# Patient Record
Sex: Female | Born: 2000 | Hispanic: Yes | Marital: Single | Smoking: Never smoker
Health system: Southern US, Community
[De-identification: ages and names within clinical notes are randomized; demographics above are authoritative.]

## PROBLEM LIST (undated history)

## (undated) DIAGNOSIS — K8020 Calculus of gallbladder without cholecystitis without obstruction: Secondary | ICD-10-CM

## (undated) DIAGNOSIS — J45909 Unspecified asthma, uncomplicated: Secondary | ICD-10-CM

## (undated) DIAGNOSIS — L309 Dermatitis, unspecified: Secondary | ICD-10-CM

## (undated) DIAGNOSIS — F419 Anxiety disorder, unspecified: Secondary | ICD-10-CM

## (undated) DIAGNOSIS — R109 Unspecified abdominal pain: Secondary | ICD-10-CM

## (undated) DIAGNOSIS — Z5189 Encounter for other specified aftercare: Secondary | ICD-10-CM

## (undated) HISTORY — DX: Unspecified abdominal pain: R10.9

## (undated) HISTORY — DX: Calculus of gallbladder without cholecystitis without obstruction: K80.20

## (undated) HISTORY — PX: TONSILLECTOMY: SUR1361

## (undated) HISTORY — PX: WISDOM TOOTH EXTRACTION: SHX21

---
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
## 2018-01-14 ENCOUNTER — Encounter (HOSPITAL_COMMUNITY): Payer: Self-pay | Admitting: Emergency Medicine

## 2018-01-14 ENCOUNTER — Ambulatory Visit (HOSPITAL_COMMUNITY)
Admission: EM | Admit: 2018-01-14 | Discharge: 2018-01-14 | Disposition: A | Discharge status: Home / Self Care | Payer: Medicaid Other | Attending: Family Medicine | Admitting: Family Medicine

## 2018-01-14 ENCOUNTER — Ambulatory Visit: Payer: No Typology Code available for payment source | Admitting: Allergy and Immunology

## 2018-01-14 ENCOUNTER — Ambulatory Visit (INDEPENDENT_AMBULATORY_CARE_PROVIDER_SITE_OTHER): Payer: Medicaid Other

## 2018-01-14 DIAGNOSIS — S6710XA Crushing injury of unspecified finger(s), initial encounter: Secondary | ICD-10-CM

## 2018-01-14 MED ORDER — IBUPROFEN 800 MG PO TABS: 800.0000 mg | ORAL_TABLET | Freq: Three times a day (TID) | ORAL | 0 refills | Status: DC | PRN

## 2018-01-14 NOTE — ED Provider Notes (Signed)
[facility]    CSN: [id number] Arrival date & time: 01/14/18  1946     History   Chief Complaint Chief Complaint  Patient presents with  . Finger Injury    HPI Ariel Mooney is a 17 y.o. female.   HPI  Third and fourth fingertips got crushed and a folding chair at the side of the pool.  Very painful.  Patient came here immediately for evaluation. Patient has fingers on ice.  Has not taken any medicine for pain.  She is otherwise healthy except for some allergies and asthma.  Past Medical History:  Diagnosis Date  . Abdominal pain, recurrent   . Anxiety   . Asthma   . Blood transfusion without reported diagnosis     Patient Active Problem List   Diagnosis Date Noted  . Other allergic rhinitis 05/22/2016  . Acute sinusitis 05/22/2016  . Moderate persistent asthma with exacerbation 01/07/2016  . Moderate persistent asthma without complication 01/07/2016  . Constipation due to slow transit 11/02/2014  . Dyspnea   . Asthma exacerbation 07/06/2014  . 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 Headache above the eye region 03/29/2011  . Lower abdominal pain     Past Surgical History:  Procedure Laterality Date  . TONSILLECTOMY    . TONSILLECTOMY  2010    OB History   None      Home Medications    Prior to Admission medications   Medication Sig Start Date End Date Taking? Authorizing Provider  albuterol (PROVENTIL HFA;VENTOLIN HFA) 108 (90 Base) MCG/ACT inhaler INHALE 2 PUFFS INTO THE LUNGS EVERY 4 HOURS AS NEEDED FOR WHEEZING OR SHORTNESS OF BREATH. 11/18/17   Bobbitt, Heywood Iles, MD  albuterol (PROVENTIL) (2.5 MG/3ML) 0.083% nebulizer solution Take 3 mLs (2.5 mg total) by nebulization every 6 (six) hours as needed for wheezing or shortness of breath. 12/17/16   Antony Madura, PA-C  cetirizine-pseudoephedrine (ZYRTEC-D) 5-120 MG tablet Take 1 tablet 2 (two) times daily by mouth. 06/28/17   Marcelyn Bruins, MD  ibuprofen  (ADVIL,MOTRIN) 800 MG tablet Take 1 tablet (800 mg total) by mouth every 8 (eight) hours as needed for moderate pain. 01/14/18   Eustace Moore, MD  mometasone-formoterol [facility]) 200-5 MCG/ACT AERO Inhale 2 puffs into the lungs 2 (two) times daily. 10/22/17   Bobbitt, Heywood Iles, MD  montelukast (SINGULAIR) 10 MG tablet Take 1 tablet (10 mg total) by mouth at bedtime. 10/22/17   Bobbitt, Heywood Iles, MD    Family History Family History  Problem Relation Age of Onset  . Asthma Sister   . Asthma Brother   . Cholelithiasis Sister   . Asthma Sister   . Asthma Sister   . Asthma Brother   . Asthma Brother   . Asthma Mother   . Ulcers Maternal Grandmother   . Cholelithiasis Maternal Grandmother   . Asthma Maternal Grandmother   . Allergic rhinitis Neg Hx   . Angioedema Neg Hx   . Eczema Neg Hx   . Immunodeficiency Neg Hx   . Urticaria Neg Hx     Social History Social History   Tobacco Use  . Smoking status: Never Smoker  . Smokeless tobacco: Never Used  Substance Use Topics  . Alcohol use: No  . Drug use: No     Allergies   Lac bovis; Lactose intolerance (gi); and Latex   Review of Systems Review of Systems  Constitutional: Negative for chills and fever.  HENT: Negative for  ear pain and sore throat.   Eyes: Negative for pain and visual disturbance.  Respiratory: Negative for cough and shortness of breath.   Cardiovascular: Negative for chest pain and palpitations.  Gastrointestinal: Negative for abdominal pain and vomiting.  Genitourinary: Negative for dysuria and hematuria.  Musculoskeletal: Negative for arthralgias and back pain.       Finger pain  Skin: Negative for color change and rash.  Neurological: Negative for seizures and syncope.  All other systems reviewed and are negative.    Physical Exam Triage Vital Signs ED Triage Vitals [01/14/18 2021]  Enc Vitals Group     BP 124/84     Pulse Rate 79     Resp 18     Temp 98.8 F (37.1 C)     Temp  Source Oral     SpO2 100 %     Weight      Height      Head Circumference      Peak Flow      Pain Score      Pain Loc      Pain Edu?      Excl. in GC?    No data found.  Updated Vital Signs BP 124/84 (BP Location: Right Arm)   Pulse 79   Temp 98.8 F (37.1 C) (Oral)   Resp 18   SpO2 100%   Visual Acuity Right Eye Distance:   Left Eye Distance:   Bilateral Distance:    Right Eye Near:   Left Eye Near:    Bilateral Near:     Physical Exam  Constitutional: She appears well-developed and well-nourished. No distress.  HENT:  Head: Normocephalic and atraumatic.  Mouth/Throat: Oropharynx is clear and moist.  Eyes: Pupils are equal, round, and reactive to light. Conjunctivae are normal.  Neck: Normal range of motion.  Cardiovascular: Normal rate.  Pulmonary/Chest: Effort normal. No respiratory distress.  Abdominal: Soft. She exhibits no distension.  Musculoskeletal: Normal range of motion. She exhibits no edema.  3rd and 4th fingertips have a minimal sub-ungual hematoma at the cuticle.  Very tender to palpation.  Patient is tearful and upset.  Sensation is normal over the tips.  She will allow minimal range of motion of these fingers at the DIP or PIP joints.  Neurological: She is alert.  Skin: Skin is warm and dry.     UC Treatments / Results  Labs (all labs ordered are listed, but only abnormal results are displayed) Labs Reviewed - No data to display  EKG None  Radiology Dg Hand Complete Left  Result Date: 01/14/2018 CLINICAL DATA:  Injury third and fourth fingers. EXAM: LEFT HAND - COMPLETE 3+ VIEW COMPARISON:  None. FINDINGS: There is no evidence of fracture or dislocation. There is no evidence of arthropathy or other focal bone abnormality. Soft tissues are unremarkable. IMPRESSION: Negative. Electronically Signed   By: Marlan Palau M.D.   On: 01/14/2018 20:50    Procedures Procedures (including critical care time)  Medications Ordered in  UC Medications - No data to display  Initial Impression / Assessment and Plan / UC Course  I have reviewed the triage vital signs and the nursing notes.  Pertinent labs & imaging results that were available during my care of the patient were reviewed by me and considered in my medical decision making (see chart for details).     Discussed possibility of nondisplaced fracture Final Clinical Impressions(s) / UC Diagnoses   Final diagnoses:  Crushing injury of distal  finger, initial encounter     Discharge Instructions     Ice Elevate Ibuprofen for pain See orthopedic in follow up    ED Prescriptions    Medication Sig Dispense Auth. Provider   ibuprofen (ADVIL,MOTRIN) 800 MG tablet Take 1 tablet (800 mg total) by mouth every 8 (eight) hours as needed for moderate pain. 30 tablet Eustace Moore, MD     Controlled Substance Prescriptions [place] Controlled Substance Registry consulted? Not Applicable   Eustace Moore, MD 01/14/18 2104

## 2018-01-14 NOTE — Discharge Instructions (Signed)
Ice Elevate Ibuprofen for pain See orthopedic in follow up

## 2018-01-14 NOTE — ED Triage Notes (Signed)
Pt sts slammed middle and right finger on left hand in chair at pool today

## 2018-01-15 ENCOUNTER — Ambulatory Visit (INDEPENDENT_AMBULATORY_CARE_PROVIDER_SITE_OTHER): Payer: Medicaid Other | Admitting: Orthopaedic Surgery

## 2018-01-15 ENCOUNTER — Encounter (INDEPENDENT_AMBULATORY_CARE_PROVIDER_SITE_OTHER): Payer: Self-pay | Admitting: Orthopaedic Surgery

## 2018-01-15 DIAGNOSIS — M79642 Pain in left hand: Secondary | ICD-10-CM

## 2018-01-15 MED ORDER — MELOXICAM 7.5 MG PO TABS: 7.5000 mg | ORAL_TABLET | Freq: Every day | ORAL | 0 refills | Status: DC | PRN

## 2018-01-15 MED ORDER — MUPIROCIN 2 % EX OINT: TOPICAL_OINTMENT | CUTANEOUS | 0 refills | Status: DC

## 2018-01-15 NOTE — Progress Notes (Signed)
Office Visit Note   Patient: Ariel Mooney           Date of Birth: [birth year]           MRN: [id number] Visit Date: 01/15/2018              Requested by: No referring provider defined for this encounter. PCP: Patient, No Pcp Per   Assessment & Plan: Visit Diagnoses:  1. Pain of left hand     Plan: Impression is small nondisplaced fracture to the distal phalanx of the left long finger.  This point, we will reapply the finger splint and buddy tape the long and ring fingers together.  We will ensure that the PIP joints are free and mobile.  We will also address this with mupirocin over the hematoma.  Prescriptions for mupirocin as well as Mobic were called into her pharmacy.  She will do dressing changes twice a day.  She will follow-up with [place] in 3 weeks time for repeat evaluation.  Call with concerns or questions in the meantime.  Follow-Up Instructions: Return in about 3 weeks (around 02/05/2018).   Orders:  No orders of the defined types were placed in this encounter.  Meds ordered this encounter  Medications  . meloxicam (MOBIC) 7.5 MG tablet    Sig: Take 1 tablet (7.5 mg total) by mouth daily as needed for pain.    Dispense:  30 tablet    Refill:  0  . mupirocin ointment (BACTROBAN) 2 %    Sig: Apply to affected area 2 times daily    Dispense:  22 g    Refill:  0      Procedures: No procedures performed   Clinical Data: No additional findings.   Subjective: Chief Complaint  Patient presents with  . Left Hand - Pain, Injury    Long and ring fingers - crush injury 01/14/18    HPI patient is a pleasant 17 year old girl who comes in today with a new injury to her left hand.  Yesterday while at the pool, she was reclining her chair when her left hand got trapped between the metal lever.  She had immediate pain and was seen at an urgent care setting.  X-rays were obtained which showed a small nondisplaced fracture to the distal phalanx of the long finger.  She  was placed in a splint and buddy taped.  She has been icing and elevating for swelling.  She comes in today for further evaluation treatment recommendation.  She does note numbness to the entire ring finger and tingling to the distal aspect of the long finger.  She also notes some bloody drainage to the long finger nail bed this morning.  No fevers, chills or any other systemic symptoms.  Review of Systems as detailed in HPI.  All others reviewed and are negative.   Objective: Vital Signs: There were no vitals taken for this visit.  Physical Exam well-developed well-nourished female no acute distress.  Alert and oriented x3.  Ortho Exam examination of her left hand shows moderate swelling to the long and ring fingers.  She does have a small subungual hematoma to the long finger.  No signs of infection.  She is able to fully extend both fingers.  Flexor tendons are intact although she is somewhat limited by pain.  Full sensation to both fingers.  Specialty Comments:  No specialty comments available.  Imaging: Dg Hand Complete Left  Result Date: 01/14/2018 CLINICAL DATA:  Injury third  and fourth fingers. EXAM: LEFT HAND - COMPLETE 3+ VIEW COMPARISON:  None. FINDINGS: There is no evidence of fracture or dislocation. There is no evidence of arthropathy or other focal bone abnormality. Soft tissues are unremarkable. IMPRESSION: Negative. Electronically Signed   By: Marlan Palau M.D.   On: 01/14/2018 20:50     PMFS History: Patient Active Problem List   Diagnosis Date Noted  . Other allergic rhinitis 05/22/2016  . Acute sinusitis 05/22/2016  . Moderate persistent asthma with exacerbation 01/07/2016  . Moderate persistent asthma without complication 01/07/2016  . Constipation due to slow transit 11/02/2014  . Dyspnea   . Asthma exacerbation 07/06/2014  . 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 Headache above the eye region 03/29/2011  . Lower abdominal pain    Past Medical  History:  Diagnosis Date  . Abdominal pain, recurrent   . Anxiety   . Asthma   . Blood transfusion without reported diagnosis     Family History  Problem Relation Age of Onset  . Asthma Sister   . Asthma Brother   . Cholelithiasis Sister   . Asthma Sister   . Asthma Sister   . Asthma Brother   . Asthma Brother   . Asthma Mother   . Ulcers Maternal Grandmother   . Cholelithiasis Maternal Grandmother   . Asthma Maternal Grandmother   . Allergic rhinitis Neg Hx   . Angioedema Neg Hx   . Eczema Neg Hx   . Immunodeficiency Neg Hx   . Urticaria Neg Hx     Past Surgical History:  Procedure Laterality Date  . TONSILLECTOMY    . TONSILLECTOMY  2010   Social History   Occupational History  . Not on file  Tobacco Use  . Smoking status: Never Smoker  . Smokeless tobacco: Never Used  Substance and Sexual Activity  . Alcohol use: No  . Drug use: No  . Sexual activity: Never

## 2018-01-16 ENCOUNTER — Ambulatory Visit: Payer: Medicaid Other | Admitting: Allergy & Immunology

## 2018-01-16 DIAGNOSIS — J309 Allergic rhinitis, unspecified: Secondary | ICD-10-CM

## 2018-01-28 ENCOUNTER — Ambulatory Visit: Payer: No Typology Code available for payment source | Admitting: Allergy and Immunology

## 2018-02-06 ENCOUNTER — Ambulatory Visit (INDEPENDENT_AMBULATORY_CARE_PROVIDER_SITE_OTHER): Payer: Medicaid Other | Admitting: Orthopaedic Surgery

## 2018-03-07 ENCOUNTER — Other Ambulatory Visit: Payer: Self-pay | Admitting: Allergy and Immunology

## 2018-03-07 DIAGNOSIS — J4550 Severe persistent asthma, uncomplicated: Secondary | ICD-10-CM

## 2018-03-10 NOTE — Telephone Encounter (Signed)
Courtesy refill  

## 2018-04-03 ENCOUNTER — Other Ambulatory Visit: Payer: Self-pay | Admitting: Allergy and Immunology

## 2018-04-03 DIAGNOSIS — J4550 Severe persistent asthma, uncomplicated: Secondary | ICD-10-CM

## 2018-04-04 NOTE — Telephone Encounter (Signed)
Courtesy refill  

## 2018-05-19 ENCOUNTER — Other Ambulatory Visit: Payer: Self-pay | Admitting: Allergy and Immunology

## 2018-05-19 DIAGNOSIS — J4550 Severe persistent asthma, uncomplicated: Secondary | ICD-10-CM

## 2018-06-17 ENCOUNTER — Other Ambulatory Visit: Payer: Self-pay | Admitting: Allergy and Immunology

## 2018-06-17 DIAGNOSIS — J4550 Severe persistent asthma, uncomplicated: Secondary | ICD-10-CM

## 2018-06-26 ENCOUNTER — Encounter: Payer: Self-pay | Admitting: Allergy

## 2018-06-26 ENCOUNTER — Ambulatory Visit (INDEPENDENT_AMBULATORY_CARE_PROVIDER_SITE_OTHER): Payer: Medicaid Other | Admitting: Allergy

## 2018-06-26 VITALS — BP 114/68 | HR 90 | Ht 61.5 in | Wt 216.0 lb

## 2018-06-26 DIAGNOSIS — J3089 Other allergic rhinitis: Secondary | ICD-10-CM | POA: Diagnosis not present

## 2018-06-26 DIAGNOSIS — J4540 Moderate persistent asthma, uncomplicated: Secondary | ICD-10-CM

## 2018-06-26 MED ORDER — ALBUTEROL SULFATE HFA 108 (90 BASE) MCG/ACT IN AERS: INHALATION_SPRAY | RESPIRATORY_TRACT | 1 refills | Status: DC

## 2018-06-26 MED ORDER — TIOTROPIUM BROMIDE MONOHYDRATE 1.25 MCG/ACT IN AERS: 2.0000 | INHALATION_SPRAY | Freq: Every day | RESPIRATORY_TRACT | 5 refills | Status: DC

## 2018-06-26 MED ORDER — MONTELUKAST SODIUM 10 MG PO TABS: 10.0000 mg | ORAL_TABLET | Freq: Every day | ORAL | 5 refills | Status: DC

## 2018-06-26 NOTE — Assessment & Plan Note (Deleted)
Having slight flare the last few months.   Today's spirometry was normal but slightly worse than previous.  Continue Dulera 200-5 g, 2 inhalations via spacer device twice daily.  Start Spiriva 1.25 2 puffs daily and demonstrated proper use. Sample given.  Start Singulair 10mg  daily.  May use albuterol rescue inhaler 2 puffs or nebulizer every 4 to 6 hours as needed for shortness of breath, chest tightness, coughing, and wheezing. May use albuterol rescue inhaler 2 puffs 5 to 15 minutes prior to strenuous physical activities.

## 2018-06-26 NOTE — Assessment & Plan Note (Signed)
Well-controlled with no medications.   May use over the counter antihistamines such as Zyrtec (cetirizine), Claritin (loratadine), Allegra (fexofenadine), or Xyzal (levocetirizine) daily as needed.

## 2018-06-26 NOTE — Assessment & Plan Note (Signed)
Having slight flare the last few months.   Today's spirometry was normal but slightly worse than previous.  Continue Dulera 200-5 g, 2 inhalations via spacer device twice daily.  Start Spiriva 1.25 2 puffs daily and demonstrated proper use. Sample given.  Start Singulair 10mg daily.  May use albuterol rescue inhaler 2 puffs or nebulizer every 4 to 6 hours as needed for shortness of breath, chest tightness, coughing, and wheezing. May use albuterol rescue inhaler 2 puffs 5 to 15 minutes prior to strenuous physical activities. 

## 2018-06-26 NOTE — Progress Notes (Signed)
Follow Up Note  RE: Ariel Mooney MRN: [id number] DOB: Jun 01, 2001 Date of Office Visit: 06/26/2018  Referring provider: No ref. provider found Primary care provider: Patient, No Pcp Per  Chief Complaint: Asthma  History of Present Illness: I had the pleasure of seeing Ariel Mooney for a follow up visit at the [facility] on 06/26/2018. She is a 17 y.o. female, who is being followed for asthma and allergic rhinitis. Today she is here for regular follow up visit and new complaints of worsening asthma symptoms. Her previous allergy office visit was on 10/22/2017 with Dr. Nunzio Cobbs.   Moderate persistent asthma Her asthma has been acting up for the past 2-3 months. Patient started on OCP and gained about 20 lbs or so the last 6 months which may be contributing to her breathing issues she believes.  She has been having issues with SOB at rest and nocturnal awakenings a few times per week. Currently on Dulera 200 2 puffs twice a day with spacer.  Using albuterol about 1-2 times a day with good benefit. No ER/urgent care visit or oral prednisone use since the last visit.   Other allergic rhinitis Only taking zyrtec as needed and not using nasal spray. Minimal symptoms.   Assessment and Plan: Ariel Mooney is a 17 y.o. female with: Moderate persistent asthma without complication Having slight flare the last few months.   Today's spirometry was normal but slightly worse than previous.  Continue Dulera 200-5 g, 2 inhalations via spacer device twice daily.  Start Spiriva 1.25 2 puffs daily and demonstrated proper use. Sample given.  Start Singulair 10mg  daily.  May use albuterol rescue inhaler 2 puffs or nebulizer every 4 to 6 hours as needed for shortness of breath, chest tightness, coughing, and wheezing. May use albuterol rescue inhaler 2 puffs 5 to 15 minutes prior to strenuous physical activities.  Other allergic rhinitis Well-controlled with no medications.    May use over the counter antihistamines such as Zyrtec (cetirizine), Claritin (loratadine), Allegra (fexofenadine), or Xyzal (levocetirizine) daily as needed.  Return in about 4 weeks (around 07/24/2018).  Meds ordered this encounter  Medications  . albuterol (PROVENTIL HFA;VENTOLIN HFA) 108 (90 Base) MCG/ACT inhaler    Sig: INHALE 2 PUFFS INTO THE LUNGS EVERY 4 HOURS AS NEEDED FOR WHEEZING OR SHORTNESS OF BREATH    Dispense:  18 g    Refill:  1    Last fill needs appt  . montelukast (SINGULAIR) 10 MG tablet    Sig: Take 1 tablet (10 mg total) by mouth at bedtime.    Dispense:  34 tablet    Refill:  5  . Tiotropium Bromide Monohydrate (SPIRIVA RESPIMAT) 1.25 MCG/ACT AERS    Sig: Inhale 2 puffs into the lungs daily.    Dispense:  4 g    Refill:  5   Diagnostics: Spirometry:  Tracings reviewed. Her effort: Good reproducible efforts. FVC: 2.98L FEV1: 2.26L, 71% predicted FEV1/FVC ratio: 76% Interpretation: Spirometry consistent with normal pattern but slightly worse than previous. Please see scanned spirometry results for details.  Medication List:  Current Outpatient Medications  Medication Sig Dispense Refill  . albuterol (PROVENTIL HFA;VENTOLIN HFA) 108 (90 Base) MCG/ACT inhaler INHALE 2 PUFFS INTO THE LUNGS EVERY 4 HOURS AS NEEDED FOR WHEEZING OR SHORTNESS OF BREATH 18 g 1  . ibuprofen (ADVIL,MOTRIN) 800 MG tablet Take 1 tablet (800 mg total) by mouth every 8 (eight) hours as needed for moderate pain. 30 tablet 0  .  LO LOESTRIN FE 1 MG-10 MCG / 10 MCG tablet TK 1 T PO DAILY  0  . meloxicam (MOBIC) 7.5 MG tablet Take 1 tablet (7.5 mg total) by mouth daily as needed for pain. 30 tablet 0  . mometasone-formoterol (DULERA) 200-5 MCG/ACT AERO Inhale into the lungs.    . mupirocin ointment (BACTROBAN) 2 % Apply to affected area 2 times daily 22 g 0  . montelukast (SINGULAIR) 10 MG tablet Take 1 tablet (10 mg total) by mouth at bedtime. 34 tablet 5  . Tiotropium Bromide  Monohydrate (SPIRIVA RESPIMAT) 1.25 MCG/ACT AERS Inhale 2 puffs into the lungs daily. 4 g 5   Current Facility-Administered Medications  Medication Dose Route Frequency Provider Last Rate Last Dose  . ipratropium-albuterol (DUONEB) 0.5-2.5 (3) MG/3ML nebulizer solution 3 mL  3 mL Nebulization Q6H Bobbitt, Heywood Iles, MD   3 mL at 05/22/16 1000   Allergies: Allergies  Allergen Reactions  . Lac Bovis Hives  . Lactose Intolerance (Gi) Hives and Shortness Of Breath  . Latex Shortness Of Breath and Rash  . Other Hives   I reviewed her past medical history, social history, family history, and environmental history and no significant changes have been reported from previous visit on 10/22/2017.  Review of Systems  Constitutional: Negative for appetite change, chills, fever and unexpected weight change.  HENT: Negative for congestion and rhinorrhea.   Eyes: Negative for itching.  Respiratory: Positive for shortness of breath. Negative for cough, chest tightness and wheezing.   Gastrointestinal: Negative for abdominal pain.  Skin: Negative for rash.  Allergic/Immunologic: Positive for environmental allergies.  Neurological: Negative for headaches.   Objective: BP 114/68 (BP Location: Left Arm, Patient Position: Sitting, Cuff Size: Normal)   Pulse 90   Ht 5' 1.5" (1.562 m)   Wt 216 lb (98 kg)   SpO2 97%   BMI 40.15 kg/m  Body mass index is 40.15 kg/m. Physical Exam  Constitutional: She is oriented to person, place, and time. She appears well-developed and well-nourished.  HENT:  Head: Normocephalic and atraumatic.  Right Ear: External ear normal.  Left Ear: External ear normal.  Nose: Nose normal.  Mouth/Throat: Oropharynx is clear and moist.  Eyes: Conjunctivae and EOM are normal.  Neck: Neck supple.  Cardiovascular: Normal rate, regular rhythm and normal heart sounds. Exam reveals no gallop and no friction rub.  No murmur heard. Pulmonary/Chest: Effort normal and breath sounds  normal. She has no wheezes. She has no rales.  Neurological: She is alert and oriented to person, place, and time.  Skin: Skin is warm. No rash noted.  Psychiatric: She has a normal mood and affect. Her behavior is normal.  Nursing note and vitals reviewed.  Previous notes and tests were reviewed. The plan was reviewed with the patient/family, and all questions/concerned were addressed.  It was my pleasure to see Ariel Mooney today and participate in her care. Please feel free to contact me with any questions or concerns.  Sincerely,  Wyline Mood, DO Allergy & Immunology  [facility] office: [phone] [facility] office:[phone]

## 2018-06-26 NOTE — Patient Instructions (Addendum)
   Continue Dulera 200 2 puffs twice a day with spacer and rinse mouth afterwards.  Start Spiriva 2 puffs daily.  Start Singulair 10mg  daily.  May use albuterol rescue inhaler 2 puffs or nebulizer every 4 to 6 hours as needed for shortness of breath, chest tightness, coughing, and wheezing. May use albuterol rescue inhaler 2 puffs 5 to 15 minutes prior to strenuous physical activities.  May use over the counter antihistamines such as Zyrtec (cetirizine), Claritin (loratadine), Allegra (fexofenadine), or Xyzal (levocetirizine) daily as needed.  Follow up in 4 weeks.

## 2018-07-03 ENCOUNTER — Encounter (HOSPITAL_COMMUNITY): Payer: Self-pay | Admitting: Emergency Medicine

## 2018-07-03 ENCOUNTER — Ambulatory Visit (HOSPITAL_COMMUNITY)
Admission: EM | Admit: 2018-07-03 | Discharge: 2018-07-03 | Disposition: A | Discharge status: Home / Self Care | Payer: Medicaid Other | Attending: Family Medicine | Admitting: Family Medicine

## 2018-07-03 DIAGNOSIS — M542 Cervicalgia: Secondary | ICD-10-CM

## 2018-07-03 DIAGNOSIS — M62838 Other muscle spasm: Secondary | ICD-10-CM | POA: Diagnosis not present

## 2018-07-03 DIAGNOSIS — M546 Pain in thoracic spine: Secondary | ICD-10-CM

## 2018-07-03 DIAGNOSIS — G8929 Other chronic pain: Secondary | ICD-10-CM

## 2018-07-03 MED ORDER — CYCLOBENZAPRINE HCL 10 MG PO TABS: 10.0000 mg | ORAL_TABLET | Freq: Every day | ORAL | 0 refills | Status: DC

## 2018-07-03 MED ORDER — NAPROXEN 375 MG PO TABS: 375.0000 mg | ORAL_TABLET | Freq: Two times a day (BID) | ORAL | 0 refills | Status: DC

## 2018-07-03 NOTE — ED Provider Notes (Signed)
[facility]ENTER   [id number] 07/03/18 Arrival Time: 1727  CC: neck and back pain  SUBJECTIVE: History from: patient. Ariel Mooney is a 17 y.o. female complains of mid back pain x 4 months and neck pain x 2 weeks.  Denies a precipitating event or specific injury.  Denies recent MVA or strenuous upper body exercises.  Localizes the pain to mid back and right side of neck.  Describes the pain as constant and "10"/10.  Has NOT tried OTC medications.  Symptoms are made worse with sitting a desk.  Denies similar symptoms in the past.  Denies fever, chills, erythema, ecchymosis, effusion, weakness, numbness and tingling, bowel or bladder incontinence, or saddle paresthesias.        ROS: As per HPI.  Past Medical History:  Diagnosis Date  . Abdominal pain, recurrent   . Anxiety   . Asthma   . Blood transfusion without reported diagnosis    Past Surgical History:  Procedure Laterality Date  . TONSILLECTOMY    . TONSILLECTOMY  2010   Allergies  Allergen Reactions  . Lac Bovis Hives  . Lactose Intolerance (Gi) Hives and Shortness Of Breath  . Latex Shortness Of Breath and Rash  . Other Hives   No current facility-administered medications on file prior to encounter.    Current Outpatient Medications on File Prior to Encounter  Medication Sig Dispense Refill  . albuterol (PROVENTIL HFA;VENTOLIN HFA) 108 (90 Base) MCG/ACT inhaler INHALE 2 PUFFS INTO THE LUNGS EVERY 4 HOURS AS NEEDED FOR WHEEZING OR SHORTNESS OF BREATH 18 g 1  . LO LOESTRIN FE 1 MG-10 MCG / 10 MCG tablet TK 1 T PO DAILY  0  . mometasone-formoterol (DULERA) 200-5 MCG/ACT AERO Inhale into the lungs.    . montelukast (SINGULAIR) 10 MG tablet Take 1 tablet (10 mg total) by mouth at bedtime. 34 tablet 5  . mupirocin ointment (BACTROBAN) 2 % Apply to affected area 2 times daily 22 g 0  . Tiotropium Bromide Monohydrate (SPIRIVA RESPIMAT) 1.25 MCG/ACT AERS Inhale 2 puffs into the lungs daily. 4 g 5   Social History     Socioeconomic History  . Marital status: Single    Spouse name: Not on file  . Number of children: Not on file  . Years of education: Not on file  . Highest education level: Not on file  Occupational History  . Not on file  Social Needs  . Financial resource strain: Not on file  . Food insecurity:    Worry: Not on file    Inability: Not on file  . Transportation needs:    Medical: Not on file    Non-medical: Not on file  Tobacco Use  . Smoking status: Never Smoker  . Smokeless tobacco: Never Used  Substance and Sexual Activity  . Alcohol use: No  . Drug use: No  . Sexual activity: Never  Lifestyle  . Physical activity:    Days per week: Not on file    Minutes per session: Not on file  . Stress: Not on file  Relationships  . Social connections:    Talks on phone: Not on file    Gets together: Not on file    Attends religious service: Not on file    Active member of club or organization: Not on file    Attends meetings of clubs or organizations: Not on file    Relationship status: Not on file  . Intimate partner violence:    Fear of  current or ex partner: Not on file    Emotionally abused: Not on file    Physically abused: Not on file    Forced sexual activity: Not on file  Other Topics Concern  . Not on file  Social History Narrative   Lives at home with mother and brother attends [employer] is in 10th grade..   Family History  Problem Relation Age of Onset  . Asthma Sister   . Asthma Brother   . Cholelithiasis Sister   . Asthma Sister   . Asthma Sister   . Asthma Brother   . Asthma Brother   . Asthma Mother   . Ulcers Maternal Grandmother   . Cholelithiasis Maternal Grandmother   . Asthma Maternal Grandmother   . Allergic rhinitis Neg Hx   . Angioedema Neg Hx   . Eczema Neg Hx   . Immunodeficiency Neg Hx   . Urticaria Neg Hx     OBJECTIVE:  Vitals:   07/03/18 1827  BP: 120/72  Pulse: 80  Resp: 16  Temp: 98.3 F (36.8 C)   TempSrc: Oral  SpO2: 100%    General appearance: AOx3; in no acute distress.  Head: NCAT Lungs: CTA bilaterally Heart: RRR.  Clear S1 and S2 without murmur, gallops, or rubs.  Radial pulses 2+ bilaterally. Musculoskeletal: Neck and back Inspection: Skin warm, dry, clear and intact without obvious erythema, effusion, or ecchymosis.  Palpation: Diffusely tender about the superior edge of the right trapezius, right cervical musculature; mild mid back tenderness ROM: FROM active and passive Strength: 5/5 shld abduction, 5/5 shld adduction, 5/5 elbow flexion, 5/5 elbow extension, 5/5 grip strength, 5/5 hip flexion, 5/5 knee abduction, 5/5 knee adduction, 5/5 knee flexion, 5/5 knee extension, 5/5 dorsiflexion, 5/5 plantar flexion Skin: warm and dry Neurologic: Ambulates without difficulty; Sensation intact about the upper/ lower extremities Psychological: alert and cooperative; normal mood and affect  ASSESSMENT & PLAN:  1. Trapezius muscle spasm   2. Neck pain   3. Chronic thoracic back pain, unspecified back pain laterality    Meds ordered this encounter  Medications  . naproxen (NAPROSYN) 375 MG tablet    Sig: Take 1 tablet (375 mg total) by mouth 2 (two) times daily.    Dispense:  20 tablet    Refill:  0    Order Specific Question:   Supervising Provider    Answer:   Isa RankinMURRAY, LAURA WILSON [phone][[id number]]  . cyclobenzaprine (FLEXERIL) 10 MG tablet    Sig: Take 1 tablet (10 mg total) by mouth at bedtime.    Dispense:  15 tablet    Refill:  0    Order Specific Question:   Supervising Provider    Answer:   Isa RankinMURRAY, LAURA WILSON [[id number]]   Continue conservative management of rest, ice, and gentle stretches Take naproxen as needed for pain relief (may cause abdominal discomfort, ulcers, and GI bleeds avoid taking with other NSAIDs) Take cyclobenzaprine at nighttime for symptomatic relief. Avoid driving or operating heavy machinery while using medication. Follow up with PCP or [facility] if symptoms persist Return or go to the ER if you have any new or worsening symptoms (fever, chills, chest pain, abdominal pain, changes in bowel or bladder habits, pain radiating into lower legs, etc...)   Reviewed expectations re: course of current medical issues. Questions answered. Outlined signs and symptoms indicating need for more acute intervention. Patient verbalized understanding. After Visit Summary given.    Rennis HardingWurst, Hadriel Northup, PA-C 07/03/18  1925  

## 2018-07-03 NOTE — ED Triage Notes (Signed)
Pt c/o neck and back pain x4 months. States her neck pain has been for two weeks.

## 2018-07-03 NOTE — Discharge Instructions (Addendum)
Continue conservative management of rest, ice, and gentle stretches Take naproxen as needed for pain relief (may cause abdominal discomfort, ulcers, and GI bleeds avoid taking with other NSAIDs) Take cyclobenzaprine at nighttime for symptomatic relief. Avoid driving or operating heavy machinery while using medication. Follow up with PCP or [facility] OR [facility] if symptoms persist Return or go to the ER if you have any new or worsening symptoms (fever, chills, chest pain, abdominal pain, changes in bowel or bladder habits, pain radiating into lower legs, etc...)

## 2018-07-25 ENCOUNTER — Ambulatory Visit: Payer: Medicaid Other | Admitting: Allergy

## 2018-08-08 ENCOUNTER — Encounter: Payer: Self-pay | Admitting: Allergy

## 2018-08-08 ENCOUNTER — Ambulatory Visit (INDEPENDENT_AMBULATORY_CARE_PROVIDER_SITE_OTHER): Payer: Medicaid Other | Admitting: Allergy

## 2018-08-08 VITALS — BP 102/78 | HR 97 | Temp 98.5°F | Resp 18 | Ht 61.0 in | Wt 192.0 lb

## 2018-08-08 DIAGNOSIS — J4540 Moderate persistent asthma, uncomplicated: Secondary | ICD-10-CM

## 2018-08-08 DIAGNOSIS — J3089 Other allergic rhinitis: Secondary | ICD-10-CM | POA: Diagnosis not present

## 2018-08-08 MED ORDER — MOMETASONE FURO-FORMOTEROL FUM 200-5 MCG/ACT IN AERO: 2.0000 | INHALATION_SPRAY | Freq: Two times a day (BID) | RESPIRATORY_TRACT | 2 refills | Status: DC

## 2018-08-08 NOTE — Patient Instructions (Addendum)
   Resume Dulera 200 2 puffs twice a day with spacer and rinse mouth afterwards.  Continue Spiriva 2 puffs daily.  Continue Singulair 10mg  daily.  May use albuterol rescue inhaler 2 puffs or nebulizer every 4 to 6 hours as needed for shortness of breath, chest tightness, coughing, and wheezing. May use albuterol rescue inhaler 2 puffs 5 to 15 minutes prior to strenuous physical activities.  May use over the counter antihistamines such as Zyrtec (cetirizine), Claritin (loratadine), Allegra (fexofenadine), or Xyzal (levocetirizine) daily as needed.  May need to initiate biologic asthma medication for improved control if above regimen is not effective enough.    Follow up in 8 weeks.

## 2018-08-08 NOTE — Progress Notes (Signed)
Follow-up Note  RE: Ariel Mooney MRN: [id number] DOB: 16-Aug-2001 Date of Office Visit: 08/08/2018   History of present illness: Ariel Mooney is a 17 y.o. female presenting today for follow-up of asthma and allergic rhinitis.  She was last seen in the office on June 26, 2018 by Dr. Selena BattenKim.  At this visit it was recommended that she continue her Dulera and start Spiriva as well as Singulair.  She states she did not understand that she needed to continue the [facility]Dulera and that it was not refilled for her so she thought she was supposed to stop this.  So she is just been doing Spiriva 2 puffs daily and singular daily.  She states she still having shortness of breath, wheezing at night as well as nighttime awakenings a couple of nights a week.  She is using her albuterol very often.  Since her last visit she is actually finished a whole albuterol inhaler in that time span. She states the following day after her last visit she did develop a URI that was treated with an antibiotic course.  She has recovered from this illness. She states she is still concerned about her weight and is being set up to follow-up with weight management.  She is on OCPs and is wondering if this is also adding to her weight.  Review of systems: Review of Systems  Constitutional: Negative for chills, fever, malaise/fatigue and weight loss.  HENT: Negative for congestion, ear discharge, nosebleeds and sore throat.   Eyes: Negative for pain, discharge and redness.  Respiratory: Positive for cough, shortness of breath and wheezing.   Cardiovascular: Negative for chest pain.  Gastrointestinal: Negative for abdominal pain, diarrhea, heartburn, nausea and vomiting.  Musculoskeletal: Negative for joint pain.  Skin: Negative for itching and rash.  Neurological: Negative for headaches.    All other systems negative unless noted above in HPI  Past medical/social/surgical/family history have been reviewed and are  unchanged unless specifically indicated below.  No changes  Medication List: Allergies as of 08/08/2018      Reactions   Lac Bovis Hives   Lactose Intolerance (gi) Hives, Shortness Of Breath   Latex Shortness Of Breath, Rash   Other Hives      Medication List       Accurate as of August 08, 2018  4:47 PM. Always use your most recent med list.        albuterol 108 (90 Base) MCG/ACT inhaler Commonly known as:  PROVENTIL HFA;VENTOLIN HFA INHALE 2 PUFFS INTO THE LUNGS EVERY 4 HOURS AS NEEDED FOR WHEEZING OR SHORTNESS OF BREATH   cyclobenzaprine 10 MG tablet Commonly known as:  FLEXERIL Take 1 tablet (10 mg total) by mouth at bedtime.   LO LOESTRIN FE 1 MG-10 MCG / 10 MCG tablet Generic drug:  Norethindrone-Ethinyl Estradiol-Fe Biphas TK 1 T PO DAILY   mometasone-formoterol 200-5 MCG/ACT Aero Commonly known as:  DULERA Inhale 2 puffs into the lungs 2 (two) times daily. Inhale into the lungs.   montelukast 10 MG tablet Commonly known as:  SINGULAIR Take 1 tablet (10 mg total) by mouth at bedtime.   naproxen 375 MG tablet Commonly known as:  NAPROSYN Take 1 tablet (375 mg total) by mouth 2 (two) times daily.   Tiotropium Bromide Monohydrate 1.25 MCG/ACT Aers Commonly known as:  SPIRIVA RESPIMAT Inhale 2 puffs into the lungs daily.       Known medication allergies: Allergies  Allergen Reactions  . Lac Bovis Hives  .  Lactose Intolerance (Gi) Hives and Shortness Of Breath  . Latex Shortness Of Breath and Rash  . Other Hives     Physical examination: Blood pressure 102/78, pulse 97, temperature 98.5 F (36.9 C), temperature source Oral, resp. rate 18, height 5\' 1"  (1.549 m), weight 192 lb (87.1 kg), SpO2 95 %.  General: Alert, interactive, in no acute distress. HEENT: PERRLA, TMs pearly gray, turbinates minimally edematous without discharge, post-pharynx non erythematous. Neck: Supple without lymphadenopathy. Lungs: Clear to auscultation without wheezing,  rhonchi or rales. {no increased work of breathing. CV: Normal S1, S2 without murmurs. Abdomen: Nondistended, nontender. Skin: Warm and dry, without lesions or rashes. Extremities:  No clubbing, cyanosis or edema. Neuro:   Grossly intact.  Diagnositics/Labs:  Spirometry: FEV1: 2.67L 86%, FVC: 3.26L 93%, ratio consistent with nonobstructive pattern  Assessment and plan: Moderate persistent asthma-not well controlled.  She now understands that she needs to take her Dulera with the Spiriva as well as the Singulair.  I am hopeful that this combination of medications will improve her symptom control.  If it does not we have discussed that she may benefit from starting a biologic asthma medication. Allergic rhinitis - continue current regimen    Resume Dulera 200 2 puffs twice a day with spacer and rinse mouth afterwards.  Continue Spiriva 2 puffs daily.  Continue Singulair 10mg  daily.  May use albuterol rescue inhaler 2 puffs or nebulizer every 4 to 6 hours as needed for shortness of breath, chest tightness, coughing, and wheezing. May use albuterol rescue inhaler 2 puffs 5 to 15 minutes prior to strenuous physical activities.  May use over the counter antihistamines such as Zyrtec (cetirizine), Claritin (loratadine), Allegra (fexofenadine), or Xyzal (levocetirizine) daily as needed.  May need to initiate biologic asthma medication for improved control if above regimen is not effective enough.    Follow up in 8 weeks.  I appreciate the opportunity to take part in Ariel Mooney's care. Please do not hesitate to contact me with questions.  Sincerely,   Margo AyeShaylar Jerren Flinchbaugh, MD Allergy/Immunology [facility]

## 2018-08-15 ENCOUNTER — Encounter (HOSPITAL_COMMUNITY): Payer: Self-pay | Admitting: *Deleted

## 2018-08-15 ENCOUNTER — Other Ambulatory Visit: Payer: Self-pay

## 2018-08-15 ENCOUNTER — Emergency Department (HOSPITAL_COMMUNITY)
Admission: EM | Admit: 2018-08-15 | Discharge: 2018-08-16 | Disposition: A | Discharge status: Home / Self Care | Payer: Medicaid Other | Attending: Emergency Medicine | Admitting: Emergency Medicine

## 2018-08-15 DIAGNOSIS — Z91040 Latex allergy status: Secondary | ICD-10-CM | POA: Insufficient documentation

## 2018-08-15 DIAGNOSIS — R112 Nausea with vomiting, unspecified: Secondary | ICD-10-CM | POA: Diagnosis present

## 2018-08-15 DIAGNOSIS — R42 Dizziness and giddiness: Secondary | ICD-10-CM | POA: Diagnosis not present

## 2018-08-15 DIAGNOSIS — J45909 Unspecified asthma, uncomplicated: Secondary | ICD-10-CM | POA: Insufficient documentation

## 2018-08-15 DIAGNOSIS — Z79899 Other long term (current) drug therapy: Secondary | ICD-10-CM | POA: Diagnosis not present

## 2018-08-15 LAB — COMPREHENSIVE METABOLIC PANEL
ALBUMIN: 4.6 g/dL (ref 3.5–5.0)
ALK PHOS: 69 U/L (ref 38–126)
ALT: 34 U/L (ref 0–44)
ANION GAP: 11 (ref 5–15)
AST: 25 U/L (ref 15–41)
BUN: 13 mg/dL (ref 6–20)
CHLORIDE: 102 mmol/L (ref 98–111)
CO2: 23 mmol/L (ref 22–32)
Calcium: 9.3 mg/dL (ref 8.9–10.3)
Creatinine, Ser: 0.62 mg/dL (ref 0.44–1.00)
GFR calc non Af Amer: >60 mL/min (ref >60)
Glucose, Bld: 106 mg/dL — ABNORMAL HIGH (ref 70–99)
POTASSIUM: 4 mmol/L (ref 3.5–5.1)
SODIUM: 136 mmol/L (ref 135–145)
Total Bilirubin: 0.6 mg/dL (ref 0.3–1.2)
Total Protein: 8.6 g/dL — ABNORMAL HIGH (ref 6.5–8.1)

## 2018-08-15 LAB — URINALYSIS, ROUTINE W REFLEX MICROSCOPIC
BILIRUBIN URINE: NEGATIVE
GLUCOSE, UA: NEGATIVE mg/dL
Hgb urine dipstick: NEGATIVE
Ketones, ur: 80 mg/dL — AB
Nitrite: NEGATIVE
PH: 6 (ref 5.0–8.0)
Protein, ur: NEGATIVE mg/dL
Specific Gravity, Urine: 1.026 (ref 1.005–1.030)

## 2018-08-15 LAB — POC URINE PREG, ED: Preg Test, Ur: NEGATIVE

## 2018-08-15 LAB — CBC
HCT: 47.5 % — ABNORMAL HIGH (ref 36.0–46.0)
HEMOGLOBIN: 15.1 g/dL — AB (ref 12.0–15.0)
MCH: 29 pg (ref 26.0–34.0)
MCHC: 31.8 g/dL (ref 30.0–36.0)
MCV: 91.3 fL (ref 80.0–100.0)
NRBC: 0 % (ref 0.0–0.2)
PLATELETS: 315 10*3/uL (ref 150–400)
RBC: 5.2 MIL/uL — AB (ref 3.87–5.11)
RDW: 12.9 % (ref 11.5–15.5)
WBC: 15.1 10*3/uL — ABNORMAL HIGH (ref 4.0–10.5)

## 2018-08-15 LAB — LIPASE, BLOOD: LIPASE: 26 U/L (ref 11–51)

## 2018-08-15 NOTE — ED Triage Notes (Addendum)
Pt reports emesis more than 10 times today, some diarrhea with abd pain.  Her boyfriend was having n/v x 2 days ago.  She is unsure if she has had a fever but her back have been sweating a lot.  She is A&O x 4, in NAD.

## 2018-08-16 ENCOUNTER — Telehealth (HOSPITAL_COMMUNITY): Payer: Self-pay | Admitting: Emergency Medicine

## 2018-08-16 MED ORDER — SODIUM CHLORIDE 0.9 % IV BOLUS
1000.0000 mL | Freq: Once | INTRAVENOUS | Status: AC
  Administered 2018-08-16: 1000 mL via INTRAVENOUS

## 2018-08-16 MED ORDER — ALUM & MAG HYDROXIDE-SIMETH 200-200-20 MG/5ML PO SUSP
30.0000 mL | Freq: Once | ORAL | Status: AC
  Administered 2018-08-16: 30 mL via ORAL
  Filled 2018-08-16: qty 30

## 2018-08-16 MED ORDER — ONDANSETRON HCL 4 MG/2ML IJ SOLN
4.0000 mg | Freq: Once | INTRAMUSCULAR | Status: AC
  Administered 2018-08-16: 4 mg via INTRAVENOUS
  Filled 2018-08-16: qty 2

## 2018-08-16 MED ORDER — PROMETHAZINE HCL 25 MG RE SUPP: 25.0000 mg | Freq: Four times a day (QID) | RECTAL | 0 refills | Status: DC | PRN

## 2018-08-16 MED ORDER — ONDANSETRON 4 MG PO TBDP: 4.0000 mg | ORAL_TABLET | Freq: Three times a day (TID) | ORAL | 0 refills | Status: DC | PRN

## 2018-08-16 MED ORDER — KETOROLAC TROMETHAMINE 15 MG/ML IJ SOLN
15.0000 mg | Freq: Once | INTRAMUSCULAR | Status: AC
  Administered 2018-08-16: 15 mg via INTRAVENOUS
  Filled 2018-08-16: qty 1

## 2018-08-16 NOTE — Telephone Encounter (Signed)
Entered in Error - meds sent to pharmacy via Epic encounter, no telephone encounter needed/completed.

## 2018-08-16 NOTE — ED Provider Notes (Signed)
[facility]-EMERGENCY DEPT Provider Note   CSN: [id number] Arrival date & time: 08/15/18  2100     History   Chief Complaint Chief Complaint  Patient presents with  . Emesis    HPI Ariel Mooney is a 17 y.o. female.  HPI   17 year old female with past medical history as below here with nausea and vomiting.  The patient states that she has had multiple sick contacts in the home, with similar symptoms.  She states that throughout the day today, she developed gradual onset of progressive worsening nausea and vomiting.  Denies any associated abdominal pain, other than mild hunger pains that she feels like she would like to eat, but is unable to keep anything down.  Over the last hour or 2, she is noticed mild dizziness upon standing.  No dizziness at rest.  Denies any overt abdominal pain.  No fevers.  No chills.  He she has been urinating without difficulty and denies any dysuria.  No vaginal bleeding or discharge.  She has not tried anything for her symptoms.  Past Medical History:  Diagnosis Date  . Abdominal pain, recurrent   . Anxiety   . Asthma   . Blood transfusion without reported diagnosis     Patient Active Problem List   Diagnosis Date Noted  . Other allergic rhinitis 05/22/2016  . Acute sinusitis 05/22/2016  . Moderate persistent asthma with exacerbation 01/07/2016  . Moderate persistent asthma without complication 01/07/2016  . Constipation due to slow transit 11/02/2014  . Dyspnea   . Asthma exacerbation 07/06/2014  . 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 Headache above the eye region 03/29/2011  . Lower abdominal pain     Past Surgical History:  Procedure Laterality Date  . TONSILLECTOMY    . TONSILLECTOMY  2010     OB History   No obstetric history on file.      Home Medications    Prior to Admission medications   Medication Sig Start Date End Date Taking? Authorizing Provider  albuterol (PROVENTIL  HFA;VENTOLIN HFA) 108 (90 Base) MCG/ACT inhaler INHALE 2 PUFFS INTO THE LUNGS EVERY 4 HOURS AS NEEDED FOR WHEEZING OR SHORTNESS OF BREATH 06/26/18  Yes Ellamae Sia, DO  LO LOESTRIN FE 1 MG-10 MCG / 10 MCG tablet Take 1 tablet by mouth daily.  01/06/18  Yes [provider]  mometasone-formoterol (DULERA) 200-5 MCG/ACT AERO Inhale 2 puffs into the lungs 2 (two) times daily. Inhale into the lungs. 08/08/18  Yes Padgett, Pilar Grammes, MD  montelukast (SINGULAIR) 10 MG tablet Take 1 tablet (10 mg total) by mouth at bedtime. 06/26/18  Yes Ellamae Sia, DO  Tiotropium Bromide Monohydrate (SPIRIVA RESPIMAT) 1.25 MCG/ACT AERS Inhale 2 puffs into the lungs daily. 06/26/18  Yes Ellamae Sia, DO  cyclobenzaprine (FLEXERIL) 10 MG tablet Take 1 tablet (10 mg total) by mouth at bedtime. Patient not taking: Reported on 08/08/2018 07/03/18   Wurst, [place], PA-C  ondansetron (ZOFRAN ODT) 4 MG disintegrating tablet Take 1 tablet (4 mg total) by mouth every 8 (eight) hours as needed for nausea or vomiting. 08/16/18   Shaune Pollack, MD  promethazine (PHENERGAN) 25 MG suppository Place 1 suppository (25 mg total) rectally every 6 (six) hours as needed for nausea or vomiting. 08/16/18   Shaune Pollack, MD    Family History Family History  Problem Relation Age of Onset  . Asthma Sister   . Asthma Brother   . Cholelithiasis Sister   .  Asthma Sister   . Asthma Sister   . Asthma Brother   . Asthma Brother   . Asthma Mother   . Ulcers Maternal Grandmother   . Cholelithiasis Maternal Grandmother   . Asthma Maternal Grandmother   . Allergic rhinitis Neg Hx   . Angioedema Neg Hx   . Eczema Neg Hx   . Immunodeficiency Neg Hx   . Urticaria Neg Hx     Social History Social History   Tobacco Use  . Smoking status: Never Smoker  . Smokeless tobacco: Never Used  Substance Use Topics  . Alcohol use: No  . Drug use: No     Allergies   Lac bovis; Lactose intolerance (gi); Latex; and  Other   Review of Systems Review of Systems  Constitutional: Positive for fatigue. Negative for chills and fever.  HENT: Negative for congestion, rhinorrhea and sore throat.   Eyes: Negative for visual disturbance.  Respiratory: Negative for cough, shortness of breath and wheezing.   Cardiovascular: Negative for chest pain and leg swelling.  Gastrointestinal: Positive for abdominal pain, diarrhea, nausea and vomiting.  Genitourinary: Negative for dysuria, flank pain, vaginal bleeding and vaginal discharge.  Musculoskeletal: Negative for neck pain.  Skin: Negative for rash.  Allergic/Immunologic: Negative for immunocompromised state.  Neurological: Positive for weakness. Negative for syncope and headaches.  Hematological: Does not bruise/bleed easily.  All other systems reviewed and are negative.    Physical Exam Updated Vital Signs BP 132/82 (BP Location: Right Arm)   Pulse 85   Temp 98.9 F (37.2 C) (Oral)   Resp 16   Ht 5\' 1"  (1.549 m)   Wt 95.7 kg   LMP 08/07/2018   SpO2 100%   BMI 39.87 kg/m   Physical Exam Vitals signs and nursing note reviewed.  Constitutional:      General: She is not in acute distress.    Appearance: She is well-developed.  HENT:     Head: Normocephalic and atraumatic.     Mouth/Throat:     Mouth: Mucous membranes are dry.  Eyes:     Conjunctiva/sclera: Conjunctivae normal.  Neck:     Musculoskeletal: Neck supple.  Cardiovascular:     Rate and Rhythm: Normal rate and regular rhythm.     Heart sounds: Normal heart sounds. No murmur. No friction rub.  Pulmonary:     Effort: Pulmonary effort is normal. No respiratory distress.     Breath sounds: Normal breath sounds. No wheezing or rales.  Abdominal:     General: Bowel sounds are increased. There is no distension.     Palpations: Abdomen is soft.     Tenderness: There is no abdominal tenderness.  Skin:    General: Skin is warm.     Capillary Refill: Capillary refill takes less than 2  seconds.  Neurological:     Mental Status: She is alert and oriented to person, place, and time.     Motor: No abnormal muscle tone.      ED Treatments / Results  Labs (all labs ordered are listed, but only abnormal results are displayed) Labs Reviewed  COMPREHENSIVE METABOLIC PANEL - Abnormal; Notable for the following components:      Result Value   Glucose, Bld 106 (*)    Total Protein 8.6 (*)    All other components within normal limits  CBC - Abnormal; Notable for the following components:   WBC 15.1 (*)    RBC 5.20 (*)    Hemoglobin 15.1 (*)  HCT 47.5 (*)    All other components within normal limits  URINALYSIS, ROUTINE W REFLEX MICROSCOPIC - Abnormal; Notable for the following components:   APPearance CLOUDY (*)    Ketones, ur 80 (*)    Leukocytes, UA SMALL (*)    Bacteria, UA RARE (*)    All other components within normal limits  LIPASE, BLOOD  POC URINE PREG, ED    EKG None  Radiology No results found.  Procedures Procedures (including critical care time)  Medications Ordered in ED Medications  sodium chloride 0.9 % bolus 1,000 mL (0 mLs Intravenous Stopped 08/16/18 0227)  sodium chloride 0.9 % bolus 1,000 mL (0 mLs Intravenous Stopped 08/16/18 0333)  ondansetron (ZOFRAN) injection 4 mg (4 mg Intravenous Given 08/16/18 0116)  ketorolac (TORADOL) 15 MG/ML injection 15 mg (15 mg Intravenous Given 08/16/18 0116)  alum & mag hydroxide-simeth (MAALOX/MYLANTA) 200-200-20 MG/5ML suspension 30 mL (30 mLs Oral Given 08/16/18 0250)  ondansetron (ZOFRAN) injection 4 mg (4 mg Intravenous Given 08/16/18 0250)     Initial Impression / Assessment and Plan / ED Course  I have reviewed the triage vital signs and the nursing notes.  Pertinent labs & imaging results that were available during my care of the patient were reviewed by me and considered in my medical decision making (see chart for details).     17 yo F here w/ nausea, vomiting. Pt has multiple known  sick contacts at home. Abdomen is soft, NT, ND. No urinary or vaginal sx. She appears dehydrated and mildly hemoconcentrated on labs but otherwise is very well appearing. She was given IVF with marked improvement and is now tolerating PO. Abdomen is benign with on TTP to suggest cholecystitis, appendicitis, obstruction, or other intra-abd emergency. Will d/c with supportive care.  Final Clinical Impressions(s) / ED Diagnoses   Final diagnoses:  Non-intractable vomiting with nausea, unspecified vomiting type      Shaune PollackIsaacs, Mieka Leaton, MD 08/16/18 [phone]

## 2018-09-30 ENCOUNTER — Encounter (INDEPENDENT_AMBULATORY_CARE_PROVIDER_SITE_OTHER): Payer: Self-pay | Admitting: Orthopaedic Surgery

## 2018-09-30 ENCOUNTER — Ambulatory Visit (INDEPENDENT_AMBULATORY_CARE_PROVIDER_SITE_OTHER): Payer: Medicaid Other | Admitting: Orthopaedic Surgery

## 2018-09-30 ENCOUNTER — Ambulatory Visit (INDEPENDENT_AMBULATORY_CARE_PROVIDER_SITE_OTHER): Payer: Medicaid Other

## 2018-09-30 DIAGNOSIS — S161XXA Strain of muscle, fascia and tendon at neck level, initial encounter: Secondary | ICD-10-CM | POA: Diagnosis not present

## 2018-09-30 DIAGNOSIS — M542 Cervicalgia: Secondary | ICD-10-CM | POA: Insufficient documentation

## 2018-09-30 MED ORDER — METHYLPREDNISOLONE 4 MG PO TBPK: ORAL_TABLET | ORAL | 0 refills | Status: DC

## 2018-09-30 MED ORDER — METHOCARBAMOL 500 MG PO TABS: 500.0000 mg | ORAL_TABLET | Freq: Every evening | ORAL | 0 refills | Status: DC | PRN

## 2018-09-30 NOTE — Progress Notes (Signed)
Office Visit Note   Patient: Ariel Mooney           Date of Birth: Dec 11, 2000           MRN: [id number] Visit Date: 09/30/2018              Requested by: No referring provider defined for this encounter. PCP: Patient, No Pcp Per   Assessment & Plan: Visit Diagnoses:  1. Strain of neck muscle, initial encounter     Plan: Impression is cervical strain.  We will send in a steroid taper and a small dose of a muscle relaxer to take as needed.  I will also start her in formal physical therapy.  She will follow-up with [place] as needed.  Follow-Up Instructions: Return if symptoms worsen or fail to improve.   Orders:  Orders Placed This Encounter  Procedures  . XR Cervical Spine 2 or 3 views   Meds ordered this encounter  Medications  . methylPREDNISolone (MEDROL DOSEPAK) 4 MG TBPK tablet    Sig: Take as directed    Dispense:  21 tablet    Refill:  0  . methocarbamol (ROBAXIN) 500 MG tablet    Sig: Take 1 tablet (500 mg total) by mouth at bedtime as needed for muscle spasms.    Dispense:  5 tablet    Refill:  0      Procedures: No procedures performed   Clinical Data: No additional findings.   Subjective: Chief Complaint  Patient presents with  . Neck - Pain    HPI patient is a pleasant 18 year old girl who comes in today with neck pain.  This began approximately 1 year ago when she fell hitting the back of her head on the running board of a truck.  She was seen in urgent care setting where she was given NSAIDs.  She had minimal relief of symptoms.  The pain she has has worsened over the past 4 months.  No new injury or change in activity.  The pain she has is to the middle and right side of her neck.  She describes this as a constant sharp pain with occasional swelling.  Nothing seems to worsen this.  She does get mild relief with massage therapy.  She has not taken any recent medications.  She denies any focal weakness.  No numbness, tingling or burning.  Review  of Systems as detailed in HPI.  All others reviewed and are negative.   Objective: Vital Signs: There were no vitals taken for this visit.  Physical Exam well-developed and well-nourished female in no acute distress.  Alert and oriented x3.  Ortho Exam examination of her cervical spine reveals no spinous tenderness.  She has some right sided para spinous musculature tenderness.  Full range of motion, although she has some pain with flexion.  Specialty Comments:  No specialty comments available.  Imaging: Xr Cervical Spine 2 Or 3 Views  Result Date: 09/30/2018 No acute or structural abnormalities    PMFS History: Patient Active Problem List   Diagnosis Date Noted  . Neck pain 09/30/2018  . Other allergic rhinitis 05/22/2016  . Acute sinusitis 05/22/2016  . Moderate persistent asthma with exacerbation 01/07/2016  . Moderate persistent asthma without complication 01/07/2016  . Constipation due to slow transit 11/02/2014  . Dyspnea   . Asthma exacerbation 07/06/2014  . 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 Headache above the eye region 03/29/2011  . Lower abdominal pain    Past  Medical History:  Diagnosis Date  . Abdominal pain, recurrent   . Anxiety   . Asthma   . Blood transfusion without reported diagnosis     Family History  Problem Relation Age of Onset  . Asthma Sister   . Asthma Brother   . Cholelithiasis Sister   . Asthma Sister   . Asthma Sister   . Asthma Brother   . Asthma Brother   . Asthma Mother   . Ulcers Maternal Grandmother   . Cholelithiasis Maternal Grandmother   . Asthma Maternal Grandmother   . Allergic rhinitis Neg Hx   . Angioedema Neg Hx   . Eczema Neg Hx   . Immunodeficiency Neg Hx   . Urticaria Neg Hx     Past Surgical History:  Procedure Laterality Date  . TONSILLECTOMY    . TONSILLECTOMY  2010   Social History   Occupational History  . Not on file  Tobacco Use  . Smoking status: Never Smoker  . Smokeless  tobacco: Never Used  Substance and Sexual Activity  . Alcohol use: No  . Drug use: No  . Sexual activity: Never

## 2018-10-10 ENCOUNTER — Encounter: Payer: Self-pay | Admitting: Allergy

## 2018-10-10 ENCOUNTER — Ambulatory Visit (INDEPENDENT_AMBULATORY_CARE_PROVIDER_SITE_OTHER): Payer: Medicaid Other | Admitting: Allergy

## 2018-10-10 VITALS — BP 110/72 | HR 91 | Resp 16

## 2018-10-10 DIAGNOSIS — J3089 Other allergic rhinitis: Secondary | ICD-10-CM

## 2018-10-10 DIAGNOSIS — J4540 Moderate persistent asthma, uncomplicated: Secondary | ICD-10-CM | POA: Diagnosis not present

## 2018-10-10 MED ORDER — AZELASTINE HCL 0.1 % NA SOLN: 2.0000 | Freq: Two times a day (BID) | NASAL | 5 refills | Status: DC

## 2018-10-10 MED ORDER — ALBUTEROL SULFATE HFA 108 (90 BASE) MCG/ACT IN AERS: INHALATION_SPRAY | RESPIRATORY_TRACT | 1 refills | Status: DC

## 2018-10-10 NOTE — Progress Notes (Signed)
Follow-up Note  RE: Ariel Mooney MRN: [id number] DOB: 2001/03/18 Date of Office Visit: 10/10/2018   History of present illness: Ariel Mooney is a 18 y.o. female presenting today for follow-up of asthma and allergic rhinitis.  She was last seen in the office on 08/08/18 by myself.  I recommended that she use Dulera 2 puffs twice a day, spiriva 2 puffs daily and singulair daily.  She states since she has been doing dulera and spiriva she has seen reduction in her asthma symptoms.  She states she tried to get sinulair from the pharmacy but states they would not dispense thus she has not been taking this.  She states she has less daytime wheezing but is still having nighttime awakenings 2-3 nights/week with albuterol use.   She also states that her allergies have been worse with nasal drainage.  She states the nasacort does not help her nasal drainage/congestion.    Review of systems: Review of Systems  Constitutional: Negative for chills, fever and malaise/fatigue.  HENT: Positive for congestion. Negative for ear discharge, ear pain, nosebleeds and sore throat.   Eyes: Negative for pain, discharge and redness.  Respiratory: Negative for cough, shortness of breath and wheezing.   Cardiovascular: Negative for chest pain.  Gastrointestinal: Negative for abdominal pain, constipation, diarrhea, heartburn, nausea and vomiting.  Musculoskeletal: Negative for joint pain.  Skin: Negative for itching and rash.  Neurological: Negative for headaches.    All other systems negative unless noted above in HPI  Past medical/social/surgical/family history have been reviewed and are unchanged unless specifically indicated below.  No changes  Medication List: Allergies as of 10/10/2018      Reactions   Lac Bovis Hives   Lactose Intolerance (gi) Hives, Shortness Of Breath   Latex Shortness Of Breath, Rash   Other Hives      Medication List       Accurate as of October 10, 2018   5:28 PM. Always use your most recent med list.        albuterol 108 (90 Base) MCG/ACT inhaler Commonly known as:  PROVENTIL HFA;VENTOLIN HFA INHALE 2 PUFFS INTO THE LUNGS EVERY 4 HOURS AS NEEDED FOR WHEEZING OR SHORTNESS OF BREATH   azelastine 0.1 % nasal spray Commonly known as:  ASTELIN Place 2 sprays into both nostrils 2 (two) times daily.   cyclobenzaprine 10 MG tablet Commonly known as:  FLEXERIL Take 1 tablet (10 mg total) by mouth at bedtime.   LO LOESTRIN FE 1 MG-10 MCG / 10 MCG tablet Generic drug:  Norethindrone-Ethinyl Estradiol-Fe Biphas Take 1 tablet by mouth daily.   methocarbamol 500 MG tablet Commonly known as:  ROBAXIN Take 1 tablet (500 mg total) by mouth at bedtime as needed for muscle spasms.   mometasone-formoterol 200-5 MCG/ACT Aero Commonly known as:  DULERA Inhale 2 puffs into the lungs 2 (two) times daily. Inhale into the lungs.   montelukast 10 MG tablet Commonly known as:  SINGULAIR Take 1 tablet (10 mg total) by mouth at bedtime.   Tiotropium Bromide Monohydrate 1.25 MCG/ACT Aers Commonly known as:  SPIRIVA RESPIMAT Inhale 2 puffs into the lungs daily.       Known medication allergies: Allergies  Allergen Reactions  . Lac Bovis Hives  . Lactose Intolerance (Gi) Hives and Shortness Of Breath  . Latex Shortness Of Breath and Rash  . Other Hives     Physical examination: Blood pressure 110/72, pulse 91, resp. rate 16, SpO2 97 %.  General:  Alert, interactive, in no acute distress. HEENT: PERRLA, TMs pearly gray, turbinates moderately edematous with clear discharge, post-pharynx non erythematous. Neck: Supple without lymphadenopathy. Lungs: Clear to auscultation without wheezing, rhonchi or rales. {no increased work of breathing. CV: Normal S1, S2 without murmurs. Abdomen: Nondistended, nontender. Skin: Warm and dry, without lesions or rashes. Extremities:  No clubbing, cyanosis or edema. Neuro:   Grossly  intact.  Diagnositics/Labs:  Spirometry: FEV1: 2.6L 84%, FVC: 3.37L 96%, ratio consistent with nonobstructive pattern  Assessment and plan: Moderate persistent asthma - not well controlled Allergic rhinitis   Continue Dulera 200 2 puffs twice a day with spacer and rinse mouth afterwards   Continue Spiriva 2 puffs daily.  Resume Singulair 10mg  daily.  May use albuterol rescue inhaler 2 puffs or nebulizer every 4 to 6 hours as needed for shortness of breath, chest tightness, coughing, and wheezing. May use albuterol rescue inhaler 2 puffs 5 to 15 minutes prior to strenuous physical activities.  May use over the counter antihistamines such as Zyrtec (cetirizine), Claritin (loratadine), Allegra (fexofenadine), or Xyzal (levocetirizine) daily as needed.  Discussed starting biologic asthma medication for improved control if above regimen is not effective enough.  Will obtain updated CBC w diff for degree of eosinophilia and environmental panel with IgE.   Last testing done in 2018 did show eos abs count of 400 and she has perennial allergen sensitivity with elevated IgE.    Follow up in 3 months or sooner   I appreciate the opportunity to take part in Sanjuana's care. Please do not hesitate to contact me with questions.  Sincerely,   Margo Aye, MD Allergy/Immunology [facility]

## 2018-10-10 NOTE — Patient Instructions (Addendum)
   Continue Dulera 200 2 puffs twice a day with spacer and rinse mouth afterwards   Continue Spiriva 2 puffs daily.  Resume Singulair 10mg  daily.  May use albuterol rescue inhaler 2 puffs or nebulizer every 4 to 6 hours as needed for shortness of breath, chest tightness, coughing, and wheezing. May use albuterol rescue inhaler 2 puffs 5 to 15 minutes prior to strenuous physical activities.  May use over the counter antihistamines such as Zyrtec (cetirizine), Claritin (loratadine), Allegra (fexofenadine), or Xyzal (levocetirizine) daily as needed.  Discussed starting biologic asthma medication for improved control if above regimen is not effective enough.  Will obtain updated CBC w diff for degree of eosinophilia and environmental panel with IgE.   Last testing done in 2018 did show eos abs count of 400 and she has perennial allergen sensitivity with elevated IgE.    Follow up in 3 months or sooner

## 2018-10-25 LAB — CBC WITH DIFFERENTIAL
Basophils Absolute: 0.1 10*3/uL (ref 0.0–0.2)
Basos: 1 %
EOS (ABSOLUTE): 0.4 10*3/uL (ref 0.0–0.4)
Eos: 3 %
Hematocrit: 41.8 % (ref 34.0–46.6)
Hemoglobin: 13.9 g/dL (ref 11.1–15.9)
IMMATURE GRANULOCYTES: 0 %
Immature Grans (Abs): 0 10*3/uL (ref 0.0–0.1)
LYMPHS ABS: 2.3 10*3/uL (ref 0.7–3.1)
Lymphs: 19 %
MCH: 29 pg (ref 26.6–33.0)
MCHC: 33.3 g/dL (ref 31.5–35.7)
MCV: 87 fL (ref 79–97)
Monocytes Absolute: 0.5 10*3/uL (ref 0.1–0.9)
Monocytes: 4 %
Neutrophils Absolute: 9 10*3/uL — ABNORMAL HIGH (ref 1.4–7.0)
Neutrophils: 73 %
RBC: 4.79 x10E6/uL (ref 3.77–5.28)
RDW: 12.8 % (ref 11.7–15.4)
WBC: 12.3 10*3/uL — ABNORMAL HIGH (ref 3.4–10.8)

## 2018-10-25 LAB — ALLERGENS W/TOTAL IGE AREA 2
Alternaria Alternata IgE: <0.1 kU/L
Aspergillus Fumigatus IgE: 0.36 kU/L — AB
Bermuda Grass IgE: 2.84 kU/L — AB
COTTONWOOD IGE: 0.28 kU/L — AB
Cat Dander IgE: 48.1 kU/L — AB
Cladosporium Herbarum IgE: <0.1 kU/L
Cockroach, German IgE: <0.1 kU/L
Common Silver Birch IgE: 0.23 kU/L — AB
D Farinae IgE: 41.5 kU/L — AB
D001-IGE D PTERONYSSINUS: 65.1 kU/L — AB
Dog Dander IgE: 45.3 kU/L — AB
Elm, American IgE: 0.34 kU/L — AB
IgE (Immunoglobulin E), Serum: 732 IU/mL — ABNORMAL HIGH (ref 6–495)
Johnson Grass IgE: 8.37 kU/L — AB
Maple/Box Elder IgE: 0.28 kU/L — AB
Mouse Urine IgE: 0.71 kU/L — AB
Oak, White IgE: 0.3 kU/L — AB
Pecan, Hickory IgE: 1.53 kU/L — AB
Penicillium Chrysogen IgE: 0.25 kU/L — AB
Ragweed, Short IgE: 0.3 kU/L — AB
Sheep Sorrel IgE Qn: 0.35 kU/L — AB
T006-IGE CEDAR, MOUNTAIN: 0.23 kU/L — AB
Timothy Grass IgE: 39.1 kU/L — AB
W014-IGE PIGWEED, ROUGH: 0.25 kU/L — AB
White Mulberry IgE: 0.18 kU/L — AB

## 2018-10-28 ENCOUNTER — Telehealth: Payer: Self-pay

## 2018-10-28 MED ORDER — MONTELUKAST SODIUM 10 MG PO TABS: 10.0000 mg | ORAL_TABLET | Freq: Every day | ORAL | 5 refills | Status: DC

## 2018-10-28 NOTE — Telephone Encounter (Signed)
Pt called back for lab results and I sent in Montelukast

## 2018-11-04 ENCOUNTER — Telehealth: Payer: Self-pay | Admitting: *Deleted

## 2018-11-04 NOTE — Telephone Encounter (Signed)
Called and L/M for patient to contact me

## 2018-11-04 NOTE — Telephone Encounter (Signed)
Patient called and would like to start Fasenra per Dr. [name] recommendation. Please submit

## 2018-11-04 NOTE — Telephone Encounter (Signed)
Spoke to patient and advised her that I have approval with Ins will be submitting to [facility] and they will be reaching out to her within a week to get ok to deliver then I will contact her to start therapy

## 2018-11-10 ENCOUNTER — Telehealth: Payer: Self-pay | Admitting: Allergy and Immunology

## 2018-11-10 NOTE — Telephone Encounter (Signed)
Discussed information with pt, verbalized understanding. Letter generated and released to mychart.

## 2018-11-10 NOTE — Telephone Encounter (Signed)
Left vm requesting call back. 

## 2018-11-10 NOTE — Telephone Encounter (Signed)
Call after 11:30

## 2018-11-10 NOTE — Telephone Encounter (Signed)
Spoke with pt. 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 She is concerned with the possibility of her making contact with covid being an asthmatic patient. She would like to know your recommendations regarding her working during this time. Please advise.

## 2018-11-10 NOTE — Telephone Encounter (Signed)
You can provide her with this letter below and she can discuss with her employer her options.     She is at risk if contracted coronavirus to having potentially a more severe illness/recovery.  She should perform proper hand hygiene and other measures as recommended by the Health Department and CDC to prevent getting sick.  She should also have access to proper PPE if there is concern for positive pt or pt deemed to be positive.   She should continue all of her routine asthma medications as outlined from her last visit.    --------------------------------  To Whom It May Concern:  This letter is regarding my patient - Ariel Mooney. She has asthma, and with the current COVID-19 pandemic, having asthma puts her at higher risk of having complications such as asthma flares (which may be potentially life-threatening) if she is infected. Besides the precautions that are already recommended by the [facility] of [facility] and the Centers for Disease Control and Prevention, I also recommend that she be allowed to work from home, which would decrease the risk of infection. Currently, we do not know the length of these restrictions, but when we are notified by national, state, and local health departments that it is safe to resume normal work activity, she may resume normal work activity as well.   Thank you for your consideration and attention to this matter, and thank you also for helping [place] to stay safe by following the recommendations.   Sincerely,  Margo Aye, MD

## 2018-11-10 NOTE — Telephone Encounter (Signed)
Patient has some concerns about her asthma during this virus. She would like to discuss.

## 2018-11-18 ENCOUNTER — Telehealth: Payer: Self-pay | Admitting: *Deleted

## 2018-11-18 DIAGNOSIS — J4540 Moderate persistent asthma, uncomplicated: Secondary | ICD-10-CM

## 2018-11-18 MED ORDER — PREDNISONE 10 MG PO TABS: 30.0000 mg | ORAL_TABLET | Freq: Every day | ORAL | 0 refills | Status: AC

## 2018-11-18 MED ORDER — ALBUTEROL SULFATE HFA 108 (90 BASE) MCG/ACT IN AERS: INHALATION_SPRAY | RESPIRATORY_TRACT | 2 refills | Status: DC

## 2018-11-18 MED ORDER — TIOTROPIUM BROMIDE MONOHYDRATE 1.25 MCG/ACT IN AERS: 2.0000 | INHALATION_SPRAY | Freq: Every day | RESPIRATORY_TRACT | 5 refills | Status: DC

## 2018-11-18 NOTE — Telephone Encounter (Signed)
Patient having lots of issues with her asthma wheezing, shortness of breath.  She advises she is using her Elwin Sleight but has now went through her Proair inhaler since 3/18 and cannot get another fill until 4/18. She is getting ready to start Xolair once her Rx is delivered from pharmacy.

## 2018-11-18 NOTE — Addendum Note (Signed)
Addended by: Devoria Glassing on: 11/18/2018 03:39 PM   Modules accepted: Orders

## 2018-11-18 NOTE — Telephone Encounter (Signed)
Patient advised of instructions will try to resend albuterol #2 and send rx for prednisone to [facility]. They are still processing her Harrington Challenger I will keep check on same

## 2018-11-18 NOTE — Telephone Encounter (Signed)
Is she also using the Spiriva as well with her Elwin Sleight?   It sounds like she needs a prednisone burst to treat her airway inflammation at that time.  Please send in Prednisone 30 mg daily x  7 days.   She should get started on Xolair as soon as it is delivered.  Do we know a delivery date so she can get on schedule for new start?Marland Kitchen

## 2018-11-27 ENCOUNTER — Ambulatory Visit (INDEPENDENT_AMBULATORY_CARE_PROVIDER_SITE_OTHER): Payer: Medicaid Other | Admitting: *Deleted

## 2018-11-27 DIAGNOSIS — J4550 Severe persistent asthma, uncomplicated: Secondary | ICD-10-CM | POA: Diagnosis not present

## 2018-11-27 MED ORDER — EPINEPHRINE 0.3 MG/0.3ML IJ SOAJ: 0.3000 mg | Freq: Once | INTRAMUSCULAR | 0 refills | Status: AC

## 2018-11-27 MED ORDER — BENRALIZUMAB 30 MG/ML ~~LOC~~ SOSY
30.0000 mg | PREFILLED_SYRINGE | SUBCUTANEOUS | Status: AC
  Administered 2018-11-27 – 2019-01-23 (×3): 30 mg via SUBCUTANEOUS

## 2018-11-27 MED ORDER — ALBUTEROL SULFATE (2.5 MG/3ML) 0.083% IN NEBU: 2.5000 mg | INHALATION_SOLUTION | RESPIRATORY_TRACT | 1 refills | Status: DC | PRN

## 2018-11-27 NOTE — Progress Notes (Signed)
Immunotherapy   Patient Details  Name: Ariel Mooney MRN: [id number] Date of Birth: Dec 28, 2000  11/27/2018  [name] started injections for Fasenra 30 mg every 28 days for 3 doses then continue every 8 weeks.  Epi-Pen:Epi-Pen Available  Consent signed and patient instructions given. No problems after 30 minutes in the office.   [name] 11/27/2018, 4:34 PM

## 2018-12-25 ENCOUNTER — Other Ambulatory Visit: Payer: Self-pay

## 2018-12-25 ENCOUNTER — Ambulatory Visit: Payer: Medicaid Other

## 2018-12-25 ENCOUNTER — Ambulatory Visit: Payer: Medicaid Other | Admitting: Allergy

## 2018-12-26 ENCOUNTER — Other Ambulatory Visit: Payer: Self-pay

## 2018-12-26 ENCOUNTER — Ambulatory Visit (INDEPENDENT_AMBULATORY_CARE_PROVIDER_SITE_OTHER): Payer: Medicaid Other | Admitting: *Deleted

## 2018-12-26 DIAGNOSIS — J4550 Severe persistent asthma, uncomplicated: Secondary | ICD-10-CM

## 2018-12-31 ENCOUNTER — Encounter: Payer: Self-pay | Admitting: Orthopedic Surgery

## 2018-12-31 ENCOUNTER — Ambulatory Visit (INDEPENDENT_AMBULATORY_CARE_PROVIDER_SITE_OTHER): Payer: Medicaid Other | Admitting: Orthopedic Surgery

## 2018-12-31 ENCOUNTER — Ambulatory Visit (INDEPENDENT_AMBULATORY_CARE_PROVIDER_SITE_OTHER): Payer: Medicaid Other

## 2018-12-31 ENCOUNTER — Other Ambulatory Visit: Payer: Self-pay

## 2018-12-31 DIAGNOSIS — M79642 Pain in left hand: Secondary | ICD-10-CM

## 2018-12-31 DIAGNOSIS — M25511 Pain in right shoulder: Secondary | ICD-10-CM | POA: Diagnosis not present
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
## 2019-01-01 ENCOUNTER — Telehealth: Payer: Self-pay | Admitting: Orthopedic Surgery

## 2019-01-01 DIAGNOSIS — M542 Cervicalgia: Secondary | ICD-10-CM

## 2019-01-01 NOTE — Telephone Encounter (Signed)
Need neck xrays and mdp

## 2019-01-01 NOTE — Telephone Encounter (Signed)
Please advise. Thanks.  

## 2019-01-01 NOTE — Telephone Encounter (Signed)
Pt states she has numbness and tingling radiating from her shoulder into her hand and elbow that just started this AM.

## 2019-01-02 NOTE — Telephone Encounter (Signed)
Had xrays done in February seen by Dr Roda Shutters for neck pain. She thinks that she has had a reaction to prednisone in the past.  Please advise.

## 2019-01-02 NOTE — Telephone Encounter (Signed)
Pls get mri neck eval right radic

## 2019-01-02 NOTE — Telephone Encounter (Signed)
Scan ordered

## 2019-01-10 ENCOUNTER — Ambulatory Visit
Admission: RE | Admit: 2019-01-10 | Discharge: 2019-01-10 | Disposition: A | Discharge status: Home / Self Care | Payer: Medicaid Other | Source: Ambulatory Visit | Attending: Orthopedic Surgery | Admitting: Orthopedic Surgery

## 2019-01-10 ENCOUNTER — Other Ambulatory Visit: Payer: Self-pay

## 2019-01-10 DIAGNOSIS — M542 Cervicalgia: Secondary | ICD-10-CM

## 2019-01-15 ENCOUNTER — Telehealth: Payer: Self-pay | Admitting: *Deleted

## 2019-01-15 ENCOUNTER — Other Ambulatory Visit: Payer: Self-pay

## 2019-01-15 ENCOUNTER — Ambulatory Visit (INDEPENDENT_AMBULATORY_CARE_PROVIDER_SITE_OTHER): Payer: Medicaid Other | Admitting: Allergy

## 2019-01-15 ENCOUNTER — Encounter: Payer: Self-pay | Admitting: Allergy

## 2019-01-15 DIAGNOSIS — J3089 Other allergic rhinitis: Secondary | ICD-10-CM

## 2019-01-15 DIAGNOSIS — J4541 Moderate persistent asthma with (acute) exacerbation: Secondary | ICD-10-CM

## 2019-01-15 MED ORDER — TIOTROPIUM BROMIDE MONOHYDRATE 1.25 MCG/ACT IN AERS: 2.0000 | INHALATION_SPRAY | Freq: Every day | RESPIRATORY_TRACT | 5 refills | Status: DC

## 2019-01-15 MED ORDER — PREDNISONE 20 MG PO TABS: ORAL_TABLET | ORAL | 0 refills | Status: DC

## 2019-01-15 NOTE — Progress Notes (Signed)
RE: Ariel Mooney MRN: [id number] DOB: 2001-03-07 Date of Telemedicine Visit: 01/15/2019  Referring provider: No ref. provider found Primary care provider: [name], NP  Chief Complaint: Asthma   Telemedicine Follow Up Visit via Telephone: I connected with Ariel Mooney for a follow up on 01/15/19 by telephone and verified that I am speaking with the correct person using two identifiers.   I discussed the limitations, risks, security and privacy concerns of performing an evaluation and management service by telephone and the availability of in person appointments. I also discussed with the patient that there may be a patient responsible charge related to this service. The patient expressed understanding and agreed to proceed.  Patient is at home.  Provider is at the office.  Visit start time: 1025 Visit end time: 18 Insurance consent/check in by: [facility] Medical consent and medical assistant/nurse: Reece Levy  History of Present Illness: She is a 18 y.o. female, who is being followed for asthma. Her previous allergy office visit was on 10/10/18 with Dr. [name].   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  She states her co-workers started developing symptoms this weeked and were tested earlier this week with positive results.  2 nights ago she started feeling like she was having an asthma attack with SOB, wheezing, chest tightness and cough.  She feels these symptoms are worsening.  Last night she awoke from sleep feeling flushed and like it was hard to breathe.  She has been using her albuterol nebulizer now at least twice a day.  She was able to be tested today and is awaiting results.  She has not been febrile.  She is home and states the office is being closed for cleaning.   She has been taking her routine asthma medications including Dulera 2 puffs twice a day, singulair daily.   She does not have the spiriva to use at this time.  She did start on Fasenra and has not had 2 injections.  She states following her 2nd injection earlier this month that she noticed improvement in her symptoms and did not need to use her albuterol and states she "almost forgot I had asthma".     Assessment and Plan: [name] is a 18 y.o. female with: Moderate persistent asthma with exacerbation - currently pending COVID testing after having positive exposures to co-workers.   Allergic rhinitis   Continue Dulera 200 2 puffs twice a day with spacer and rinse mouth afterwards   Use Spiriva 2 puffs daily (prescription sent in today)  Continue Singulair 10mg  daily.  While acutely symptomatic use albuterol via nebulizer every 4 hours scheduled while awake during the day for the next 1-2 days then return to as needed use as below  May use albuterol rescue inhaler 2 puffs or nebulizer every 4 to 6 hours as needed for shortness of breath, chest tightness, coughing, and wheezing. May use albuterol rescue inhaler 2 puffs 5 to 15 minutes prior to strenuous physical activities.  May use over the counter antihistamines such as Zyrtec (cetirizine), Claritin (loratadine), Allegra (fexofenadine), or Xyzal (levocetirizine) daily as needed.  Continue Fasenra injections once a monthy x 3 months (after June's administration dosing will spread out to every 8 weeks).   Take Prednisone 20mg  twice a day for the next 5 days  I have advised if symptoms worsen then she should be evaluated at [facility] ED for further  care  Continue to self-quarantine at home while COVID testing pending and follow guidelines as instructed by your employer and also provided with [facility] depart guidelines for home quarantine via MyChart.    Follow up in 3 months or sooner    Diagnostics: None.  Medication List:  Current Outpatient Medications  Medication Sig Dispense Refill  . albuterol (PROVENTIL HFA;VENTOLIN HFA) 108 (90  Base) MCG/ACT inhaler INHALE 2 PUFFS INTO THE LUNGS EVERY 4 HOURS AS NEEDED FOR WHEEZING OR SHORTNESS OF BREATH 18 g 2  . albuterol (PROVENTIL) (2.5 MG/3ML) 0.083% nebulizer solution Take 3 mLs (2.5 mg total) by nebulization every 4 (four) hours as needed for wheezing or shortness of breath. 75 mL 1  . azelastine (ASTELIN) 0.1 % nasal spray Place 2 sprays into both nostrils 2 (two) times daily. 30 mL 5  . LO LOESTRIN FE 1 MG-10 MCG / 10 MCG tablet Take 1 tablet by mouth daily.   0  . mometasone-formoterol (DULERA) 200-5 MCG/ACT AERO Inhale 2 puffs into the lungs 2 (two) times daily. Inhale into the lungs. 13 g 2  . montelukast (SINGULAIR) 10 MG tablet Take 1 tablet (10 mg total) by mouth at bedtime. 34 tablet 5  . Tiotropium Bromide Monohydrate (SPIRIVA RESPIMAT) 1.25 MCG/ACT AERS Inhale 2 puffs into the lungs daily. 1 g 5  . predniSONE (DELTASONE) 20 MG tablet Take 1 tablet twice daily for 5 days. 10 tablet 0   Current Facility-Administered Medications  Medication Dose Route Frequency Provider Last Rate Last Dose  . Benralizumab SOSY 30 mg  30 mg Subcutaneous Q28 days Marcelyn BruinsPadgett, Bracen Schum Patricia, MD   30 mg at 12/26/18 1231   Allergies: Allergies  Allergen Reactions  . Lac Bovis Hives  . Lactose Intolerance (Gi) Hives and Shortness Of Breath  . Latex Shortness Of Breath and Rash   I reviewed her past medical history, social history, family history, and environmental history and no significant changes have been reported from previous visit on 10/10/18.  Review of Systems  Constitutional: Positive for diaphoresis. Negative for fatigue and fever.  HENT: Negative for congestion, postnasal drip, rhinorrhea, sinus pressure, sinus pain, sneezing and sore throat.   Eyes: Negative for discharge, redness and itching.  Respiratory: Positive for cough, chest tightness, shortness of breath and wheezing.   Cardiovascular: Negative.   Gastrointestinal: Negative.   Musculoskeletal: Negative for myalgias.   Skin: Negative for rash.  Neurological: Negative for headaches.   Objective: Physical Exam Not obtained as encounter was done via telephone.   Previous notes and tests were reviewed.  I discussed the assessment and treatment plan with the patient. The patient was provided an opportunity to ask questions and all were answered. The patient agreed with the plan and demonstrated an understanding of the instructions.   The patient was advised to call back or seek an in-person evaluation if the symptoms worsen or if the condition fails to improve as anticipated.  I provided 16 minutes of non-face-to-face time during this encounter.  It was my pleasure to participate in Hopeasmine Gunnells's care today. Please feel free to contact me with any questions or concerns.   Sincerely,  Yukiko Minnich Larose HiresPatricia Marcos Ruelas, MD

## 2019-01-15 NOTE — Telephone Encounter (Signed)
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 She has been tested 2 weeks ago and was negative and was just tested again today and should know the results by tomorrow. Patient has been her Dulera and Spiriva as directed. She has been having to use her Albuterol a lot more at night. She is concerned that she may be on the verge of having an asthma attack and is wanting to try to avoid an ED visit. She has a tele visit schedule at 10:20 with you.

## 2019-01-15 NOTE — Patient Instructions (Addendum)
Continue Dulera 200 2 puffs twice a day with spacer and rinse mouth afterwards   Use Spiriva 2 puffs daily (prescription sent in today)  Continue Singulair  daily.  While acutely symptomatic use albuterol via nebulizer every 4 hours scheduled while awake during the day for the next 1-2 days then return to as needed use as below  May use albuterol rescue inhaler 2 puffs or nebulizer every 4 to 6 hours as needed for shortness of breath, chest tightness, coughing, and wheezing. May use albuterol rescue inhaler 2 puffs 5 to 15 minutes prior to strenuous physical activities.  May use over the counter antihistamines such as Zyrtec (cetirizine), Claritin (loratadine), Allegra (fexofenadine), or Xyzal (levocetirizine) daily as needed.  Continue Fasenra injections once a monthy x 3 months (after June's administration dosing will spread out to every 8 weeks).   Take Prednisone  twice a day for the next 5 days  I have advised if symptoms worsen then she should be evaluated at [facility] ED for further care  Continue to self-quarantine at home while COVID testing pending and follow guidelines as instructed by your employer and below.    Follow up in 3 months or sooner      Person Under Monitoring Name: Ariel Mooney  Location: [address] [place] [address]   Infection Prevention Recommendations for Individuals Confirmed to have, or Being Evaluated for, 2019 Novel Coronavirus (COVID-19) Infection Who Receive Care at Home  Individuals who are confirmed to have, or are being evaluated for, COVID-19 should follow the prevention steps below until a healthcare provider or local or state health department says they can return to normal activities.  Stay home except to get medical care You should restrict activities outside your home, except for getting medical care. Do not go to work, school, or public areas, and do not use public transportation or taxis.  Call ahead  before visiting your doctor Before your medical appointment, call the healthcare provider and tell them that you have, or are being evaluated for, COVID-19 infection. This will help the healthcare provider's office take steps to keep other people from getting infected.  Ask your healthcare provider to call the local or state health department.  Monitor your symptoms Seek prompt medical attention if your illness is worsening (e.g., difficulty breathing). Before going to your medical appointment, call the healthcare provider and tell them that you have, or are being evaluated for, COVID-19 infection. Ask your healthcare provider to call the local or state health department.  Wear a facemask You should wear a facemask that covers your nose and mouth when you are in the same room with other people and when you visit a healthcare provider. People who live with or visit you should also wear a facemask while they are in the same room with you.  Separate yourself from other people in your home As much as possible, you should stay in a different room from other people in your home. Also, you should use a separate bathroom, if available.  Avoid sharing household items You should not share dishes, drinking glasses, cups, eating utensils, towels, bedding, or other items with other people in your home. After using these items, you should wash them thoroughly with soap and water.  Cover your coughs and sneezes Cover your mouth and nose with a tissue when you cough or sneeze, or you can cough or sneeze into your sleeve. Throw used tissues in a lined trash can, and immediately wash your  hands with soap and water for at least 20 seconds or use an alcohol-based hand rub.  Wash your [employer] your hands often and thoroughly with soap and water for at least 20 seconds. You can use an alcohol-based hand sanitizer if soap and water are not available and if your hands are not visibly dirty. Avoid touching your eyes,  nose, and mouth with unwashed hands.   Prevention Steps for Caregivers and Household Members of Individuals Confirmed to have, or Being Evaluated for, COVID-19 Infection Being Cared for in the Home  If you live with, or provide care at home for, a person confirmed to have, or being evaluated for, COVID-19 infection please follow these guidelines to prevent infection:  Follow healthcare provider's instructions Make sure that you understand and can help the patient follow any healthcare provider instructions for all care.  Provide for the patient's basic needs You should help the patient with basic needs in the home and provide support for getting groceries, prescriptions, and other personal needs.  Monitor the patient's symptoms If they are getting sicker, call his or her medical provider and tell them that the patient has, or is being evaluated for, COVID-19 infection. This will help the healthcare provider's office take steps to keep other people from getting infected.  Ask the healthcare provider to call the local or state health department.  Limit the number of people who have contact with the patient  If possible, have only one caregiver for the patient.  Other household members should stay in another home or place of residence. If this is not possible, they should stay  in another room, or be separated from the patient as much as possible. Use a separate bathroom, if available.  Restrict visitors who do not have an essential need to be in the home.  Keep older adults, very young children, and other sick people away from the patient Keep older adults, very young children, and those who have compromised immune systems or chronic health conditions away from the patient. This includes people with chronic heart, lung, or kidney conditions, diabetes, and cancer.  Ensure good ventilation Make sure that shared spaces in the home have good air flow, such as from an air conditioner or an  opened window, weather permitting.  Wash your hands often  Wash your hands often and thoroughly with soap and water for at least 20 seconds. You can use an alcohol based hand sanitizer if soap and water are not available and if your hands are not visibly dirty.  Avoid touching your eyes, nose, and mouth with unwashed hands.  Use disposable paper towels to dry your hands. If not available, use dedicated cloth towels and replace them when they become wet.  Wear a facemask and gloves  Wear a disposable facemask at all times in the room and gloves when you touch or have contact with the patient's blood, body fluids, and/or secretions or excretions, such as sweat, saliva, sputum, nasal mucus, vomit, urine, or feces.  Ensure the mask fits over your nose and mouth tightly, and do not touch it during use.  Throw out disposable facemasks and gloves after using them. Do not reuse.  Wash your hands immediately after removing your facemask and gloves.  If your personal clothing becomes contaminated, carefully remove clothing and launder. Wash your hands after handling contaminated clothing.  Place all used disposable facemasks, gloves, and other waste in a lined container before disposing them with other household waste.  Remove gloves and  wash your hands immediately after handling these items.  Do not share dishes, glasses, or other household items with the patient  Avoid sharing household items. You should not share dishes, drinking glasses, cups, eating utensils, towels, bedding, or other items with a patient who is confirmed to have, or being evaluated for, COVID-19 infection.  After the person uses these items, you should wash them thoroughly with soap and water.  Wash laundry thoroughly  Immediately remove and wash clothes or bedding that have blood, body fluids, and/or secretions or excretions, such as sweat, saliva, sputum, nasal mucus, vomit, urine, or feces, on them.  Wear gloves when  handling laundry from the patient.  Read and follow directions on labels of laundry or clothing items and detergent. In general, wash and dry with the warmest temperatures recommended on the label.  Clean all areas the individual has used often  Clean all touchable surfaces, such as counters, tabletops, doorknobs, bathroom fixtures, toilets, phones, keyboards, tablets, and bedside tables, every day. Also, clean any surfaces that may have blood, body fluids, and/or secretions or excretions on them.  Wear gloves when cleaning surfaces the patient has come in contact with.  Use a diluted bleach solution (e.g., dilute bleach with 1 part bleach and 10 parts water) or a household disinfectant with a label that says EPA-registered for coronaviruses. To make a bleach solution at home, add 1 tablespoon of bleach to 1 quart (4 cups) of water. For a larger supply, add  cup of bleach to 1 gallon (16 cups) of water.  Read labels of cleaning products and follow recommendations provided on product labels. Labels contain instructions for safe and effective use of the cleaning product including precautions you should take when applying the product, such as wearing gloves or eye protection and making sure you have good ventilation during use of the product.  Remove gloves and wash hands immediately after cleaning.  Monitor yourself for signs and symptoms of illness Caregivers and household members are considered close contacts, should monitor their health, and will be asked to limit movement outside of the home to the extent possible. Follow the monitoring steps for close contacts listed on the symptom monitoring form.   ? If you have additional questions, contact your local health department or call the epidemiologist on call at [phone] (available 24/7). ? This guidance is subject to change. For the most up-to-date guidance from [facility], please refer to their  website: [email or web address]

## 2019-01-16 ENCOUNTER — Other Ambulatory Visit: Payer: Self-pay

## 2019-01-16 MED ORDER — TIOTROPIUM BROMIDE MONOHYDRATE 1.25 MCG/ACT IN AERS: 2.0000 | INHALATION_SPRAY | Freq: Every day | RESPIRATORY_TRACT | 5 refills | Status: DC

## 2019-01-19 ENCOUNTER — Telehealth: Payer: Self-pay | Admitting: *Deleted

## 2019-01-19 NOTE — Telephone Encounter (Signed)
PA for Spiriva Respimat 1.25 Approved confirmation # [id number] w

## 2019-01-23 ENCOUNTER — Ambulatory Visit (INDEPENDENT_AMBULATORY_CARE_PROVIDER_SITE_OTHER): Payer: Medicaid Other

## 2019-01-23 ENCOUNTER — Other Ambulatory Visit: Payer: Self-pay

## 2019-01-23 DIAGNOSIS — J4550 Severe persistent asthma, uncomplicated: Secondary | ICD-10-CM

## 2019-01-23 DIAGNOSIS — J4541 Moderate persistent asthma with (acute) exacerbation: Secondary | ICD-10-CM

## 2019-02-09 ENCOUNTER — Encounter: Payer: Self-pay | Admitting: Allergy

## 2019-02-09 ENCOUNTER — Other Ambulatory Visit: Payer: Self-pay

## 2019-02-09 ENCOUNTER — Ambulatory Visit (INDEPENDENT_AMBULATORY_CARE_PROVIDER_SITE_OTHER): Payer: Medicaid Other | Admitting: Allergy

## 2019-02-09 ENCOUNTER — Telehealth: Payer: Self-pay | Admitting: *Deleted

## 2019-02-09 VITALS — BP 94/60 | HR 73 | Temp 98.3°F | Resp 20 | Ht 61.0 in

## 2019-02-09 DIAGNOSIS — J3089 Other allergic rhinitis: Secondary | ICD-10-CM | POA: Diagnosis not present

## 2019-02-09 DIAGNOSIS — H1010 Acute atopic conjunctivitis, unspecified eye: Secondary | ICD-10-CM | POA: Diagnosis not present

## 2019-02-09 DIAGNOSIS — J4551 Severe persistent asthma with (acute) exacerbation: Secondary | ICD-10-CM

## 2019-02-09 DIAGNOSIS — J302 Other seasonal allergic rhinitis: Secondary | ICD-10-CM | POA: Diagnosis not present

## 2019-02-09 MED ORDER — SPIRIVA RESPIMAT 1.25 MCG/ACT IN AERS: 2.0000 | INHALATION_SPRAY | Freq: Every day | RESPIRATORY_TRACT | 5 refills | Status: DC

## 2019-02-09 MED ORDER — METHYLPREDNISOLONE ACETATE 40 MG/ML IJ SUSP
40.0000 mg | Freq: Once | INTRAMUSCULAR | Status: AC
  Administered 2019-02-09: 40 mg via INTRAMUSCULAR

## 2019-02-09 NOTE — Patient Instructions (Addendum)
.   Steroid injection given today.  Start prednisone taper.  Use albuterol nebulizer every 4 hours while awake for the next 5 days.  . Daily controller medication(s): continue Dulera 200 2 puffs twice a day with spacer and rinse mouth afterwards. o Continue Spiriva 1.47mcg 2 puffs daily. o Continue Singulair 10mg  daily.  o Continue Fasenra injections . Prior to physical activity: May use albuterol rescue inhaler 2 puffs 5 to 15 minutes prior to strenuous physical activities. Marland Kitchen Rescue medications: May use albuterol rescue inhaler 2 puffs or nebulizer every 4 to 6 hours as needed for shortness of breath, chest tightness, coughing, and wheezing. Monitor frequency of use.  ? If symptoms not controlled with 2 back to back nebulizers then go to ER. . Asthma control goals:  o Full participation in all desired activities (may need albuterol before activity) o Albuterol use two times or less a week on average (not counting use with activity) o Cough interfering with sleep two times or less a month o Oral steroids no more than once a year o No hospitalizations  Follow up in 4 weeks.

## 2019-02-09 NOTE — Assessment & Plan Note (Addendum)
Asthma flare started 3 days ago and worsening symptoms. Ariel Mooney has been helping but now due for injection in July and does not think the medication is lasting her the full 8 weeks. This is the second flare she had since May.  . IM depo 40mg  given today.  Start prednisone taper.  Use albuterol nebulizer every 4 hours while awake for the next 5 days.  . Daily controller medication(s): continue Dulera 200 2 puffs twice a day with spacer and rinse mouth afterwards. New spacer given today.  o Continue Spiriva 1.66mcg 2 puffs daily. o Continue Singulair 10mg  daily.  o Continue Fasenra injections for now. . Prior to physical activity: May use albuterol rescue inhaler 2 puffs 5 to 15 minutes prior to strenuous physical activities. Marland Kitchen Rescue medications: May use albuterol rescue inhaler 2 puffs or nebulizer every 4 to 6 hours as needed for shortness of breath, chest tightness, coughing, and wheezing. Monitor frequency of use.  ? If symptoms not controlled with 2 back to back nebulizers then go to ER.

## 2019-02-09 NOTE — Progress Notes (Signed)
Follow Up Note  RE: Ariel Mooney MRN: [id number] DOB: 05/31/2001 Date of Office Visit: 02/09/2019  Referring provider: Tomasa Hose, NP Primary care provider: Tomasa Hose, NP  Chief Complaint: Shortness of Breath and Dizziness  History of Present Illness: I had the pleasure of seeing Ariel Mooney for a follow up visit at the [facility] [place] [place] on 02/09/2019. She is a 18 y.o. female, who is being followed for asthma. Today she is here for new complaint of SOB. Her previous allergy office visit was on 01/15/2019 with Dr. Nelva Bush.   Friday night patient started to have wheezing, sob and chest tightness which woke her up at the middle of the night. Symptoms have been worsening since then.  Currently on Spiriva 2 puffs daily, Dulera 200 2 puffs BD, Singulair daily and using albuterol nebulizer with minimal benefit. Still getting Fasenra injections but feels like the week before she is due her asthma flares up. She is due on 7/10 for next injection.   No fevers, chills. 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 She had testing 3 times with negative results to date.   Assessment and Plan: Ariel Mooney is a 18 y.o. female with: Severe persistent asthma with acute exacerbation Asthma flare started 3 days ago and worsening symptoms. Ariel Mooney has been helping but now due for injection in July and does not think the medication is lasting her the full 8 weeks. This is the second flare she had since May.  . IM depo 40mg  given today.  Start prednisone taper.  Use albuterol nebulizer every 4 hours while awake for the next 5 days.  . Daily controller medication(s): continue Dulera 200 2 puffs twice a day with spacer and rinse mouth afterwards. New spacer given today.  o Continue Spiriva 1.84mcg 2 puffs daily. o Continue Singulair 10mg  daily.  o Continue Fasenra injections for now. . Prior to physical activity: May use  albuterol rescue inhaler 2 puffs 5 to 15 minutes prior to strenuous physical activities. Ariel Mooney [place] [employer] medications: May use albuterol rescue inhaler 2 puffs or nebulizer every 4 to 6 hours as needed for shortness of breath, chest tightness, coughing, and wheezing. Monitor frequency of use.  ? If symptoms not controlled with 2 back to back nebulizers then go to ER.  Seasonal and perennial allergic rhinoconjunctivitis Past history - 2015 skin testing positive to grass, weed, tree, mold, dust mite, cat, cockroach, mouse.  Continue environmental control measures.  Continue Singulair 10 mg tablet daily.  May use over the counter antihistamines such as Zyrtec (cetirizine), Claritin (loratadine), Allegra (fexofenadine), or Xyzal (levocetirizine) daily as needed.  Return in about 4 weeks (around 03/09/2019).  Meds ordered this encounter  Medications  . methylPREDNISolone acetate (DEPO-MEDROL) injection 40 mg  . Tiotropium Bromide Monohydrate (SPIRIVA RESPIMAT) 1.25 MCG/ACT AERS    Sig: Inhale 2 puffs into the lungs daily.    Dispense:  1 g    Refill:  5   Diagnostics: None.  Medication List:  Current Outpatient Medications  Medication Sig Dispense Refill  . albuterol (PROVENTIL HFA;VENTOLIN HFA) 108 (90 Base) MCG/ACT inhaler INHALE 2 PUFFS INTO THE LUNGS EVERY 4 HOURS AS NEEDED FOR WHEEZING OR SHORTNESS OF BREATH 18 g 2  . albuterol (PROVENTIL) (2.5 MG/3ML) 0.083% nebulizer solution Take 3 mLs (2.5 mg total) by nebulization every 4 (four) hours as needed for wheezing or shortness of breath. 75 mL 1  . mometasone-formoterol (DULERA) 200-5 MCG/ACT  AERO Inhale 2 puffs into the lungs 2 (two) times daily. Inhale into the lungs. 13 g 2  . Tiotropium Bromide Monohydrate (SPIRIVA RESPIMAT) 1.25 MCG/ACT AERS Inhale 2 puffs into the lungs daily. 1 g 5  . azelastine (ASTELIN) 0.1 % nasal spray Place 2 sprays into both nostrils 2 (two) times daily. 30 mL 5  . LO LOESTRIN FE 1 MG-10 MCG / 10 MCG tablet Take 1  tablet by mouth daily.   0  . montelukast (SINGULAIR) 10 MG tablet Take 1 tablet (10 mg total) by mouth at bedtime. 34 tablet 5   No current facility-administered medications for this visit.    Allergies: Allergies  Allergen Reactions  . Lac Bovis Hives  . Lactose Intolerance (Gi) Hives and Shortness Of Breath  . Latex Shortness Of Breath and Rash   I reviewed her past medical history, social history, family history, and environmental history and no significant changes have been reported from previous visit on 01/15/2019.  Review of Systems  Constitutional: Negative for appetite change, chills, fever and unexpected weight change.  HENT: Negative for congestion and rhinorrhea.   Eyes: Negative for itching.  Respiratory: Positive for cough, chest tightness, shortness of breath and wheezing.   Gastrointestinal: Negative for abdominal pain.  Skin: Negative for rash.  Allergic/Immunologic: Positive for environmental allergies.  Neurological: Negative for headaches.   Objective: BP 94/60   Pulse 73   Temp 98.3 F (36.8 C) (Temporal)   Resp 20   Ht 5\' 1"  (1.549 m)   SpO2 96%   BMI 39.87 kg/m  Body mass index is 39.87 kg/m. Physical Exam  Constitutional: She is oriented to person, place, and time. She appears well-developed and well-nourished.  HENT:  Head: Normocephalic and atraumatic.  Right Ear: External ear normal.  Left Ear: External ear normal.  Nose: Nose normal.  Mouth/Throat: Oropharynx is clear and moist.  Eyes: Conjunctivae and EOM are normal.  Neck: Neck supple.  Cardiovascular: Normal rate, regular rhythm and normal heart sounds. Exam reveals no gallop and no friction rub.  No murmur heard. Pulmonary/Chest: Effort normal. She has no wheezes. She has no rales.  Decreased breath sounds b/l.  Neurological: She is alert and oriented to person, place, and time.  Skin: Skin is warm. No rash noted.  Psychiatric: She has a normal mood and affect. Her behavior is  normal.  Nursing note and vitals reviewed.  Previous notes and tests were reviewed. The plan was reviewed with the patient/family, and all questions/concerned were addressed.  It was my pleasure to see Ariel Mooney today and participate in her care. Please feel free to contact me with any questions or concerns.  Sincerely,  Wyline MoodYoon , DO Allergy & Immunology  [facility] [place] office: [phone] [facility] office: [phone]

## 2019-02-09 NOTE — Assessment & Plan Note (Addendum)
Past history - 2015 skin testing positive to grass, weed, tree, mold, dust mite, cat, cockroach, mouse.  Continue environmental control measures.  Continue Singulair 10 mg tablet daily.  May use over the counter antihistamines such as Zyrtec (cetirizine), Claritin (loratadine), Allegra (fexofenadine), or Xyzal (levocetirizine) daily as needed.

## 2019-02-09 NOTE — Telephone Encounter (Signed)
Patient called states she is having shortness of breathe and trouble breathing this morning would like to be seen in office. Patient made an appt for 1045 with Dr Maudie Mercury states she used her inhaler this morning and has used it at work already. Denies fever, chills, body aches is currently at work and will be coming to the appt

## 2019-02-27 ENCOUNTER — Ambulatory Visit: Payer: Self-pay

## 2019-03-18 ENCOUNTER — Ambulatory Visit: Payer: Medicaid Other | Admitting: Allergy

## 2019-03-20 ENCOUNTER — Ambulatory Visit: Payer: Self-pay

## 2019-03-20 ENCOUNTER — Other Ambulatory Visit: Payer: Self-pay

## 2019-03-20 ENCOUNTER — Ambulatory Visit (INDEPENDENT_AMBULATORY_CARE_PROVIDER_SITE_OTHER): Payer: Medicaid Other

## 2019-03-20 DIAGNOSIS — J4550 Severe persistent asthma, uncomplicated: Secondary | ICD-10-CM | POA: Diagnosis not present

## 2019-03-20 DIAGNOSIS — J4551 Severe persistent asthma with (acute) exacerbation: Secondary | ICD-10-CM

## 2019-03-20 MED ORDER — BENRALIZUMAB 30 MG/ML ~~LOC~~ SOSY
30.0000 mg | PREFILLED_SYRINGE | SUBCUTANEOUS | Status: DC
  Administered 2019-03-20 – 2019-11-04 (×4): 30 mg via SUBCUTANEOUS

## 2019-04-09 ENCOUNTER — Telehealth: Payer: Self-pay | Admitting: Allergy

## 2019-04-09 NOTE — Telephone Encounter (Signed)
Pt called to talk with you about her been pregnc she is six weeks [phone].

## 2019-04-09 NOTE — Telephone Encounter (Signed)
Called and spoke with the patient. The patient just found out today that she is [redacted] weeks pregnant and her OBGYN recommended that she speak with the Asthma doctor in regards to her medications and determine if they are safe for the baby. The patient takes Helyn Numbers, and receives [place] injections. Please advise.

## 2019-04-10 NOTE — Telephone Encounter (Signed)
An appointment has been schedule for Ariel Mooney to come in next week to discuss.

## 2019-04-10 NOTE — Telephone Encounter (Signed)
Please have pt schedule a visit next week so we can discuss her options during pregnancy.

## 2019-04-16 ENCOUNTER — Other Ambulatory Visit: Payer: Self-pay

## 2019-04-16 ENCOUNTER — Encounter: Payer: Self-pay | Admitting: Allergy

## 2019-04-16 ENCOUNTER — Ambulatory Visit: Payer: Self-pay | Admitting: Allergy

## 2019-04-16 ENCOUNTER — Ambulatory Visit (INDEPENDENT_AMBULATORY_CARE_PROVIDER_SITE_OTHER): Payer: Medicaid Other | Admitting: Allergy

## 2019-04-16 VITALS — BP 110/64 | HR 87 | Temp 98.0°F | Resp 16 | Ht 61.0 in | Wt 225.4 lb

## 2019-04-16 DIAGNOSIS — J3089 Other allergic rhinitis: Secondary | ICD-10-CM | POA: Diagnosis not present

## 2019-04-16 DIAGNOSIS — J4550 Severe persistent asthma, uncomplicated: Secondary | ICD-10-CM

## 2019-04-16 MED ORDER — BUDESONIDE-FORMOTEROL FUMARATE 160-4.5 MCG/ACT IN AERO: 2.0000 | INHALATION_SPRAY | Freq: Two times a day (BID) | RESPIRATORY_TRACT | 5 refills | Status: DC

## 2019-04-16 NOTE — Patient Instructions (Signed)
We discussed your pregnancy and asthma and allergy care today.    Will change Dulera to Symbicort 119mcg 2 puffs twice a day as this is a well known inhaler proven to be safe to use during pregnancy  Continue Spiriva 2 puffs daily.  This has been safe to use in pregnancy.    Continue Singulair 10mg  daily.  This has been safe to use in pregnancy.   have access to albuterol inhaler 2 puffs every 4-6 hours as needed for cough/wheeze/shortness of breath/chest tightness.  May use 15-20 minutes prior to activity.   Monitor frequency of use.    Fasenra and biologic asthma medications similar have not been studied in pregnancy and thus we do not have any data regarding pregnancy outcomes.  It it best for you asthma to be under as good control as possible to provide for your baby.  There are mothers-to-be who have continued on Fasenra during pregnancy without any adverse outcomes.  There is a mother-to-baby program that you can look into and gain resources if Berna Bue is continued throughout out pregnancy.  You can call [phone] or [email or web address]  For the program and to enroll if Berna Bue is continued.   We discussed today that even on Fasenra that you have had asthma flares thus it may not be controlling your asthma as best as we wanted and thus it might be best to discontinue during pregnancy.   Your next Berna Bue would be due around 05/15/2019.  Let us know either way if you decide to continue vs stopping  May use over the counter antihistamines such as Zyrtec (cetirizine), Claritin (loratadine) or Xyzal (levocetirizine) daily as needed.   These have all been safe to use in pregnancy.      Follow up in 3 months or sooner

## 2019-04-16 NOTE — Progress Notes (Signed)
Follow-up Note  RE: Ariel Mooney MRN: [id number] DOB: 18-Sep-2000 Date of Office Visit: 04/16/2019   History of present illness: Ariel Mooney is a 18 y.o. female presenting to discuss her asthma medications now that she is newly pregnant now 7 weeks.  She was last seen in the office on 01/15/2019 by Dr. Maudie Mercury at which time she was treated for exacerbation with prednisone and depomedrol injection.  She is on dulera, spiriva, singulair and receiving fasenra injections.  She has had several flares since starting on fasenra.  She also feels like the effects of fasenra to not last her to the 8 week mark for next injection.   She feels like her asthma has been stable since finding out she was pregnant.  For allergy control she states she may take zyrtec.     Review of systems: Review of Systems  Constitutional: Negative for chills, fever and malaise/fatigue.  HENT: Negative for congestion, ear discharge, nosebleeds and sore throat.   Eyes: Negative for pain, discharge and redness.  Respiratory: Negative for cough, shortness of breath and wheezing.   Cardiovascular: Negative for chest pain.  Gastrointestinal: Negative for abdominal pain, constipation, diarrhea, heartburn, nausea and vomiting.  Musculoskeletal: Negative for joint pain.  Skin: Negative for itching and rash.  Neurological: Negative for headaches.    All other systems negative unless noted above in HPI  Past medical/social/surgical/family history have been reviewed and are unchanged unless specifically indicated below.  No changes  Medication List: Allergies as of 04/16/2019      Reactions   Lac Bovis Hives   Lactose Intolerance (gi) Hives, Shortness Of Breath   Latex Shortness Of Breath, Rash      Medication List       Accurate as of April 16, 2019  6:23 PM. If you have any questions, ask your nurse or doctor.        albuterol 108 (90 Base) MCG/ACT inhaler Commonly known as: VENTOLIN HFA INHALE 2  PUFFS INTO THE LUNGS EVERY 4 HOURS AS NEEDED FOR WHEEZING OR SHORTNESS OF BREATH   albuterol (2.5 MG/3ML) 0.083% nebulizer solution Commonly known as: PROVENTIL Take 3 mLs (2.5 mg total) by nebulization every 4 (four) hours as needed for wheezing or shortness of breath.   azelastine 0.1 % nasal spray Commonly known as: ASTELIN Place 2 sprays into both nostrils 2 (two) times daily.   budesonide-formoterol 160-4.5 MCG/ACT inhaler Commonly known as: Symbicort Inhale 2 puffs into the lungs 2 (two) times daily. Started by: Shaylar Charmian Muff, MD   Diclegis 10-10 MG Tbec Generic drug: Doxylamine-Pyridoxine   fluticasone 50 MCG/ACT nasal spray Commonly known as: FLONASE SHAKE LQ AND U 1 SPR IEN D   Lo Loestrin Fe 1 MG-10 MCG / 10 MCG tablet Generic drug: Norethindrone-Ethinyl Estradiol-Fe Biphas Take 1 tablet by mouth daily.   mometasone-formoterol 200-5 MCG/ACT Aero Commonly known as: Dulera Inhale 2 puffs into the lungs 2 (two) times daily. Inhale into the lungs.   montelukast 10 MG tablet Commonly known as: SINGULAIR Take 1 tablet (10 mg total) by mouth at bedtime.   Prenatal 27-1 MG Tabs TK 1 T PO  QD   Spiriva Respimat 1.25 MCG/ACT Aers Generic drug: Tiotropium Bromide Monohydrate Inhale 2 puffs into the lungs daily.       Known medication allergies: Allergies  Allergen Reactions  . Lac Bovis Hives  . Lactose Intolerance (Gi) Hives and Shortness Of Breath  . Latex Shortness Of Breath and Rash  Physical examination: Blood pressure 110/64, pulse 87, temperature 98 F (36.7 C), temperature source Temporal, resp. rate 16, height 5\' 1"  (1.549 m), weight 225 lb 6.4 oz (102.2 kg), last menstrual period 01/23/2019, SpO2 100 %.  General: Alert, interactive, in no acute distress. HEENT: PERRLA, TMs pearly gray, turbinates non-edematous without discharge, post-pharynx non erythematous. Neck: Supple without lymphadenopathy. Lungs: Clear to auscultation without  wheezing, rhonchi or rales. {no increased work of breathing. CV: Normal S1, S2 without murmurs. Abdomen: Nondistended, nontender. Skin: Warm and dry, without lesions or rashes. Extremities:  No clubbing, cyanosis or edema. Neuro:   Grossly intact.  Diagnositics/Labs: Spirometry: FEV1: 3.01L 97%, FVC: 3.46L 99%, ratio consistent with nonobstructive pattern  Assessment and plan: Severe persistent asthma Allergic rhinitis   We discussed your pregnancy and asthma and allergy care today.  Lung function looks great today!  Will change Dulera to Symbicort 160mcg 2 puffs twice a day as this is a well known inhaler proven to be safe to use during pregnancy  Continue Spiriva 2 puffs daily.  This has been safe to use in pregnancy.    Continue Singulair 10mg  daily.  This has been safe to use in pregnancy.   have access to albuterol inhaler 2 puffs every 4-6 hours as needed for cough/wheeze/shortness of breath/chest tightness.  May use 15-20 minutes prior to activity.   Monitor frequency of use.    Fasenra and biologic asthma medications similar have not been studied in pregnancy and thus we do not have any data regarding pregnancy outcomes.  It it best for you asthma to be under as good control as possible to provide for your baby.  There are mothers-to-be who have continued on Fasenra during pregnancy without any adverse outcomes.  There is a mother-to-baby program that you can look into and gain resources if Harrington ChallengerFasenra is continued throughout out pregnancy.  You can call [phone] or [email or web address]  For the program and to enroll if Harrington ChallengerFasenra is continued.   We discussed today that even on Fasenra that you have had asthma flares thus it may not be controlling your asthma as best as we wanted and thus it might be best to discontinue during pregnancy.   Your next Harrington ChallengerFasenra would be due around 05/15/2019.  Let us know either way if you decide to continue vs stopping  May use over the counter  antihistamines such as Zyrtec (cetirizine), Claritin (loratadine) or Xyzal (levocetirizine) daily as needed.   These have all been safe to use in pregnancy.      Follow up in 3 months during pregnancy or sooner  I appreciate the opportunity to take part in Nikeisha's care. Please do not hesitate to contact me with questions.  Sincerely,   Margo AyeShaylar Padgett, MD Allergy/Immunology [facility]

## 2019-05-11 ENCOUNTER — Other Ambulatory Visit: Payer: Self-pay

## 2019-05-11 ENCOUNTER — Encounter (HOSPITAL_COMMUNITY): Payer: Self-pay

## 2019-05-11 ENCOUNTER — Inpatient Hospital Stay (HOSPITAL_COMMUNITY)
Admission: EM | Admit: 2019-05-11 | Discharge: 2019-05-12 | Disposition: A | Discharge status: Home / Self Care | Payer: Medicaid Other | Attending: Obstetrics | Admitting: Obstetrics

## 2019-05-11 DIAGNOSIS — O26891 Other specified pregnancy related conditions, first trimester: Secondary | ICD-10-CM | POA: Insufficient documentation

## 2019-05-11 DIAGNOSIS — G44019 Episodic cluster headache, not intractable: Secondary | ICD-10-CM

## 2019-05-11 DIAGNOSIS — R110 Nausea: Secondary | ICD-10-CM | POA: Insufficient documentation

## 2019-05-11 DIAGNOSIS — R51 Headache: Secondary | ICD-10-CM | POA: Insufficient documentation

## 2019-05-11 DIAGNOSIS — R42 Dizziness and giddiness: Secondary | ICD-10-CM | POA: Insufficient documentation

## 2019-05-11 DIAGNOSIS — Z3A11 11 weeks gestation of pregnancy: Secondary | ICD-10-CM | POA: Insufficient documentation

## 2019-05-11 DIAGNOSIS — J45909 Unspecified asthma, uncomplicated: Secondary | ICD-10-CM | POA: Insufficient documentation

## 2019-05-11 DIAGNOSIS — Z79899 Other long term (current) drug therapy: Secondary | ICD-10-CM | POA: Insufficient documentation

## 2019-05-11 DIAGNOSIS — O99511 Diseases of the respiratory system complicating pregnancy, first trimester: Secondary | ICD-10-CM | POA: Insufficient documentation

## 2019-05-11 NOTE — MAU Note (Signed)
Pt reports to MAU c/o HA's that are severe pt reports feeling weak and like she is going to fall. Pt reports earlier today she had abdominal pain but none currently. Pt is reporting nausea. Pt denies bleeding. Pt reports white discharge.

## 2019-05-11 NOTE — ED Triage Notes (Signed)
Pt c/o headache, nausea and abdominal pain that started today. A&O x 4, reports she's [redacted] weeks pregnant.

## 2019-05-12 DIAGNOSIS — O26891 Other specified pregnancy related conditions, first trimester: Secondary | ICD-10-CM | POA: Diagnosis present

## 2019-05-12 DIAGNOSIS — Z3A11 11 weeks gestation of pregnancy: Secondary | ICD-10-CM

## 2019-05-12 DIAGNOSIS — R51 Headache: Secondary | ICD-10-CM | POA: Diagnosis not present

## 2019-05-12 DIAGNOSIS — J45909 Unspecified asthma, uncomplicated: Secondary | ICD-10-CM | POA: Diagnosis not present

## 2019-05-12 DIAGNOSIS — R42 Dizziness and giddiness: Secondary | ICD-10-CM | POA: Diagnosis not present

## 2019-05-12 DIAGNOSIS — G44019 Episodic cluster headache, not intractable: Secondary | ICD-10-CM

## 2019-05-12 DIAGNOSIS — R110 Nausea: Secondary | ICD-10-CM | POA: Diagnosis not present

## 2019-05-12 DIAGNOSIS — O99511 Diseases of the respiratory system complicating pregnancy, first trimester: Secondary | ICD-10-CM | POA: Diagnosis not present

## 2019-05-12 DIAGNOSIS — Z79899 Other long term (current) drug therapy: Secondary | ICD-10-CM | POA: Diagnosis not present

## 2019-05-12 LAB — URINALYSIS, ROUTINE W REFLEX MICROSCOPIC
Bilirubin Urine: NEGATIVE
Glucose, UA: NEGATIVE mg/dL
Hgb urine dipstick: NEGATIVE
Ketones, ur: NEGATIVE mg/dL
Leukocytes,Ua: NEGATIVE
Nitrite: NEGATIVE
Protein, ur: NEGATIVE mg/dL
Specific Gravity, Urine: 1.013 (ref 1.005–1.030)
pH: 6 (ref 5.0–8.0)

## 2019-05-12 MED ORDER — BUTALBITAL-APAP-CAFFEINE 50-325-40 MG PO TABS: 1.0000 | ORAL_TABLET | Freq: Four times a day (QID) | ORAL | 0 refills | Status: DC | PRN

## 2019-05-12 MED ORDER — METOCLOPRAMIDE HCL 5 MG/ML IJ SOLN
5.0000 mg | Freq: Once | INTRAMUSCULAR | Status: AC
  Administered 2019-05-12: 01:00:00 5 mg via INTRAVENOUS
  Filled 2019-05-12: qty 2

## 2019-05-12 MED ORDER — BUTALBITAL-APAP-CAFFEINE 50-325-40 MG PO TABS
2.0000 | ORAL_TABLET | Freq: Once | ORAL | Status: AC
  Administered 2019-05-12: 02:00:00 2 via ORAL
  Filled 2019-05-12: qty 2

## 2019-05-12 MED ORDER — LACTATED RINGERS IV BOLUS
1000.0000 mL | Freq: Once | INTRAVENOUS | Status: AC
  Administered 2019-05-12: 01:00:00 1000 mL via INTRAVENOUS

## 2019-05-12 MED ORDER — PROCHLORPERAZINE EDISYLATE 10 MG/2ML IJ SOLN
10.0000 mg | Freq: Once | INTRAMUSCULAR | Status: AC
  Administered 2019-05-12: 10 mg via INTRAVENOUS
  Filled 2019-05-12: qty 2

## 2019-05-12 MED ORDER — DIPHENHYDRAMINE HCL 50 MG/ML IJ SOLN
25.0000 mg | Freq: Once | INTRAMUSCULAR | Status: AC
  Administered 2019-05-12: 25 mg via INTRAVENOUS
  Filled 2019-05-12: qty 1

## 2019-05-12 NOTE — MAU Provider Note (Addendum)
History     CSN: [id number]  Arrival date and time: 05/11/19 2254   First Provider Initiated Contact with Patient 05/12/19 0005      Chief Complaint  Patient presents with  . Headache  . Nausea    Ariel Mooney is a 18 y.o. G1P0 at [redacted]w[redacted]d who receives care at [facility].  She presents today for Headache and Nausea.  She states that she felt "severe headache and weakness" that she thinks is related to her "blood pressuring dropping."  Patient states it occurred around 10pm and this is the "second or third time" this has occurred today.  Patient denies a history of migraines and states that she took a tylenol around 9am with relief, but the headache reoccurred at 12pm.  Patient reports that her headache is currently a 10/10 and describes it as a "pounding feeling right behind and all around my eyes."  Patient goes on to report that the left side is worse.  However, patient with lights on in room and does not appear distressed despite stating she has had no previous problems with headaches.  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  Patient reports that she is taking medication at night, but can not recall the name.    24hr Recall Morning: Bread Lunch: Mashed Potatoes, chicken, Brocc Dinner: Journalist, newspaper throughout the day, No other beverages     OB History    Gravida  1   Para      Term      Preterm      AB      Living        SAB      TAB      Ectopic      Multiple      Live Births              Past Medical History:  Diagnosis Date  . Abdominal pain, recurrent   . Asthma   . Blood transfusion without reported diagnosis     Past Surgical History:  Procedure Laterality Date  . TONSILLECTOMY    . TONSILLECTOMY  2010    Family History  Problem  Relation Age of Onset  . Asthma Sister   . Asthma Brother   . Cholelithiasis Sister   . Asthma Sister   . Asthma Sister   . Asthma Brother   . Asthma Brother   . Asthma Mother   . Ulcers Maternal Grandmother   . Cholelithiasis Maternal Grandmother   . Asthma Maternal Grandmother   . Allergic rhinitis Neg Hx   . Angioedema Neg Hx   . Eczema Neg Hx   . Immunodeficiency Neg Hx   . Urticaria Neg Hx     Social History   Tobacco Use  . Smoking status: Never Smoker  . Smokeless tobacco: Never Used  Substance Use Topics  . Alcohol use: No  . Drug use: No    Allergies:  Allergies  Allergen Reactions  . Lac Bovis Hives  . Lactose Intolerance (Gi) Hives and Shortness Of Breath  . Latex Shortness Of Breath and Rash    Facility-Administered Medications Prior to Admission  Medication Dose Route Frequency Provider Last Rate Last Dose  . Benralizumab SOSY 30 mg  30 mg Subcutaneous Q8 Weeks Padgett,  Rae Halsted, MD   30 mg at 03/20/19 1438   Medications Prior to Admission  Medication Sig Dispense Refill Last Dose  . albuterol (PROVENTIL HFA;VENTOLIN HFA) 108 (90 Base) MCG/ACT inhaler INHALE 2 PUFFS INTO THE LUNGS EVERY 4 HOURS AS NEEDED FOR WHEEZING OR SHORTNESS OF BREATH 18 g 2 05/10/2019 at Unknown time  . budesonide-formoterol (SYMBICORT) 160-4.5 MCG/ACT inhaler Inhale 2 puffs into the lungs 2 (two) times daily. 1 Inhaler 5 05/11/2019 at 0800  . DICLEGIS 10-10 MG TBEC    05/10/2019 at Unknown time  . Prenatal 27-1 MG TABS TK 1 T PO  QD   05/10/2019 at Unknown time  . Tiotropium Bromide Monohydrate (SPIRIVA RESPIMAT) 1.25 MCG/ACT AERS Inhale 2 puffs into the lungs daily. 1 g 5 05/11/2019 at 0800  . albuterol (PROVENTIL) (2.5 MG/3ML) 0.083% nebulizer solution Take 3 mLs (2.5 mg total) by nebulization every 4 (four) hours as needed for wheezing or shortness of breath. 75 mL 1 Unknown at Unknown time  . azelastine (ASTELIN) 0.1 % nasal spray Place 2 sprays into both nostrils 2 (two)  times daily. 30 mL 5   . fluticasone (FLONASE) 50 MCG/ACT nasal spray SHAKE LQ AND U 1 SPR IEN D     . LO LOESTRIN FE 1 MG-10 MCG / 10 MCG tablet Take 1 tablet by mouth daily.   0   . mometasone-formoterol (DULERA) 200-5 MCG/ACT AERO Inhale 2 puffs into the lungs 2 (two) times daily. Inhale into the lungs. 13 g 2   . montelukast (SINGULAIR) 10 MG tablet Take 1 tablet (10 mg total) by mouth at bedtime. (Patient not taking: Reported on 04/16/2019) 34 tablet 5     Review of Systems  Constitutional: Negative for chills and fever.  Respiratory: Negative for cough and shortness of breath.   Gastrointestinal: Positive for nausea. Negative for constipation, diarrhea and vomiting.  Genitourinary: Negative for difficulty urinating, dysuria, vaginal bleeding and vaginal discharge.  Neurological: Positive for dizziness, light-headedness and headaches.   Physical Exam   Blood pressure 120/78, pulse 91, temperature 98.3 F (36.8 C), temperature source Oral, resp. rate 17, weight 101.2 kg, last menstrual period 01/23/2019, SpO2 99 %.  Physical Exam  Constitutional: She appears well-developed and well-nourished.  HENT:  Head: Normocephalic and atraumatic.  Eyes: Conjunctivae are normal.  Neck: Normal range of motion.  Cardiovascular: Normal rate, regular rhythm and normal heart sounds.  Respiratory: Effort normal and breath sounds normal.  GI: Soft. Bowel sounds are normal.  Musculoskeletal: Normal range of motion.  Skin: Skin is warm and dry.  Psychiatric: She has a normal mood and affect. Her behavior is normal.    MAU Course  Procedures Results for orders placed or performed during the hospital encounter of 05/11/19 (from the past 24 hour(s))  Urinalysis, Routine w reflex microscopic     Status: None   Collection Time: 05/11/19 11:58 PM  Result Value Ref Range   Color, Urine YELLOW YELLOW   APPearance CLEAR CLEAR   Specific Gravity, Urine 1.013 1.005 - 1.030   pH 6.0 5.0 - 8.0   Glucose,  UA NEGATIVE NEGATIVE mg/dL   Hgb urine dipstick NEGATIVE NEGATIVE   Bilirubin Urine NEGATIVE NEGATIVE   Ketones, ur NEGATIVE NEGATIVE mg/dL   Protein, ur NEGATIVE NEGATIVE mg/dL   Nitrite NEGATIVE NEGATIVE   Leukocytes,Ua NEGATIVE NEGATIVE    MDM Start IV Give HA Cocktail  Assessment and Plan  18 year old G1P0 at 11 weeks Headache Nausea  -Exam findings discussed. -Will  treat with headache cocktail of LR bolus, Compazine, Benadryl, and Reglan for nausea. -Patient without questions or concerns. -Will observe and reassess.  Cherre Robins MSN, CNM Advanced Practice Provider, Center for [facility] 05/12/2019, 12:05 AM   Reassessment (1:27 AM)  -Nurse reports patient states headache now 8/10. -Will give fiorcet and reassess.  Reassessment (1:58 AM)  -Patient reports that she is feeling "a lot better."  -Reports next appt is Thursday; instructed to keep appt. -Discussed usage of medication for HA pain as needed. -Rx for fiorcet sent to pharmacy on file.  -Encouraged to call or return to MAU if symptoms worsen or with the onset of new symptoms. -Discharged to home in improved condition.  Cherre Robins MSN, CNM Advanced Practice Provider, Center for [employer]

## 2019-05-12 NOTE — Discharge Instructions (Signed)
Cluster Headache Cluster headaches are deeply painful. They normally occur on one side of your head, but they may switch sides. Often, cluster headaches:  Are severe.  Happen often for a few weeks or months and then go away for a while.  Last from 15 minutes to 3 hours.  Happen at the same time each day.  Happen at night.  Happen many times a day. Follow these instructions at home:        Follow instructions from your doctor to care for yourself at home:  Go to bed at the same time each night. Get the same amount of sleep every night.  Avoid alcohol.  Stop smoking if you smoke. This includes cigarettes and e-cigarettes.  Take over-the-counter and prescription medicines only as told by your doctor.  Do not drive or use heavy machinery while taking prescription pain medicine.  Use oxygen as told by your doctor.  Exercise regularly.  Eat a healthy diet.  Write down when each headache happened, what kind of pain you had, how bad your pain was, and what you tried to help your pain. This is called a headache diary. Use it as told by your doctor. Contact a doctor if:  Your headaches get worse or they happen more often.  Your medicines are not helping. Get help right away if:  You pass out (faint).  You get weak or lose feeling (have numbness) on one side of your body or face.  You see two of everything (double vision).  You feel sick to your stomach (nauseous) or you throw up (vomit), and you do not stop after many hours.  You have trouble with your balance or with walking.  You have trouble talking.  You have neck pain or stiffness.  You have a fever. This information is not intended to replace advice given to you by your health care provider. Make sure you discuss any questions you have with your health care provider. Document Released: 09/13/2004 Document Revised: 11/01/2017 Document Reviewed: 04/13/2016 Elsevier Patient Education  2020 Elsevier Inc.  

## 2019-05-15 ENCOUNTER — Ambulatory Visit: Payer: Self-pay

## 2019-05-15 LAB — OB RESULTS CONSOLE HIV ANTIBODY (ROUTINE TESTING): HIV: NONREACTIVE

## 2019-05-15 LAB — OB RESULTS CONSOLE HEPATITIS B SURFACE ANTIGEN: Hepatitis B Surface Ag: NEGATIVE

## 2019-05-15 LAB — OB RESULTS CONSOLE GC/CHLAMYDIA
Chlamydia: NEGATIVE
Gonorrhea: NEGATIVE

## 2019-05-15 LAB — OB RESULTS CONSOLE ABO/RH: RH Type: POSITIVE

## 2019-05-15 LAB — OB RESULTS CONSOLE ANTIBODY SCREEN: Antibody Screen: NEGATIVE

## 2019-05-15 LAB — OB RESULTS CONSOLE RPR: RPR: NONREACTIVE

## 2019-05-15 LAB — OB RESULTS CONSOLE RUBELLA ANTIBODY, IGM: Rubella: IMMUNE

## 2019-05-19 ENCOUNTER — Ambulatory Visit: Payer: Self-pay

## 2019-05-29 ENCOUNTER — Ambulatory Visit: Payer: Self-pay

## 2019-06-01 ENCOUNTER — Ambulatory Visit (INDEPENDENT_AMBULATORY_CARE_PROVIDER_SITE_OTHER): Payer: Medicaid Other

## 2019-06-01 ENCOUNTER — Other Ambulatory Visit: Payer: Self-pay

## 2019-06-01 DIAGNOSIS — J4550 Severe persistent asthma, uncomplicated: Secondary | ICD-10-CM | POA: Diagnosis not present

## 2019-06-02 ENCOUNTER — Encounter: Payer: Self-pay | Admitting: Family Medicine

## 2019-06-02 ENCOUNTER — Ambulatory Visit (INDEPENDENT_AMBULATORY_CARE_PROVIDER_SITE_OTHER): Payer: Medicaid Other | Admitting: Family Medicine

## 2019-06-02 VITALS — BP 117/72 | HR 99 | Temp 98.7°F

## 2019-06-02 DIAGNOSIS — L209 Atopic dermatitis, unspecified: Secondary | ICD-10-CM

## 2019-06-02 DIAGNOSIS — H9201 Otalgia, right ear: Secondary | ICD-10-CM | POA: Diagnosis not present

## 2019-06-02 MED ORDER — TRIAMCINOLONE ACETONIDE 0.025 % EX CREA: 1.0000 "application " | TOPICAL_CREAM | Freq: Two times a day (BID) | CUTANEOUS | 3 refills | Status: DC | PRN

## 2019-06-02 NOTE — Progress Notes (Signed)
   Office Visit Note   Patient: Ariel Mooney           Date of Birth: [birth year]           MRN: [id number] Visit Date: 06/02/2019 Requested by: Tomasa Hose, NP [place],  [place] [id number] PCP: Tomasa Hose, NP  Subjective: Chief Complaint  Patient presents with  . right earlobe pain x 2-3 wks after wearing earrings    HPI: She is here with right earlobe troubles.  2 or 3 weeks ago she wore some earrings that contained nickel.  She is very allergic to that.  She has had a moderately itchy rash on the back of her earlobe since then, with cracked skin.              ROS: No fevers or chills.  No pain inside her ear.  All other systems were reviewed and are negative.  Objective: Vital Signs: BP 117/72 (BP Location: Left Arm, Patient Position: Sitting, Cuff Size: Large)   Pulse 99   Temp 98.7 F (37.1 C)   LMP 01/23/2019   Physical Exam:  General:  Alert and oriented, in no acute distress. Pulm:  Breathing unlabored. Psy:  Normal mood, congruent affect. Skin: Right elbow has erythema and cracked dry skin posteriorly.  Internal ear canal looks normal.   Imaging:   Assessment & Plan: 1.  Atopic dermatitis right ear lobe -Topical triamcinolone twice daily until resolved, then stop.      Procedures: No procedures performed  No notes on file     PMFS History: Patient Active Problem List   Diagnosis Date Noted  . Severe persistent asthma with acute exacerbation 02/09/2019  . Seasonal and perennial allergic rhinoconjunctivitis 02/09/2019  . Neck pain 09/30/2018  . Other allergic rhinitis 05/22/2016  . Acute sinusitis 05/22/2016  . Moderate persistent asthma with exacerbation 01/07/2016  . Moderate persistent asthma without complication 75/05/2584  . Constipation due to slow transit 11/02/2014  . Dyspnea   . Asthma exacerbation 07/06/2014  . 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 Headache above the eye region  03/29/2011  . Lower abdominal pain    Past Medical History:  Diagnosis Date  . Abdominal pain, recurrent   . Asthma   . Blood transfusion without reported diagnosis     Family History  Problem Relation Age of Onset  . Asthma Sister   . Asthma Brother   . Cholelithiasis Sister   . Asthma Sister   . Asthma Sister   . Asthma Brother   . Asthma Brother   . Asthma Mother   . Ulcers Maternal Grandmother   . Cholelithiasis Maternal Grandmother   . Asthma Maternal Grandmother   . Allergic rhinitis Neg Hx   . Angioedema Neg Hx   . Eczema Neg Hx   . Immunodeficiency Neg Hx   . Urticaria Neg Hx     Past Surgical History:  Procedure Laterality Date  . TONSILLECTOMY    . TONSILLECTOMY  2010   Social History   Occupational History  . Not on file  Tobacco Use  . Smoking status: Never Smoker  . Smokeless tobacco: Never Used  Substance and Sexual Activity  . Alcohol use: No  . Drug use: No  . Sexual activity: Never

## 2019-07-14 ENCOUNTER — Telehealth: Payer: Self-pay | Admitting: Family Medicine

## 2019-07-14 ENCOUNTER — Encounter: Payer: Self-pay | Admitting: Family Medicine

## 2019-07-14 MED ORDER — MOMETASONE FUROATE 0.1 % EX CREA: 1.0000 "application " | TOPICAL_CREAM | Freq: Every day | CUTANEOUS | 0 refills | Status: DC | PRN

## 2019-07-14 NOTE — Telephone Encounter (Signed)
The medication that prescribed to the patient is no longer working for her ear and is wondering if Dr. Junius Roads would try something else.  Patient uses [facility] on [address].  Thank you.

## 2019-07-14 NOTE — Telephone Encounter (Signed)
Please advise 

## 2019-07-14 NOTE — Telephone Encounter (Signed)
Patient advised.

## 2019-07-14 NOTE — Telephone Encounter (Signed)
Rx sent and info emailed.

## 2019-07-22 ENCOUNTER — Telehealth: Payer: Self-pay

## 2019-07-22 NOTE — Telephone Encounter (Signed)
Ariel Mooney, CMA  [facility]        Can someone call patient and reschedule her Ariel Mooney. Ins will not allow delivery prior to 12/8 so she needs to come in at the earliest the pm of the 8th.  Thanks,  Tammy       lm for pt to call us back about this matter

## 2019-07-23 ENCOUNTER — Ambulatory Visit: Payer: Medicaid Other | Admitting: Allergy

## 2019-07-27 ENCOUNTER — Ambulatory Visit: Payer: Self-pay

## 2019-07-28 ENCOUNTER — Ambulatory Visit (INDEPENDENT_AMBULATORY_CARE_PROVIDER_SITE_OTHER): Payer: Medicaid Other

## 2019-07-28 ENCOUNTER — Other Ambulatory Visit: Payer: Self-pay

## 2019-07-28 DIAGNOSIS — J4550 Severe persistent asthma, uncomplicated: Secondary | ICD-10-CM | POA: Diagnosis not present

## 2019-08-21 NOTE — L&D Delivery Note (Signed)
Patient was C/C/+1 and pushed for approx 1hr and 40 minutes with epidural.    NSVD female infant, Apgars 8/9, weight pending.   The patient had a 3rd degree laceration repaired with 2-0 and 3-0 vicryl and multiple small vaginal sidewall superficial abrasions requiring 3-0 vicryl.  Ultimately pressure was placed posterior right side to control continued oozing from friable vaginal wall tissue. Fundus was firm. EBL was expected amount, but high normal. Placenta was delivered intact. Vagina was clear.  Delayed cord clamping done for 30-60 seconds while warming baby. Baby was vigorous and doing skin to skin with mother.  Philip Aspen

## 2019-09-22 ENCOUNTER — Ambulatory Visit: Payer: Self-pay

## 2019-10-07 ENCOUNTER — Ambulatory Visit: Payer: Self-pay

## 2019-11-04 ENCOUNTER — Other Ambulatory Visit: Payer: Self-pay

## 2019-11-04 ENCOUNTER — Ambulatory Visit (INDEPENDENT_AMBULATORY_CARE_PROVIDER_SITE_OTHER): Payer: Medicaid Other

## 2019-11-04 DIAGNOSIS — J4550 Severe persistent asthma, uncomplicated: Secondary | ICD-10-CM | POA: Diagnosis not present

## 2019-11-12 LAB — OB RESULTS CONSOLE GBS: GBS: NEGATIVE

## 2019-11-20 ENCOUNTER — Encounter (HOSPITAL_COMMUNITY): Payer: Self-pay | Admitting: *Deleted

## 2019-11-20 ENCOUNTER — Telehealth (HOSPITAL_COMMUNITY): Payer: Self-pay | Admitting: *Deleted

## 2019-11-20 NOTE — Telephone Encounter (Signed)
Preadmission screen  

## 2019-11-21 ENCOUNTER — Other Ambulatory Visit (HOSPITAL_COMMUNITY)
Admission: RE | Admit: 2019-11-21 | Discharge: 2019-11-21 | Disposition: A | Discharge status: Home / Self Care | Payer: Medicaid Other | Source: Ambulatory Visit | Attending: Obstetrics | Admitting: Obstetrics

## 2019-11-21 DIAGNOSIS — Z20822 Contact with and (suspected) exposure to covid-19: Secondary | ICD-10-CM | POA: Insufficient documentation

## 2019-11-21 DIAGNOSIS — Z01812 Encounter for preprocedural laboratory examination: Secondary | ICD-10-CM | POA: Insufficient documentation

## 2019-11-21 LAB — SARS CORONAVIRUS 2 (TAT 6-24 HRS): SARS Coronavirus 2: NEGATIVE

## 2019-11-23 ENCOUNTER — Other Ambulatory Visit: Payer: Self-pay | Admitting: Obstetrics

## 2019-11-24 ENCOUNTER — Inpatient Hospital Stay (HOSPITAL_COMMUNITY): Payer: Medicaid Other | Admitting: Anesthesiology

## 2019-11-24 ENCOUNTER — Inpatient Hospital Stay (HOSPITAL_COMMUNITY)
Admission: AD | Admit: 2019-11-24 | Discharge: 2019-11-27 | DRG: 768 | Disposition: A | Discharge status: Home / Self Care | Payer: Medicaid Other | Attending: Obstetrics & Gynecology | Admitting: Obstetrics & Gynecology

## 2019-11-24 ENCOUNTER — Other Ambulatory Visit: Payer: Self-pay

## 2019-11-24 ENCOUNTER — Inpatient Hospital Stay (HOSPITAL_COMMUNITY): Payer: Medicaid Other

## 2019-11-24 DIAGNOSIS — O99214 Obesity complicating childbirth: Secondary | ICD-10-CM | POA: Diagnosis present

## 2019-11-24 DIAGNOSIS — O3663X0 Maternal care for excessive fetal growth, third trimester, not applicable or unspecified: Principal | ICD-10-CM | POA: Diagnosis present

## 2019-11-24 DIAGNOSIS — Z3A39 39 weeks gestation of pregnancy: Secondary | ICD-10-CM | POA: Diagnosis not present

## 2019-11-24 DIAGNOSIS — O26893 Other specified pregnancy related conditions, third trimester: Secondary | ICD-10-CM | POA: Diagnosis present

## 2019-11-24 DIAGNOSIS — Z3A40 40 weeks gestation of pregnancy: Secondary | ICD-10-CM

## 2019-11-24 LAB — CBC
HCT: 39.4 % (ref 36.0–46.0)
Hemoglobin: 12.7 g/dL (ref 12.0–15.0)
MCH: 30.1 pg (ref 26.0–34.0)
MCHC: 32.2 g/dL (ref 30.0–36.0)
MCV: 93.4 fL (ref 80.0–100.0)
Platelets: 245 10*3/uL (ref 150–400)
RBC: 4.22 MIL/uL (ref 3.87–5.11)
RDW: 14.2 % (ref 11.5–15.5)
WBC: 13.5 10*3/uL — ABNORMAL HIGH (ref 4.0–10.5)
nRBC: 0 % (ref 0.0–0.2)

## 2019-11-24 LAB — RPR: RPR Ser Ql: NONREACTIVE

## 2019-11-24 LAB — TYPE AND SCREEN
ABO/RH(D): O POS
Antibody Screen: NEGATIVE

## 2019-11-24 MED ORDER — LACTATED RINGERS IV SOLN: 500.0000 mL | Freq: Once | INTRAVENOUS | Status: DC

## 2019-11-24 MED ORDER — LIDOCAINE HCL (PF) 1 % IJ SOLN
INTRAMUSCULAR | Status: DC | PRN
  Administered 2019-11-24: 6 mL via EPIDURAL

## 2019-11-24 MED ORDER — FENTANYL CITRATE (PF) 100 MCG/2ML IJ SOLN
50.0000 ug | INTRAMUSCULAR | Status: DC | PRN
  Administered 2019-11-24 (×2): 100 ug via INTRAVENOUS
  Filled 2019-11-24 (×2): qty 2

## 2019-11-24 MED ORDER — LIDOCAINE HCL (PF) 1 % IJ SOLN
30.0000 mL | INTRAMUSCULAR | Status: AC | PRN
  Administered 2019-11-25: 30 mL via SUBCUTANEOUS
  Filled 2019-11-24: qty 30

## 2019-11-24 MED ORDER — FENTANYL-BUPIVACAINE-NACL 0.5-0.125-0.9 MG/250ML-% EP SOLN
12.0000 mL/h | EPIDURAL | Status: DC | PRN
  Filled 2019-11-24: qty 250

## 2019-11-24 MED ORDER — OXYTOCIN 40 UNITS IN NORMAL SALINE INFUSION - SIMPLE MED
1.0000 m[IU]/min | INTRAVENOUS | Status: DC
  Administered 2019-11-24: 2 m[IU]/min via INTRAVENOUS
  Filled 2019-11-24: qty 1000

## 2019-11-24 MED ORDER — TERBUTALINE SULFATE 1 MG/ML IJ SOLN: 0.2500 mg | Freq: Once | INTRAMUSCULAR | Status: DC | PRN

## 2019-11-24 MED ORDER — PHENYLEPHRINE 40 MCG/ML (10ML) SYRINGE FOR IV PUSH (FOR BLOOD PRESSURE SUPPORT): 80.0000 ug | PREFILLED_SYRINGE | INTRAVENOUS | Status: DC | PRN

## 2019-11-24 MED ORDER — ACETAMINOPHEN 325 MG PO TABS: 650.0000 mg | ORAL_TABLET | ORAL | Status: DC | PRN

## 2019-11-24 MED ORDER — EPHEDRINE 5 MG/ML INJ: 10.0000 mg | INTRAVENOUS | Status: DC | PRN

## 2019-11-24 MED ORDER — LACTATED RINGERS IV SOLN: INTRAVENOUS | Status: DC

## 2019-11-24 MED ORDER — SODIUM CHLORIDE (PF) 0.9 % IJ SOLN
INTRAMUSCULAR | Status: DC | PRN
  Administered 2019-11-24: 12 mL/h via EPIDURAL

## 2019-11-24 MED ORDER — OXYTOCIN 40 UNITS IN NORMAL SALINE INFUSION - SIMPLE MED
2.5000 [IU]/h | INTRAVENOUS | Status: DC
  Administered 2019-11-25: 05:00:00 2.5 [IU]/h via INTRAVENOUS

## 2019-11-24 MED ORDER — OXYTOCIN BOLUS FROM INFUSION
500.0000 mL | Freq: Once | INTRAVENOUS | Status: AC
  Administered 2019-11-25: 05:00:00 500 mL via INTRAVENOUS

## 2019-11-24 MED ORDER — SOD CITRATE-CITRIC ACID 500-334 MG/5ML PO SOLN: 30.0000 mL | ORAL | Status: DC | PRN

## 2019-11-24 MED ORDER — DIPHENHYDRAMINE HCL 50 MG/ML IJ SOLN
12.5000 mg | INTRAMUSCULAR | Status: DC | PRN
  Administered 2019-11-24: 22:00:00 12.5 mg via INTRAVENOUS
  Filled 2019-11-24 (×2): qty 1

## 2019-11-24 MED ORDER — ONDANSETRON HCL 4 MG/2ML IJ SOLN: 4.0000 mg | Freq: Four times a day (QID) | INTRAMUSCULAR | Status: DC | PRN

## 2019-11-24 MED ORDER — LACTATED RINGERS IV SOLN: 500.0000 mL | INTRAVENOUS | Status: DC | PRN

## 2019-11-24 MED ORDER — MISOPROSTOL 25 MCG QUARTER TABLET
25.0000 ug | ORAL_TABLET | ORAL | Status: DC | PRN
  Administered 2019-11-24 (×3): 25 ug via VAGINAL
  Filled 2019-11-24 (×3): qty 1

## 2019-11-24 NOTE — Progress Notes (Signed)
Pt comfortable s/p epidural FHT: 125 mod var, Cat 1 TOCO: q2-3 SVE: 2/60/-2 Foley bulb placed Cont. Pitocin 2x2 Other routine care

## 2019-11-24 NOTE — Progress Notes (Signed)
Pt comfortable with epidural FHT 135 mod var +accels, no decels Cat 1 TOCO q2 SVE: 4/70/-1 A/P: Foley bulb fell out, now 4cm AROM clear/blood tinged fluid Will continue pitocin 2x2 Other routine care

## 2019-11-24 NOTE — Anesthesia Procedure Notes (Signed)
Epidural Patient location during procedure: OB Start time: 11/24/2019 3:54 PM End time: 11/24/2019 4:00 PM  Staffing Anesthesiologist: Bethena Midget, MD  Preanesthetic Checklist Completed: patient identified, IV checked, site marked, risks and benefits discussed, surgical consent, monitors and equipment checked, pre-op evaluation and timeout performed  Epidural Patient position: sitting Prep: DuraPrep and site prepped and draped Patient monitoring: continuous pulse ox and blood pressure Approach: midline Location: L2-L3 Injection technique: LOR air  Needle:  Needle type: Tuohy  Needle gauge: 17 G Needle length: 9 cm and 9 Needle insertion depth: 8 cm Catheter type: closed end flexible Catheter size: 19 Gauge Catheter at skin depth: 13 cm Test dose: negative  Assessment Events: blood not aspirated, injection not painful, no injection resistance, no paresthesia and negative IV test

## 2019-11-24 NOTE — Anesthesia Preprocedure Evaluation (Addendum)
Anesthesia Evaluation  Patient identified by MRN, date of birth, ID band Patient awake    Reviewed: Allergy & Precautions, H&P , NPO status , Patient's Chart, lab work & pertinent test results, reviewed documented beta blocker date and time   Airway Mallampati: II  TM Distance: >3 FB Neck ROM: full    Dental no notable dental hx. (+) Teeth Intact, Dental Advisory Given   Pulmonary neg pulmonary ROS,    Pulmonary exam normal breath sounds clear to auscultation       Cardiovascular negative cardio ROS Normal cardiovascular exam Rhythm:regular Rate:Normal     Neuro/Psych negative neurological ROS  negative psych ROS   GI/Hepatic negative GI ROS, Neg liver ROS,   Endo/Other  Morbid obesity  Renal/GU negative Renal ROS  negative genitourinary   Musculoskeletal   Abdominal (+) + obese,   Peds  Hematology negative hematology ROS (+)   Anesthesia Other Findings   Reproductive/Obstetrics (+) Pregnancy                            Anesthesia Physical Anesthesia Plan  ASA: III  Anesthesia Plan: Epidural   Post-op Pain Management:    Induction:   PONV Risk Score and Plan:   Airway Management Planned:   Additional Equipment:   Intra-op Plan:   Post-operative Plan:   Informed Consent: I have reviewed the patients History and Physical, chart, labs and discussed the procedure including the risks, benefits and alternatives for the proposed anesthesia with the patient or authorized representative who has indicated his/her understanding and acceptance.     Dental Advisory Given  Plan Discussed with:   Anesthesia Plan Comments: (Labs checked- platelets confirmed with RN in room. Fetal heart tracing, per RN, reported to be stable enough for sitting procedure. Discussed epidural, and patient consents to the procedure:  included risk of possible headache,backache, failed block, allergic  reaction, and nerve injury. This patient was asked if she had any questions or concerns before the procedure started.)        Anesthesia Quick Evaluation

## 2019-11-24 NOTE — H&P (Signed)
19 y.o. [redacted]w[redacted]d  G1P0 comes in for scheduled elective IOL.  Pt was being seen by MFM for growth scan.  Based on 3/31 scan fetus estimated to be 9#2 and EFW at 39weeks 9#10.  MFM recommended IOL at 39 weeks.  Otherwise has good fetal movement and no bleeding.  Past Medical History:  Diagnosis Date  . Abdominal pain, recurrent   . Asthma    since high school not good control  . Blood transfusion without reported diagnosis     Past Surgical History:  Procedure Laterality Date  . TONSILLECTOMY    . TONSILLECTOMY  2010    OB History  Gravida Para Term Preterm AB Living  1            SAB TAB Ectopic Multiple Live Births               # Outcome Date GA Lbr Len/2nd Weight Sex Delivery Anes PTL Lv  1 Current             Social History   Socioeconomic History  . Marital status: Single    Spouse name: Not on file  . Number of children: Not on file  . Years of education: Not on file  . Highest education level: Not on file  Occupational History  . Not on file  Tobacco Use  . Smoking status: Never Smoker  . Smokeless tobacco: Never Used  Substance and Sexual Activity  . Alcohol use: No  . Drug use: No  . Sexual activity: Never  Other Topics Concern  . Not on file  Social History Narrative   Lives in her own home with her boyfriend and soon to be baby.   Social Determinants of Health   Financial Resource Strain:   . Difficulty of Paying Living Expenses:   Food Insecurity:   . Worried About Charity fundraiser in the Last Year:   . Arboriculturist in the Last Year:   Transportation Needs:   . Film/video editor (Medical):   Marland Kitchen Lack of Transportation (Non-Medical):   Physical Activity:   . Days of Exercise per Week:   . Minutes of Exercise per Session:   Stress:   . Feeling of Stress :   Social Connections:   . Frequency of Communication with Friends and Family:   . Frequency of Social Gatherings with Friends and Family:   . Attends Religious Services:   . Active  Member of Clubs or Organizations:   . Attends Archivist Meetings:   Marland Kitchen Marital Status:   Intimate Partner Violence:   . Fear of Current or Ex-Partner:   . Emotionally Abused:   Marland Kitchen Physically Abused:   . Sexually Abused:    Lac bovis, Lactose intolerance (gi), and Latex    Prenatal Transfer Tool  Maternal Diabetes: No Genetic Screening: Normal Maternal Ultrasounds/Referrals: Other:  R UTD resolved Fetal Ultrasounds or other Referrals:  Referred to Materal Fetal Medicine  Maternal Substance Abuse:  No Significant Maternal Medications:  None Significant Maternal Lab Results: Group B Strep negative  Other PNC: complicated by morbid obesity, LGA fetus    Vitals:   11/24/19 0013 11/24/19 0444 11/24/19 0626 11/24/19 0845  BP: 114/72 122/65 (!) 107/58 118/67  Pulse: (!) 111 91 91 90  Resp: 17 16 16 16   Temp: 98.9 F (37.2 C) 98.7 F (37.1 C)  98.9 F (37.2 C)  TempSrc: Oral Oral  Oral  Weight: 114.4 kg  Height: 5\' 1"  (1.549 m)       Lungs/Cor:  NAD Abdomen:  soft, gravid Ex:  no cords, erythema SVE: 1/50/-3  FHTs:  125, good STV, NST R; Cat 1 tracing. Toco:  q 1-4   A/P   Admitted for IOL at 39 weeks based on MFM recommendation  GBS neg  Epidural when desired  Now s/p cytotec x3, will consider FB with next cytotec placement  06-30-1973

## 2019-11-25 ENCOUNTER — Encounter (HOSPITAL_COMMUNITY): Payer: Self-pay | Admitting: Obstetrics

## 2019-11-25 MED ORDER — SENNOSIDES-DOCUSATE SODIUM 8.6-50 MG PO TABS
2.0000 | ORAL_TABLET | ORAL | Status: DC
  Administered 2019-11-25 – 2019-11-26 (×2): 2 via ORAL
  Filled 2019-11-25 (×2): qty 2

## 2019-11-25 MED ORDER — DIBUCAINE (PERIANAL) 1 % EX OINT: 1.0000 "application " | TOPICAL_OINTMENT | CUTANEOUS | Status: DC | PRN

## 2019-11-25 MED ORDER — ZOLPIDEM TARTRATE 5 MG PO TABS: 5.0000 mg | ORAL_TABLET | Freq: Every evening | ORAL | Status: DC | PRN

## 2019-11-25 MED ORDER — IBUPROFEN 600 MG PO TABS
600.0000 mg | ORAL_TABLET | Freq: Four times a day (QID) | ORAL | Status: DC
  Administered 2019-11-25 – 2019-11-27 (×11): 600 mg via ORAL
  Filled 2019-11-25 (×11): qty 1

## 2019-11-25 MED ORDER — BENZOCAINE-MENTHOL 20-0.5 % EX AERO
1.0000 "application " | INHALATION_SPRAY | CUTANEOUS | Status: DC | PRN
  Administered 2019-11-25: 1 via TOPICAL
  Filled 2019-11-25: qty 56

## 2019-11-25 MED ORDER — TETANUS-DIPHTH-ACELL PERTUSSIS 5-2.5-18.5 LF-MCG/0.5 IM SUSP: 0.5000 mL | Freq: Once | INTRAMUSCULAR | Status: DC

## 2019-11-25 MED ORDER — ONDANSETRON HCL 4 MG/2ML IJ SOLN: 4.0000 mg | INTRAMUSCULAR | Status: DC | PRN

## 2019-11-25 MED ORDER — OXYCODONE-ACETAMINOPHEN 5-325 MG PO TABS: 2.0000 | ORAL_TABLET | ORAL | Status: DC | PRN

## 2019-11-25 MED ORDER — WITCH HAZEL-GLYCERIN EX PADS: 1.0000 "application " | MEDICATED_PAD | CUTANEOUS | Status: DC | PRN

## 2019-11-25 MED ORDER — DIPHENHYDRAMINE HCL 25 MG PO CAPS: 25.0000 mg | ORAL_CAPSULE | Freq: Four times a day (QID) | ORAL | Status: DC | PRN

## 2019-11-25 MED ORDER — ACETAMINOPHEN 325 MG PO TABS: 650.0000 mg | ORAL_TABLET | ORAL | Status: DC | PRN

## 2019-11-25 MED ORDER — SIMETHICONE 80 MG PO CHEW: 80.0000 mg | CHEWABLE_TABLET | ORAL | Status: DC | PRN

## 2019-11-25 MED ORDER — OXYCODONE-ACETAMINOPHEN 5-325 MG PO TABS
1.0000 | ORAL_TABLET | ORAL | Status: DC | PRN
  Administered 2019-11-25 – 2019-11-27 (×3): 1 via ORAL
  Filled 2019-11-25 (×3): qty 1

## 2019-11-25 MED ORDER — COCONUT OIL OIL: 1.0000 "application " | TOPICAL_OIL | Status: DC | PRN

## 2019-11-25 MED ORDER — ONDANSETRON HCL 4 MG PO TABS: 4.0000 mg | ORAL_TABLET | ORAL | Status: DC | PRN

## 2019-11-25 MED ORDER — PRENATAL MULTIVITAMIN CH
1.0000 | ORAL_TABLET | Freq: Every day | ORAL | Status: DC
  Administered 2019-11-26 – 2019-11-27 (×2): 1 via ORAL
  Filled 2019-11-25 (×2): qty 1

## 2019-11-25 NOTE — Lactation Note (Signed)
This note was copied from a baby's chart. Lactation Consultation Note:   Mother is a P1, infant is hours Mother was given [facility] brochure and basic teaching done.   Mother reports that infant has had two feedings .     Reviewed hand expression with mother. Observed large drops of colostrum. Mothers nipples are erect with compressible breast tissue. Observed tiny drops of colostrum.    She is active with [facility] .Marland Kitchen   Mother was observed with infant latched on at the right breast. Observed infant suckling with audible swallows.   Mother to continue to cue base feed infant and feed at least 8-12 times or more in 24 hours and advised to allow for cluster feeding infant as needed.  Mother to continue to due STS. Mother is aware of available LC services at [facility], BFSG'S, OP Dept, and phone # for questions or concerns about breastfeeding.  Mother receptive to all teaching and plan of care.    Patient Name: Ariel Mooney Date: 11/25/2019 Reason for consult: Initial assessment   Maternal Data    Feeding Feeding Type: (P) Breast Fed  LATCH Score                   Interventions    Lactation Tools Discussed/Used WIC Program: Yes Pump Review: Setup, frequency, and cleaning;Milk Storage Initiated by:: Ariel Born RN,IBCLC Date initiated:: 11/26/19   Consult Status      Ariel Mooney McCoy 11/25/2019, 3:04 PM

## 2019-11-25 NOTE — Anesthesia Postprocedure Evaluation (Signed)
Anesthesia Post Note  Patient: KEVONA LUPINACCI  Procedure(s) Performed: AN AD HOC LABOR EPIDURAL     Patient location during evaluation: Mother Baby Anesthesia Type: Epidural Level of consciousness: awake and alert and oriented Pain management: satisfactory to patient Vital Signs Assessment: post-procedure vital signs reviewed and stable Respiratory status: respiratory function stable Cardiovascular status: stable Postop Assessment: no headache, no backache, epidural receding, patient able to bend at knees, no signs of nausea or vomiting and adequate PO intake Anesthetic complications: no    Last Vitals:  Vitals:   11/25/19 0906 11/25/19 1257  BP: 124/76 121/72  Pulse: 98 86  Resp: 16 18  Temp: 36.7 C 36.7 C  SpO2: 100% 100%    Last Pain:  Vitals:   11/25/19 1258  TempSrc:   PainSc: 0-No pain   Pain Goal:                   Aldrich Lloyd

## 2019-11-25 NOTE — Progress Notes (Signed)
Post Partum Day 0 Subjective: no complaints. Reports pain well controlled except when sitting in wheelchair for transport to postpartum.   Objective: Vitals:   11/25/19 0716 11/25/19 0731 11/25/19 0810 11/25/19 0906  BP: 120/86 126/71 125/77 124/76  Pulse: 99 100 (!) 104 98  Resp: 18 16 16 16   Temp:   98 F (36.7 C) 98 F (36.7 C)  TempSrc:   Oral Oral  SpO2:   100% 100%  Weight:      Height:         Physical Exam:  General: alert and no distress Uterine Fundus: firm Pelvic: Perineum swollen, bleeding appropriate  DVT Evaluation: No evidence of DVT seen on physical exam.  Recent Labs    11/24/19 0024  WBC 13.5*  HGB 12.7  HCT 39.4  PLT 245   Assessment/Plan:  01/24/20 18 y.o. G1P1001 PPD#0 sp SVD 1. PPC: 3rd degree laceration, discussed importance of preventing constipation. Continue ice packs for perineum swelling  LOS: 1 day   Lanier Millon K Taam-Akelman 11/25/2019, 10:10 AM

## 2019-11-26 LAB — CBC
HCT: 30.4 % — ABNORMAL LOW (ref 36.0–46.0)
Hemoglobin: 9.7 g/dL — ABNORMAL LOW (ref 12.0–15.0)
MCH: 29.9 pg (ref 26.0–34.0)
MCHC: 31.9 g/dL (ref 30.0–36.0)
MCV: 93.8 fL (ref 80.0–100.0)
Platelets: 173 10*3/uL (ref 150–400)
RBC: 3.24 MIL/uL — ABNORMAL LOW (ref 3.87–5.11)
RDW: 14.6 % (ref 11.5–15.5)
WBC: 16.4 10*3/uL — ABNORMAL HIGH (ref 4.0–10.5)
nRBC: 0 % (ref 0.0–0.2)

## 2019-11-26 MED ORDER — DOCUSATE SODIUM 100 MG PO CAPS: 100.0000 mg | ORAL_CAPSULE | Freq: Two times a day (BID) | ORAL | 0 refills | Status: DC

## 2019-11-26 MED ORDER — IBUPROFEN 600 MG PO TABS: 600.0000 mg | ORAL_TABLET | Freq: Four times a day (QID) | ORAL | 0 refills | Status: DC | PRN

## 2019-11-26 MED ORDER — OXYCODONE-ACETAMINOPHEN 5-325 MG PO TABS: 1.0000 | ORAL_TABLET | Freq: Four times a day (QID) | ORAL | 0 refills | Status: DC | PRN

## 2019-11-26 NOTE — Lactation Note (Addendum)
This note was copied from a baby's chart. Lactation Consultation Note  Patient Name: Ariel Mooney [id number] Date: 11/26/2019 Reason for consult: Follow-up assessment;Term;Primapara;1st time breastfeeding  P1 mother whose infant is now 30 hours old.  This is a term baby at 39+1 weeks.  Bilirubin level of 9.7 mg/dl at approximately 30 hours of life.  Baby is under phototherapy.  Mother's feeding preference is breast/bottle.  RN was in room assisting with latching when I arrived.  Baby was sleepy and not interested in feeding.  Offered to initiate the DEBP and mother willing to pump.  Pump parts, assembly, disassembly and cleaning reviewed.  #24 flange size is not appropriate and changed  mother to a #27 flange size.  After a few minutes of pumping, mother's right nipple enlarged greatly and changed the flange size to a #30 for greater fit and comfort.  Mother had no pain with this size flange.  Baby began to get fussy and irritable.  Stopped pumping and attempted to latch to the right breast in the football hold.  Baby latched, however, he pushed back and became very angry at the breast.  Attempted to burp; baby passing gas.  Latched back to the breast, however, he again pushed back and became fussy.  Placed him in the bassinet under phototherapy.  Started mother pumping again.  Breast feeding basics discussed during pumping.  Reviewed STS, feeding cues, hand expression, pumping after breast feeding attempts and supplementation.  Mother prefers to use formula for supplementation. Baby continued to be fussy after placing him in the bassinet.  Asked mother's permission to supplement while she continued to pump.  Showed mother how to do paced bottle feeding.  Baby consumed 10 mls easily of Gerber GoodStart formula using the gold slow flow nipple.  Demonstrated burping.  Father returned at the end of the feeding and discussed how to keep the feeding log updated.  At the end of mother's pumping session  she had a few colostrum drops in the flanges.  Asked father to pick up the drops with his finger and finger feed the drops back to baby.  Father happy to assist.  Encouraged mother to feed back at least every three hours or sooner if he desires.  She will supplement with Rush Barer and pump for 15 minutes after supplementation.  Mother will call for latch assistance as needed.  RN updated.  Mother is a [employer] participant in [place].  [facility] referral faxed for DEBP.     Maternal Data Formula Feeding for Exclusion: Yes Reason for exclusion: Mother's choice to formula and breast feed on admission Has patient been taught Hand Expression?: Yes Does the patient have breastfeeding experience prior to this delivery?: No  Feeding Feeding Type: Bottle Fed - Formula Nipple Type: Slow - flow  LATCH Score                   Interventions    Lactation Tools Discussed/Used Tools: Bottle;Pump;Flanges Flange Size: 30 Breast pump type: Double-Electric Breast Pump WIC Program: Yes Pump Review: Setup, frequency, and cleaning;Milk Storage Initiated by:: Ariel Mooney Date initiated:: 11/26/19   Consult Status Consult Status: Follow-up Date: 11/27/19 Follow-up type: In-patient    Dora Sims 11/26/2019, 12:46 PM

## 2019-11-26 NOTE — Discharge Summary (Signed)
Obstetric Discharge Summary Reason for Admission: onset of labor Prenatal Procedures: ultrasound Intrapartum Procedures: spontaneous vaginal delivery Postpartum Procedures: none Complications-Operative and Postpartum: 3rd degree perineal laceration Hemoglobin  Date Value Ref Range Status  11/26/2019 9.7 (L) 12.0 - 15.0 g/dL Final  35/39/1225 83.4 11.1 - 15.9 g/dL Final   HCT  Date Value Ref Range Status  11/26/2019 30.4 (L) 36.0 - 46.0 % Final   Hematocrit  Date Value Ref Range Status  10/22/2018 41.8 34.0 - 46.6 % Final    Physical Exam:  General: alert, cooperative and appears stated age 82: appropriate Uterine Fundus: firm Incision: healing well DVT Evaluation: No evidence of DVT seen on physical exam.  Discharge Diagnoses: Term Pregnancy-delivered  Discharge Information: Date: 11/26/2019 Activity: pelvic rest Diet: routine Medications: Ibuprofen and Percocet Condition: improved Instructions: refer to practice specific booklet Discharge to: home Follow-up Information    Philip Aspen, DO Follow up in 4 week(s).   Specialty: Obstetrics and Gynecology Why: For a postpartum evaluation Contact information: [address] [place] [address] [phone]           Newborn Data: Live born female  Birth Weight: 8 lb 9.7 oz (3905 g) APGAR: 8, 9  Newborn Delivery   Birth date/time: 11/25/2019 04:52:00 Delivery type: Vaginal, Spontaneous      Home with mother.  Waynard Reeds 11/26/2019, 11:00 AM

## 2019-11-27 NOTE — Lactation Note (Signed)
This note was copied from a baby's chart. Lactation Consultation Note:  Mother reports that [facility] phoned her today. She will make an appt next week, Mother reports that she will just use her hand pump . Mother advised to begin to pump every 2-3 hours for 15 mins on each breast.   Infant in under double phone tx. Blood draw at 3 pm. And will determine if discharge. Discussed treatment and prevention for engorgement.   Plan of Care : Breastfeed infant with feeding cues Supplement infant with ebm/formula, according to supplemental guidelines. Pump using a DEBP after each feeding for 15-20 mins.   Mother to continue to cue base feed infant and feed at least 8-12 times or more in 24 hours and advised to allow for cluster feeding infant as needed.   Mother to continue to due STS. Mother is aware of available LC services at [facility], BFSG'S, OP Dept, and phone # for questions or concerns about breastfeeding.  Mother receptive to all teaching and plan of care.    Patient Name: Boy Sharonica Kraszewski [id number] Date: 11/27/2019 Reason for consult: Follow-up assessment   Maternal Data    Feeding Feeding Type: Bottle Fed - Formula Nipple Type: Slow - flow  LATCH Score                   Interventions    Lactation Tools Discussed/Used     Consult Status      Michel Bickers 11/27/2019, 10:27 AM

## 2019-11-27 NOTE — Progress Notes (Signed)
PPD#2 Pt is doing well. Her baby is still under the bili lights. VSSAF IMP/ stable Plan/ will discharge mother          Will do circ when able

## 2019-11-28 ENCOUNTER — Ambulatory Visit: Payer: Self-pay

## 2019-11-28 NOTE — Lactation Note (Signed)
This note was copied from a baby's chart. Lactation Consultation Note:  Arrived to see mother and mother was pumping. She had pumped approx 20 ml at the time. Mother reports that infant is breastfeeding some , mostly bottle feeding. Discussed and encouraged frequent pumping every 2-3 hours for 15-20 mins. Mother receptive to all teaching.  She is undecided about a [facility] Loan[employer] pump if she is discharged today. She will page as needed.   Patient Name: Ariel Mooney [id number] Date: 11/28/2019     Maternal Data    Feeding Feeding Type: Bottle Fed - Breast Milk  LATCH Score                   Interventions    Lactation Tools Discussed/Used     Consult Status      Michel Bickers 11/28/2019, 4:53 PM

## 2019-11-29 ENCOUNTER — Ambulatory Visit: Payer: Self-pay

## 2019-11-29 NOTE — Lactation Note (Signed)
This note was copied from a baby's chart. Lactation Consultation Note  Patient Name: Ariel Mooney HYIFO'Y Date: 11/29/2019   Infant is 33 days old & is going home today. "[name]" gained 110 grams overnight. He is being bottle-fed with EBM & formula. Stools are yellow & seedy.  Mom last pumped around 0200. Mom asked me for size 36 flanges b/c she felt that the size 30 flanges are now rubbing. At rest, mom's nipples appear to be a size 21 flange. I asked Mom if she would be willing try size 21 flanges with me & she agreed. I lubricated them with coconut oil & observed Mom pump. Her nipples did fill the tunnel, but she said these were the most comfortable feeling of all the flanges she had tried. She was able to pump 2 oz in about 10 minutes.   I believe that Mom has truly elastic nipples, so I suggested that she look at [email or web address] for flanges that are more appropriate for her. Mom rented a [facility] loaner. Instructions were discussed on how to return it.  When I inquired how infant was doing with the yellow slow-flow nipples, Mom said "gulping" and "seems like he's choking, so I take the bottle out of his mouth & pat him." I provided them with Enfamil Extra-slow flow nipples, instead. Parents have Dr. Theora Gianotti Newborn nipples to try at home.  Parents know how to reach [place] for any post-discharge questions.    Lurline Hare [facility] 11/29/2019, 9:56 AM

## 2019-12-30 ENCOUNTER — Ambulatory Visit: Payer: Self-pay

## 2020-03-31 ENCOUNTER — Telehealth: Payer: Self-pay

## 2020-03-31 ENCOUNTER — Other Ambulatory Visit: Payer: Self-pay | Admitting: Allergy

## 2020-03-31 NOTE — Telephone Encounter (Signed)
Ok.  She would need to start over with the monthly x 3 then every 8 weeks.

## 2020-03-31 NOTE — Telephone Encounter (Signed)
Called patient and left a detailed voicemail per DPR permission advising that Ariel Mooney will reach out to her once she has reapproved her medicine and it has been ordered.

## 2020-03-31 NOTE — Telephone Encounter (Signed)
Patient is wanting to restart her Fasenra injections. Last injection was given 11/04/2019. Patient was pregnant during this time and now ready to start again. She stated she was getting Fasenra every 8 weeks. Please advise on this Dr. [name].

## 2020-04-01 NOTE — Telephone Encounter (Signed)
Thanks Tammy 

## 2020-04-01 NOTE — Telephone Encounter (Signed)
T/C to patient to advise that she will need MD appt and CBC to get approval for Harrington Challenger since she has to restart and has not been seen since last August.  Appt made for 8/25.  Also advised patient that [facility] will not be accepting her managed MCD plan [employer] and will need to go online and change to one of plan [facility] does take.

## 2020-04-06 IMAGING — MR MRI CERVICAL SPINE WITHOUT CONTRAST
5 series · 30 of 48 positions shown · non-contrast
Comparison: Cervical spine radiographs 09/30/2018, CT 02/22/2011

CLINICAL DATA: Neck pain.  Radicular symptoms right arm

EXAM:
MRI CERVICAL SPINE WITHOUT CONTRAST
TECHNIQUE: Multiplanar, multisequence MR imaging of the cervical spine was
performed. No intravenous contrast was administered.

[Series 2: T2 · sagittal · 3.0mm · 0.41mm/px · 6 of 13 slices shown (1 of 2)]
[im 1/13]
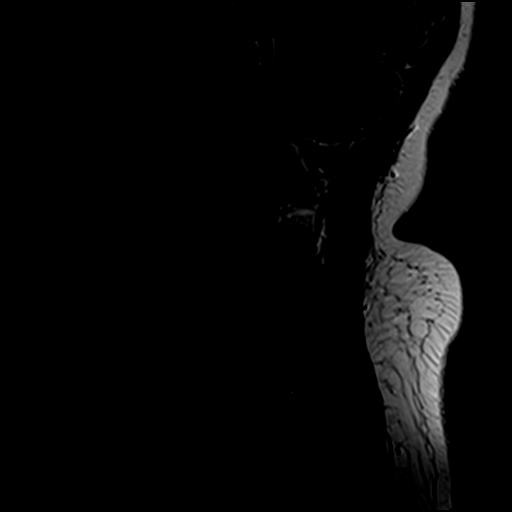
[im 3/13]
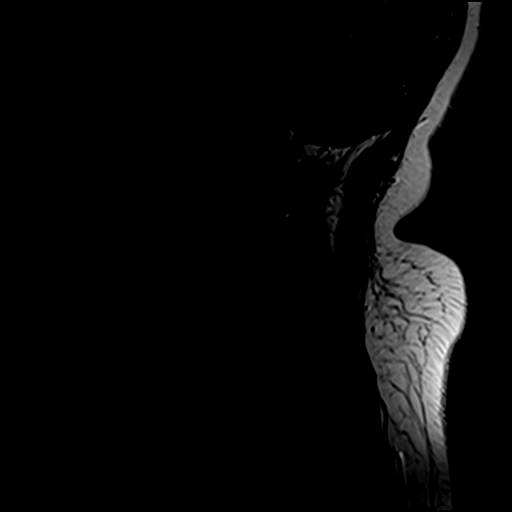
[im 5/13]
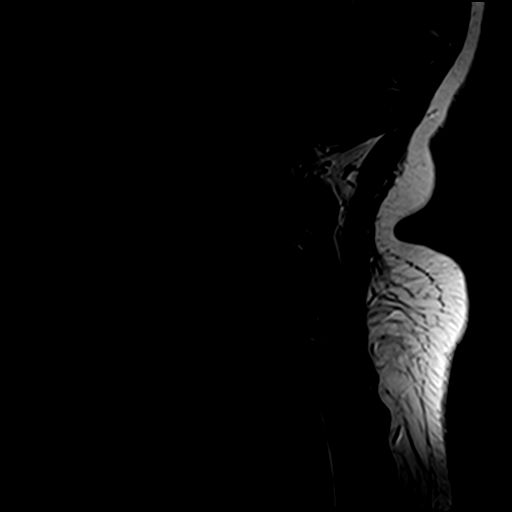
[im 8/13]
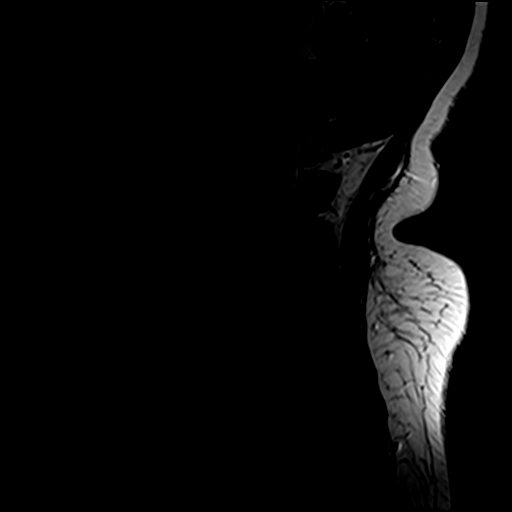
[im 10/13]
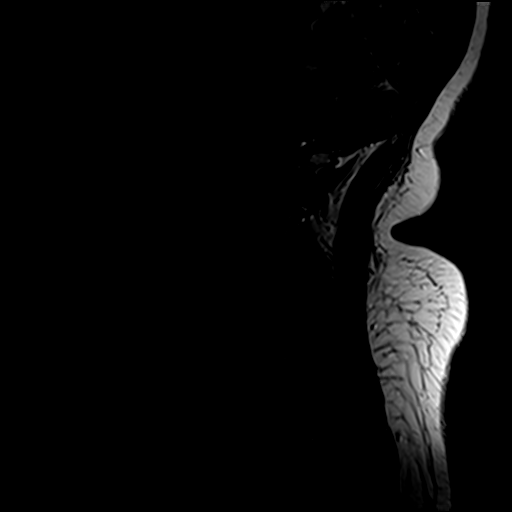
[im 13/13]
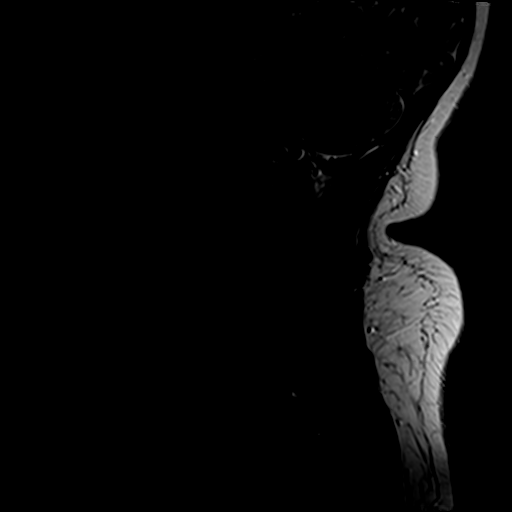

[Series 3: T1 · sagittal · 3.0mm · 0.41mm/px · 7 of 13 slices shown]
[im 1/13]
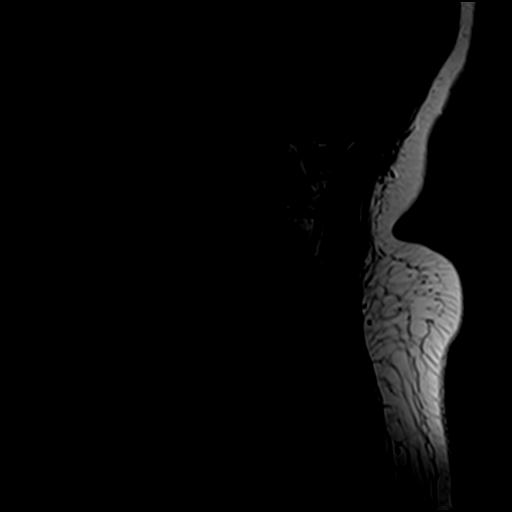
[im 3/13]
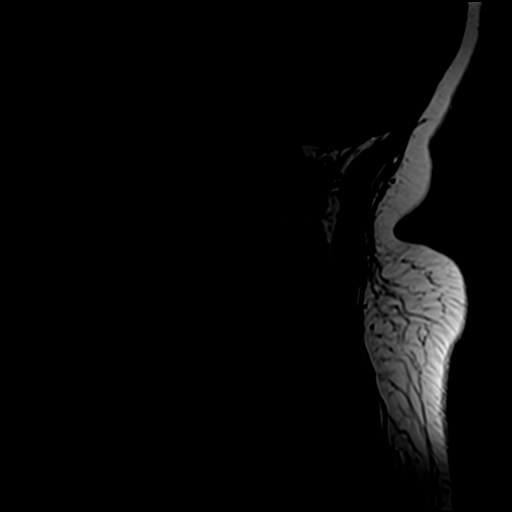
[im 5/13]
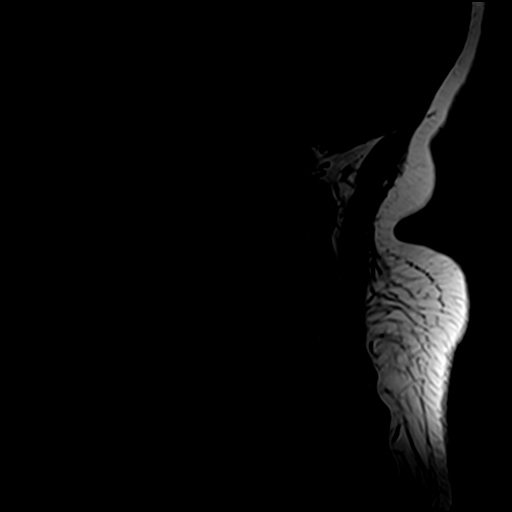
[im 7/13]
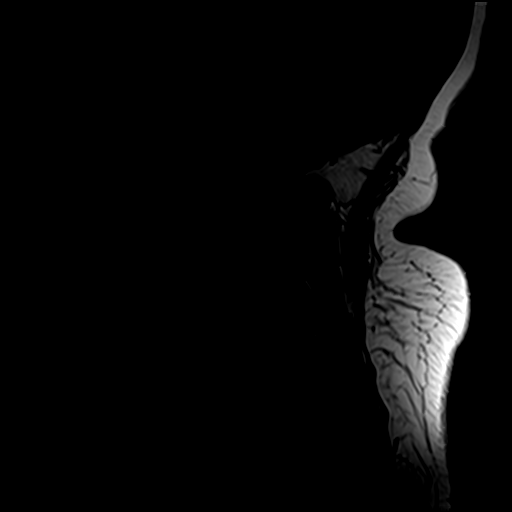
[im 9/13]
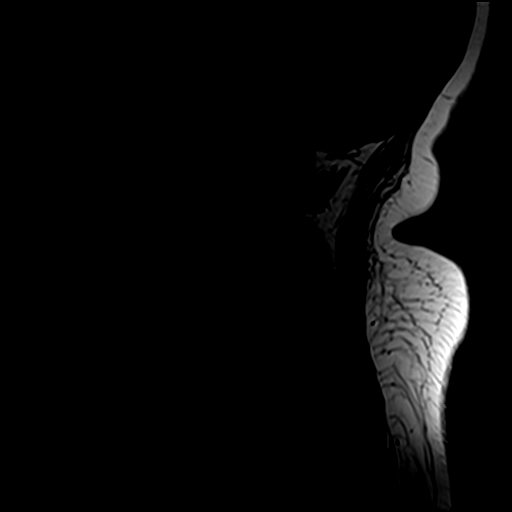
[im 11/13]
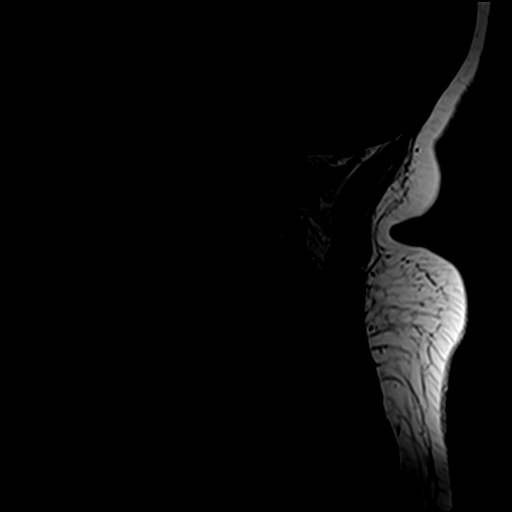
[im 13/13]
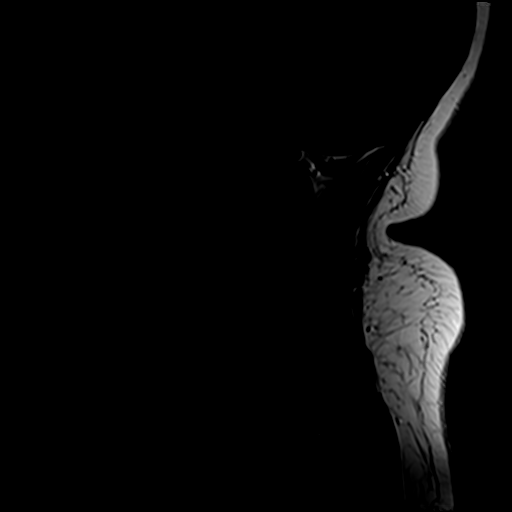

[Series 4: STIR · sagittal · 3.0mm · 0.82mm/px · 7 of 13 slices shown]
[im 1/13]
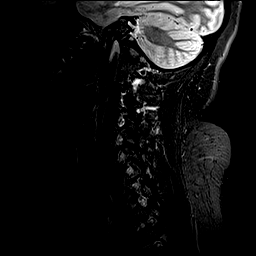
[im 3/13]
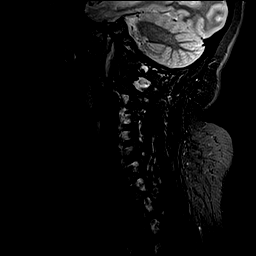
[im 5/13]
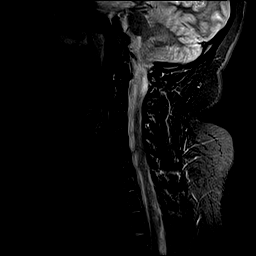
[im 7/13]
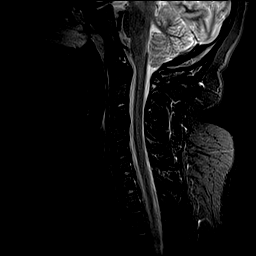
[im 9/13]
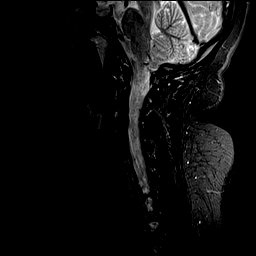
[im 11/13]
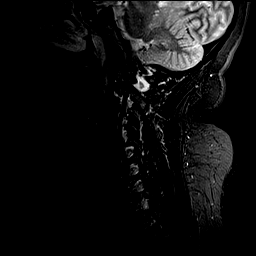
[im 13/13]
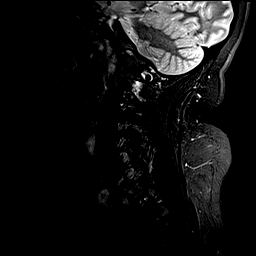

[Series 5: GRE · axial · 3.0mm · 0.35mm/px · z∈[-60,-45]mm · 2 of 27 slices shown]
[im 1/27]
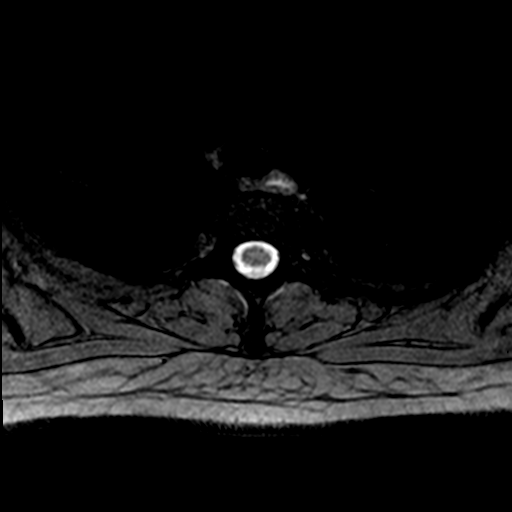
[im 5/27]
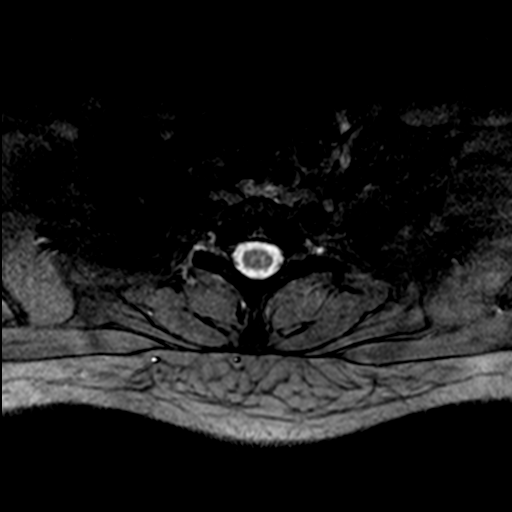

[Series 6: T2 · axial · 3.0mm · 0.70mm/px · z∈[-60,+34]mm · 8 of 27 slices shown (2 of 2)]
[im 1/27]
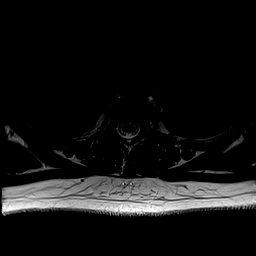
[im 5/27]
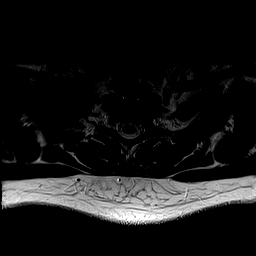
[im 9/27]
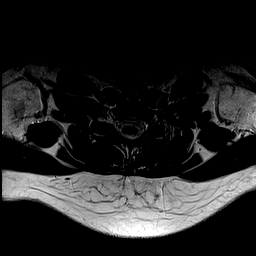
[im 13/27]
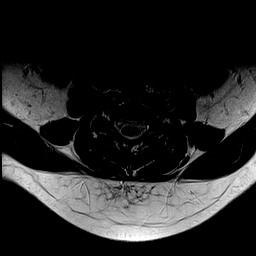
[im 15/27]
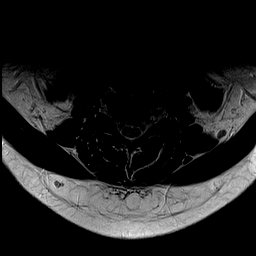
[im 19/27]
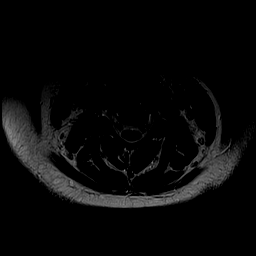
[im 23/27]
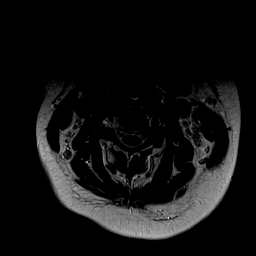
[im 27/27]
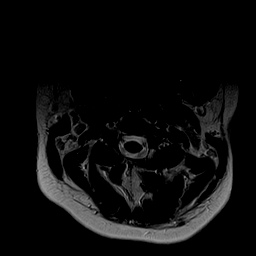

[30 of 48 positions shown; findings below may reference images not displayed]

FINDINGS: Alignment: Normal

Vertebrae: Normal bone marrow.  Negative for fracture or mass.

Cord: Allowing for mild motion, cord signal is normal.

Posterior Fossa, vertebral arteries, paraspinal tissues: Negative

Disc levels:

Slight disc desiccation and bulging at C3-4. Otherwise normal disc
spaces. No focal disc protrusion or spinal stenosis.
IMPRESSION: Slight disc degeneration C3-4 otherwise normal exam. Exam quality
degraded by mild motion.

## 2020-04-13 ENCOUNTER — Ambulatory Visit: Payer: Medicaid Other | Admitting: Allergy

## 2020-04-18 ENCOUNTER — Other Ambulatory Visit: Payer: Self-pay

## 2020-04-18 ENCOUNTER — Encounter: Payer: Self-pay | Admitting: Family

## 2020-04-18 ENCOUNTER — Ambulatory Visit (INDEPENDENT_AMBULATORY_CARE_PROVIDER_SITE_OTHER): Payer: Medicaid Other | Admitting: Family

## 2020-04-18 DIAGNOSIS — J4551 Severe persistent asthma with (acute) exacerbation: Secondary | ICD-10-CM

## 2020-04-18 DIAGNOSIS — J3089 Other allergic rhinitis: Secondary | ICD-10-CM

## 2020-04-18 MED ORDER — PREDNISONE 10 MG PO TABS: ORAL_TABLET | ORAL | 0 refills | Status: AC

## 2020-04-18 NOTE — Progress Notes (Signed)
RE: Ariel Mooney MRN: [id number] DOB: [birth year] Date of Telemedicine Visit: 04/18/2020  Referring provider: [name], NP Primary care provider: [name], NP  Chief Complaint: Asthma (She is having shortness of breath starting about a month ago. )   Telemedicine Follow Up Visit via Telephone: I connected with Ariel Mooney for a follow up on 04/18/20 by telephone and verified that I am speaking with the correct person using two identifiers.   I discussed the limitations, risks, security and privacy concerns of performing an evaluation and management service by telephone and the availability of in person appointments. I also discussed with the patient that there may be a patient responsible charge related to this service. The patient expressed understanding and agreed to proceed.  Patient is at her child's pediatrician's office.  Provider is at the office.  Visit start time: 11:26 AM Visit end time: 11:50 AM Insurance consent/check in by: front desk Medical consent and medical assistant/nurse: Lucretia K History of Present Illness: She is a 19 y.o. female, who is being followed for severe persistent asthma and seasonal and perennial allergic rhinoconjunctivitis.. Her previous allergy office visit was on April 16, 2019 with Dr. [name]. Today is an acute visit.  Severe persistent asthma is reported as not well controlled with Dulera 200 mcg 2 puffs twice a day with a spacer, Spiriva 1.25 mcg 2 puffs twice a day, and albuterol as needed.  She stopped the Symbicort 160/4.5 mcg 2 puffs twice a day after the birth of her child in April and restarted her Dulera 200 mcg.  She has not taken  Singulair since being pregnant due to feeling like she did not need it.  She also has stopped getting her Fasenra injections back in March due to not having someone to watch her child while she got her injections.  She reports that during her pregnancy her asthma was excellent,  but since having her child her asthma has been 10 times worse.  She reports that her asthma symptoms have been coming back gradually worse, but this past month has been the worst. She is currently breast feeding. She reports nocturnal awakenings, dry cough, wheezing, tightness in her chest, shortness of breath with rest and exertion.  She denies any trips to the emergency room or urgent care due to her asthma and has not required any systemic steroids since her last office visit.  She is currently having to use her albuterol 4 times a day with no relief.  She is able to speak in full sentences.  She denies any fevers or chills.  Seasonal and perennial allergic rhinoconjunctivitis is reported as controlled.  She is currently not taking Singulair 10 mg once a day.  She denies any rhinorrhea, nasal congestion, postnasal drip, and itchy watery eyes.  Current medications are as listed in the chart.  Assessment and Plan: Severe persistent asthma with exacerbation Start prednisone 10 mg- take two tablets twice a day for 3 days,then on the 4th day take 2 tablets in the morning and on the 5th day take 1 tablet and then stop. Continue Dulera 200-2 puffs twice a day with spacer to help prevent cough and wheeze. Decrease Spiriva 1.25 mcg 2 puffs once a day to help prevent cough and wheeze. Start Singulair 10 mg once a day to help prevent cough and wheeze. Patient cautioned that rarely some children/adults can experience behavioral changes after beginning montelukast. These side effects are rare, however, if you notice any change, notify the  clinic and discontinue montelukast. May use albuterol 2 puffs every 4 hours as needed for cough, wheeze, tightness in chest, or shortness of breath. I  Seasonal and perennial allergic rhinoconjunctivitis (2015 test was positive to grass, weed, tree, mold, dust mite, cat, cockroach, mouse) Continue Singulair 10 mg once a day Continue avoidance measures. May use an  over-the-counter antihistamine such as Claritin, Zyrtec, Allegra, or Xyzal once a day as needed for runny nose.  Please let us know if this treatment plan is not working well for you. Schedule follow-up appointment in 2 weeks  Lab Orders  No laboratory test(s) ordered today    Diagnostics: None.  Medication List:  Current Outpatient Medications  Medication Sig Dispense Refill  . albuterol (VENTOLIN HFA) 108 (90 Base) MCG/ACT inhaler INHALE 2 PUFFS INTO THE LUNGS EVERY 4 HOURS AS NEEDED FOR WHEEZING OR SHORTNESS OF BREATH    . ibuprofen (ADVIL) 600 MG tablet Take 1 tablet (600 mg total) by mouth every 6 (six) hours as needed. 90 tablet 0  . PROAIR HFA 108 (90 Base) MCG/ACT inhaler Inhale 2 puffs into the lungs every 4 (four) hours as needed.    Marland Kitchen SPIRIVA RESPIMAT 1.25 MCG/ACT AERS SMARTSIG:2 Puff(s) Via Inhaler Daily    . SYMBICORT 160-4.5 MCG/ACT inhaler Inhale 2 puffs into the lungs 2 (two) times daily.    Marland Kitchen docusate sodium (COLACE) 100 MG capsule Take 1 capsule (100 mg total) by mouth 2 (two) times daily. (Patient not taking: Reported on 04/18/2020) 60 capsule 0  . oxyCODONE-acetaminophen (PERCOCET/ROXICET) 5-325 MG tablet Take 1-2 tablets by mouth every 6 (six) hours as needed for severe pain. (Patient not taking: Reported on 04/18/2020) 30 tablet 0   No current facility-administered medications for this visit.   Allergies: Allergies  Allergen Reactions  . Lac Bovis Hives  . Lactose Intolerance (Gi) Hives and Shortness Of Breath  . Latex Shortness Of Breath and Rash   I reviewed her past medical history, social history, family history, and environmental history and no significant changes have been reported from her previous visit.  Review of Systems  Constitutional: Negative for chills and fever.  HENT: Negative for congestion, postnasal drip and rhinorrhea.   Eyes: Negative for itching.  Respiratory: Positive for cough, chest tightness, shortness of breath and wheezing.     Cardiovascular: Negative for chest pain and palpitations.  Gastrointestinal: Negative for constipation.  Genitourinary: Negative for difficulty urinating.  Allergic/Immunologic: Positive for environmental allergies.  Neurological: Negative for headaches.   Objective: Physical Exam Not obtained as encounter was done via telephone.   Previous notes and tests were reviewed.  I discussed the assessment and treatment plan with the patient. The patient was provided an opportunity to ask questions and all were answered. The patient agreed with the plan and demonstrated an understanding of the instructions. After visit summary/patient instructions available via mailed to her.   The patient was advised to call back or seek an in-person evaluation if the symptoms worsen or if the condition fails to improve as anticipated.  I provided 24 minutes of non-face-to-face time during this encounter.  It was my pleasure to participate in Sharpes Zellers's care today. Please feel free to contact me with any questions or concerns.   Sincerely,  Nehemiah Settle, FNP  [facility] office: [phone] [facility] office: [phone] [place] office: [phone]

## 2020-04-18 NOTE — Patient Instructions (Addendum)
Severe persistent asthma with exacerbation Start prednisone 10 mg- take two tablets twice a day for 3 days,then on the 4th day take 2 tablets in the morning and on the 5th day take 1 tablet and then stop. Continue Dulera 200-2 puffs twice a day with spacer to help prevent cough and wheeze. Decrease Spiriva 1.25 mcg 2 puffs once a day to help prevent cough and wheeze. Start Singulair 10 mg once a day to help prevent cough and wheeze. Patient cautioned that rarely some children/adults can experience behavioral changes after beginning montelukast. These side effects are rare, however, if you notice any change, notify the clinic and discontinue montelukast. May use albuterol 2 puffs every 4 hours as needed for cough, wheeze, tightness in chest, or shortness of breath. I  Seasonal and perennial allergic rhinoconjunctivitis (2015 test was positive to grass, weed, tree, mold, dust mite, cat, cockroach, mouse) Continue Singulair 10 mg once a day Continue avoidance measures. May use an over-the-counter antihistamine such as Claritin, Zyrtec, Allegra, or Xyzal once a day as needed for runny nose.  Please let us know if this treatment plan is not working well for you. Schedule follow-up appointment in 2 weeks

## 2020-04-22 ENCOUNTER — Telehealth: Payer: Self-pay

## 2020-04-22 NOTE — Telephone Encounter (Signed)
Devoria Glassing, CMA  [facility] Patient was seen 8/30 can someone make sure patient was advised that after 8/31 we will not be taking her Ins [employer] will need to change. She has only 30 days to change otherwise find new MD Thanks, tammy    Lm for pt to call us back about this matter

## 2020-04-26 ENCOUNTER — Other Ambulatory Visit: Payer: Self-pay | Admitting: Allergy

## 2020-04-26 ENCOUNTER — Other Ambulatory Visit: Payer: Self-pay

## 2020-04-26 ENCOUNTER — Telehealth: Payer: Self-pay

## 2020-04-26 ENCOUNTER — Ambulatory Visit (INDEPENDENT_AMBULATORY_CARE_PROVIDER_SITE_OTHER): Payer: Medicaid Other | Admitting: Allergy & Immunology

## 2020-04-26 ENCOUNTER — Encounter: Payer: Self-pay | Admitting: Allergy & Immunology

## 2020-04-26 DIAGNOSIS — J3089 Other allergic rhinitis: Secondary | ICD-10-CM | POA: Diagnosis not present

## 2020-04-26 DIAGNOSIS — J4551 Severe persistent asthma with (acute) exacerbation: Secondary | ICD-10-CM

## 2020-04-26 DIAGNOSIS — J0100 Acute maxillary sinusitis, unspecified: Secondary | ICD-10-CM | POA: Diagnosis not present

## 2020-04-26 MED ORDER — MONTELUKAST SODIUM 10 MG PO TABS: 10.0000 mg | ORAL_TABLET | Freq: Every day | ORAL | 5 refills | Status: DC

## 2020-04-26 MED ORDER — AMOXICILLIN-POT CLAVULANATE 875-125 MG PO TABS: 1.0000 | ORAL_TABLET | Freq: Two times a day (BID) | ORAL | 0 refills | Status: AC

## 2020-04-26 NOTE — Patient Instructions (Addendum)
1. Severe persistent asthma with acute exacerbation - Lung testing deferred today. - We did give you a puffs of Xopenex today and your air movement was much better. - Start the prednisone taper provided.  2. Allergic rhinitis - Continue with Singulair 10 mg daily. - Continue with Xyzal 5 mg daily. - Consider allergy shots as a means of long-term control.  3. Acute non-recurrent maxillary sinusitis - With your current symptoms and time course, antibiotics are needed: Augmentin 875mg  twice daily for 14 days - Add on nasal saline spray (i.e., Simply Saline) or nasal saline lavage (i.e., NeilMed) as needed prior to medicated nasal sprays. - For thick post nasal drainage, add guaifenesin [phone] mg (Mucinex) twice daily as needed for mucous thinning with adequate hydration to help it work.   4. Return in about 4 weeks (around 05/24/2020).    Please inform 07/24/2020 of any Emergency Department visits, hospitalizations, or changes in symptoms. Call us before going to the ED for breathing or allergy symptoms since we might be able to fit you in for a sick visit. Feel free to contact us anytime with any questions, problems, or concerns.  It was a pleasure to meet you today!  Websites that have reliable patient information: 1. American Academy of Asthma, Allergy, and Immunology: www.aaaai.org 2. Food Allergy Research and Education (FARE): foodallergy.org 3. Mothers of Asthmatics: http://www.asthmacommunitynetwork.org 4. American College of Allergy, Asthma, and Immunology: www.acaai.org   COVID-19 Vaccine Information can be found at: [place] For questions related to vaccine distribution or appointments, please email [email or web address] or call [phone].     "Like" [phone] on Facebook and Instagram for our latest updates!        Make sure you are registered to vote! If you have moved or changed any of your contact  information, you will need to get this updated before voting!  In some cases, you MAY be able to register to vote online: [place]

## 2020-04-26 NOTE — Progress Notes (Signed)
FOLLOW UP  Date of Service/Encounter:  04/26/20   Assessment:   Severe persistent asthma with acute exacerbation  Seasonal and perennial allergic rhinitis  Acute sinusitis  Plan/Recommendations:   1. Severe persistent asthma with acute exacerbation - Lung testing deferred today. - We did give you a puffs of Xopenex today and your air movement was much better. - Start the prednisone taper provided. - Continue with Dulera two puffs twice daily + Spiriva two puffs once daily.  - We will get the [place] back on board.   2. Allergic rhinitis - Continue with Singulair 10 mg daily. - Continue with Xyzal 5 mg daily. - Consider allergy shots as a means of long-term control.  3. Acute non-recurrent maxillary sinusitis - With your current symptoms and time course, antibiotics are needed: Augmentin 875mg  twice daily for 14 days - Add on nasal saline spray (i.e., Simply Saline) or nasal saline lavage (i.e., NeilMed) as needed prior to medicated nasal sprays. - For thick post nasal drainage, add guaifenesin [phone] mg (Mucinex) twice daily as needed for mucous thinning with adequate hydration to help it work.   4. Return in about 4 weeks (around 05/24/2020).   Subjective:   ENGLISH TOMER is a 19 y.o. female presenting today for follow up of  Chief Complaint  Patient presents with   Asthma   Shortness of Breath    LIBNI FUSARO has a history of the following: Patient Active Problem List   Diagnosis Date Noted   [redacted] weeks gestation of pregnancy 11/24/2019   Severe persistent asthma with acute exacerbation 02/09/2019   Seasonal and perennial allergic rhinoconjunctivitis 02/09/2019   Neck pain 09/30/2018   Other allergic rhinitis 05/22/2016   Acute sinusitis 05/22/2016   Moderate persistent asthma with exacerbation 01/07/2016   Moderate persistent asthma without complication 01/07/2016   Constipation due to slow transit 11/02/2014   Dyspnea    Asthma  exacerbation 07/06/2014   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  Jolynda is a 19 y.o. female presenting for a sick visit.  Her son was diagnosed with RSV one week ago or so.  Her symptoms actually admitted to the hospital and was on oxygen for period of time.  He has been home for around 1 week and seems to be getting better slowly.  Unfortunately, around the end of August, she developed symptoms of her own including coughing and shortness of breath.  She did a televisit with September since we did not want to risk seeing her in the office.  We sent in prednisone at that time which she says did not do much to help her.  She did go to see urgent care over the weekend and was given a steroid injection.  A rapid COVID-19 test was negative.  She has not been febrile during this episode.  She does report some sinus fullness.  She has had bilateral nasal drainage.  She reports some ear fullness as well.  She does endorse some postnasal drip.  She denies any chest pain.  She has been using her albuterol 4 puffs every 4-6 hours.  Asthma/Respiratory Symptom History: She remains on the [facility] 200/5 mcg two puffs twice daily in addition to Spiriva. She is also on ProAir.  She was on [facility] before she was pregnant.  She stopped it during her pregnancy and was interested  in restarting it.  She is now working on solidifying her Medicaid coverage.  She has been using her albuterol 4 puffs every 4 hours for the last several days.  She is already talked to Tammy about restarting her Harrington Challenger.  Allergic Rhinitis Symptom History: She remains on her Singulair as well as an over-the-counter antihistamine.  She does not use a nose spray.  She has a history of multiple indoor and outdoor allergens.  She has not needed antibiotics.  Otherwise, there have been no changes to her past medical history,  surgical history, family history, or social history.    Review of Systems  Constitutional: Negative.  Negative for chills, fever, malaise/fatigue and weight loss.  HENT: Positive for congestion and sinus pain. Negative for ear discharge and ear pain.   Eyes: Negative for pain, discharge and redness.  Respiratory: Positive for cough, shortness of breath and wheezing. Negative for sputum production.   Cardiovascular: Negative.  Negative for chest pain and palpitations.  Gastrointestinal: Negative for abdominal pain, constipation, diarrhea, heartburn, nausea and vomiting.  Skin: Negative.  Negative for itching and rash.  Neurological: Negative for dizziness and headaches.  Endo/Heme/Allergies: Positive for environmental allergies. Does not bruise/bleed easily.       Objective:   unknown if currently breastfeeding. There is no height or weight on file to calculate BMI.   Physical Exam:  Physical Exam Constitutional:      Appearance: She is well-developed.     Comments: Able to form sentences, but just barely.  HENT:     Head: Normocephalic and atraumatic.     Right Ear: Tympanic membrane, ear canal and external ear normal.     Left Ear: Tympanic membrane, ear canal and external ear normal.     Nose: No nasal deformity, septal deviation, mucosal edema or rhinorrhea.     Right Turbinates: Enlarged, swollen and pale.     Left Turbinates: Enlarged, swollen and pale.     Right Sinus: Maxillary sinus tenderness and frontal sinus tenderness present.     Left Sinus: Maxillary sinus tenderness present.     Comments: Purulent discharge from the bilateral nares.     Mouth/Throat:     Mouth: Mucous membranes are not pale and not dry.     Pharynx: Uvula midline.  Eyes:     General:        Right eye: No discharge.        Left eye: No discharge.     Conjunctiva/sclera: Conjunctivae normal.     Right eye: Right conjunctiva is not injected. No chemosis.    Left eye: Left conjunctiva is  not injected. No chemosis.    Pupils: Pupils are equal, round, and reactive to light.  Cardiovascular:     Rate and Rhythm: Normal rate and regular rhythm.     Heart sounds: Normal heart sounds.  Pulmonary:     Effort: Tachypnea, accessory muscle usage and respiratory distress present.     Breath sounds: Decreased air movement present. Examination of the right-upper field reveals wheezing. Examination of the left-upper field reveals wheezing. Examination of the right-middle field reveals wheezing. Examination of the left-middle field reveals wheezing. Examination of the right-lower field reveals wheezing. Examination of the left-lower field reveals wheezing. Decreased breath sounds and wheezing present. No rhonchi or rales.  Chest:     Chest wall: No tenderness.  Lymphadenopathy:     Cervical: No cervical adenopathy.  Skin:    General: Skin is warm.     Capillary  Refill: Capillary refill takes less than 2 seconds.     Coloration: Skin is not pale.     Findings: No abrasion, erythema, petechiae or rash. Rash is not papular, urticarial or vesicular.  Neurological:     Mental Status: She is alert.      Diagnostic studies: none   She was given Xopenex 8 puffs twice with marked improvement in her aeration.  Although she continued to have some expiratory wheezes, she was always moving air when she left. She was also started on prednisone.       Malachi Bonds, MD  [facility]

## 2020-04-26 NOTE — Telephone Encounter (Signed)
Error

## 2020-04-27 ENCOUNTER — Telehealth: Payer: Self-pay | Admitting: *Deleted

## 2020-04-27 DIAGNOSIS — J4550 Severe persistent asthma, uncomplicated: Secondary | ICD-10-CM

## 2020-04-27 NOTE — Telephone Encounter (Signed)
I am so sorry I did not catch that. We should have grabbed it Tuesday night. My apologies!   Malachi Bonds, MD [facility]

## 2020-04-27 NOTE — Telephone Encounter (Signed)
L/m for patient that Ins will need recent CBC to approve Fasenra and she can go by clinic or [facility] to get same drawn and put orders in.

## 2020-04-27 NOTE — Telephone Encounter (Signed)
-----   Message from [facility] Larose Hires, MD sent at 04/27/2020 10:54 AM EDT ----- Regarding: RE: restart fasenra Her next appt is with Dellis Anes on 05/26/20.   Since she has been seen twice now since she no-showed I'll have someone put the CBC w diff order in and hopefully she can just come to office for draw before her next visit.    Harlyn Rathmann can you order those and I or Dellis Anes can go ahead and sign it? ----- Message ----- From: Devoria Glassing, CMA Sent: 04/27/2020   9:21 AM EDT To: Alfonse Spruce, MD, # Subject: restart fasenra                                Patient will need CBC w/diff in order to start process. Note was in the appt she no showed.  Last one was over a year ago will need newer one to submit for approval. Desjuan Stearns ----- Message ----- From: Marcelyn Bruins, MD Sent: 04/27/2020   8:29 AM EDT To: Alfonse Spruce, MD, #  No process has not started as she no-showed the appt she had with me a week or two ago to discuss restarting.    So I guess now the process can start.   ----- Message ----- From: Alfonse Spruce, MD Sent: 04/26/2020   6:42 PM EDT To: Devoria Glassing, CMA, #  She is wanting to restart Harrington Challenger. I assume that process has started?

## 2020-05-26 ENCOUNTER — Ambulatory Visit: Payer: Medicaid Other | Admitting: Allergy & Immunology

## 2020-09-13 ENCOUNTER — Other Ambulatory Visit: Payer: Self-pay | Admitting: Allergy

## 2020-09-22 NOTE — Patient Instructions (Incomplete)
Severe persistent asthma Continue Dulera 200 mcg 2 puffs twice a day with spacer to help prevent cough and wheeze Continue Spiriva Respimat 1.25 mcg 2 puffs once a day Continue Singulair 10 mg once a day to help prevent cough and wheeze Please get cbc with diff to try to re-qualify you for Fasenra. You will need to wait weeks to get this drawn since you were recently on prednisone. We will call you with results.  Allergic rhinitis  continue Singulair 10 mg as above Continue Xyzal 5 mg once a day as needed for runny nose/itching  Please let us know if this treatment plan is not working well for you. Schedule a follow up appointment in 

## 2020-09-23 ENCOUNTER — Ambulatory Visit: Payer: Medicaid Other | Admitting: Family

## 2020-09-25 NOTE — Patient Instructions (Incomplete)
Severe persistent asthma Continue Dulera 200 mcg 2 puffs twice a day with spacer to help prevent cough and wheeze Continue Spiriva Respimat 1.25 mcg 2 puffs once a day Continue Singulair 10 mg once a day to help prevent cough and wheeze Please get cbc with diff to try to re-qualify you for Fasenra. You will need to wait weeks to get this drawn since you were recently on prednisone. We will call you with results.  Allergic rhinitis  continue Singulair 10 mg as above Continue Xyzal 5 mg once a day as needed for runny nose/itching  Please let us know if this treatment plan is not working well for you. Schedule a follow up appointment in

## 2020-09-27 ENCOUNTER — Ambulatory Visit: Payer: Medicaid Other | Admitting: Family

## 2020-10-13 ENCOUNTER — Ambulatory Visit: Payer: Medicaid Other | Admitting: Allergy

## 2020-10-14 NOTE — Patient Instructions (Incomplete)
Severe persistent asthma Continue Dulera 200 mcg 2 puffs twice a day with spacer to help prevent cough and wheeze Continue Spiriva Respimat 1.25 mcg 2 puffs once a day Continue Singulair 10 mg once a day to help prevent cough and wheeze Please get cbc with diff to try to re-qualify you for Fasenra. We will call you with results.  Allergic rhinitis ( 2020 lab work: dust mite, cat, dog, grass pollen, tree pollen, mouse urine, molds, and weed pollen)  continue Singulair 10 mg as above Continue Xyzal 5 mg once a day as needed for runny nose/itching  Please let us know if this treatment plan is not working well for you. Schedule a follow up appointment in

## 2020-10-17 ENCOUNTER — Ambulatory Visit: Payer: Medicaid Other | Admitting: Family

## 2020-12-14 ENCOUNTER — Other Ambulatory Visit: Payer: Self-pay | Admitting: Allergy

## 2021-01-10 ENCOUNTER — Telehealth: Payer: Self-pay | Admitting: Allergy & Immunology

## 2021-01-10 NOTE — Telephone Encounter (Signed)
Called and left a message for patient to call our office back to discuss her albuterol refill. Patient has not been seen in office since last year. Patient has had multiple cancellations and no shows since last visit. Please advise on albuterol refill. Thank you.

## 2021-01-12 NOTE — Telephone Encounter (Signed)
Left a message that she needs to return our call to schedule a follow up appointment so that we may refill her albuterol.

## 2021-01-18 MED ORDER — ALBUTEROL SULFATE HFA 108 (90 BASE) MCG/ACT IN AERS: 2.0000 | INHALATION_SPRAY | RESPIRATORY_TRACT | 0 refills | Status: DC | PRN

## 2021-01-18 NOTE — Telephone Encounter (Signed)
Pt returned call and is scheduled for 01/27/2021. Patient was made aware that this would be a courtesy refill & if she is unable to keep appointment they would not be ale to send any more refills. Patient verbalized understanding and did ask for two inhalers to be sent in to pharmacy, one for work and one for home.    [facility] - [address].   Please advise.

## 2021-01-18 NOTE — Telephone Encounter (Signed)
Spoke to patient, informed her that I could send in one courtesy refill however she will need to keep her appointment on 01/27/2021 for further refills. Patient verbalized understanding and asked if it would be ok to bring her son to the appointment due to childcare. I informed the patient that would be fine.

## 2021-01-18 NOTE — Addendum Note (Signed)
Addended by: Dub Mikes on: 01/18/2021 06:12 PM   Modules accepted: Orders

## 2021-01-27 ENCOUNTER — Ambulatory Visit: Payer: Medicaid Other | Admitting: Family Medicine

## 2021-02-08 NOTE — Patient Instructions (Addendum)
Asthma Restart montelukast 10 mg once a day to prevent cough or wheeze Restart Dulera 200-2 puffs twice a day with a spacer to prevent cough or wheeze Restart Spiriva 1.25 mcg 2 puffs once a day to prevent cough or wheeze Continue albuterol 2 puffs once every 4 hours as needed for cough or wheeze You may use albuterol 2 puffs 5 to 15 minutes before activity to decrease cough or wheeze We will submit your information to out biologics coordinator to restart Fasenra  Allergic rhinitis Continue allergen avoidance measures directed toward pollens, pets, dust mite, mold, and cockroach as listed below Continue cetirizine 10 mg once a day as needed for runny nose or itch. Remember to rotate to a different antihistamine about every 3 months. Some examples of over the counter antihistamines include Zyrtec (cetirizine), Xyzal (levocetirizine), Allegra (fexofenadine), and Claritin (loratidine).  Continue Flonase 2 sprays in each nostril once a day as needed for a stuffy nose Consider saline nasal rinses as needed for nasal symptoms. Use this before any medicated nasal sprays for best result  Allergic conjunctivitis Some over the counter eye drops include Pataday one drop in each eye once a day as needed for red, itchy eyes OR Zaditor one drop in each eye twice a day as needed for red itchy eyes.  Atopic dermatitis Continue a daily moisturizing routine For stubborn red, itchy areas below your face, begin triamcinolone 0.1% ointment twice a day as needed.  Do not use this medication for longer than 2 weeks in a row For stubborn red itchy areas on your face, begin desonide 0.05% ointment twice a day as needed.  Do not use this medication for longer than 2 weeks in a row  Call the clinic if this treatment plan is not working well for you  Follow up in 1 month or sooner if needed.  Reducing Pollen Exposure The American Academy of Allergy, Asthma and Immunology suggests the following steps to reduce your  exposure to pollen during allergy seasons. Do not hang sheets or clothing out to dry; pollen may collect on these items. Do not mow lawns or spend time around freshly cut grass; mowing stirs up pollen. Keep windows closed at night.  Keep car windows closed while driving. Minimize morning activities outdoors, a time when pollen counts are usually at their highest. Stay indoors as much as possible when pollen counts or humidity is high and on windy days when pollen tends to remain in the air longer. Use air conditioning when possible.  Many air conditioners have filters that trap the pollen spores. Use a HEPA room air filter to remove pollen form the indoor air you breathe.  Control of Dog or Cat Allergen Avoidance is the best way to manage a dog or cat allergy. If you have a dog or cat and are allergic to dog or cats, consider removing the dog or cat from the home. If you have a dog or cat but don't want to find it a new home, or if your family wants a pet even though someone in the household is allergic, here are some strategies that may help keep symptoms at bay:  Keep the pet out of your bedroom and restrict it to only a few rooms. Be advised that keeping the dog or cat in only one room will not limit the allergens to that room. Don't pet, hug or kiss the dog or cat; if you do, wash your hands with soap and water. High-efficiency particulate air (HEPA) cleaners   and water. High-efficiency particulate air (HEPA) cleaners run continuously in a bedroom or living room can reduce allergen levels over time. Regular use of a high-efficiency vacuum cleaner or a central vacuum can reduce allergen levels. Giving your dog or cat a bath at least once a week can reduce airborne allergen.   Control of Dust Mite Allergen Dust mites play a major role in allergic asthma and rhinitis. They occur in environments with high humidity wherever human skin is found. Dust mites absorb humidity from the atmosphere (ie,  they do not drink) and feed on organic matter (including shed human and animal skin). Dust mites are a microscopic type of insect that you cannot see with the naked eye. High levels of dust mites have been detected from mattresses, pillows, carpets, upholstered furniture, bed covers, clothes, soft toys and any woven material. The principal allergen of the dust mite is found in its feces. A gram of dust may contain 1,000 mites and 250,000 fecal particles. Mite antigen is easily measured in the air during house cleaning activities. Dust mites do not bite and do not cause harm to humans, other than by triggering allergies/asthma.  Ways to decrease your exposure to dust mites in your home:  1. Encase mattresses, box springs and pillows with a mite-impermeable barrier or cover  2. Wash sheets, blankets and drapes weekly in hot water (130 F) with detergent and dry them in a dryer on the hot setting.  3. Have the room cleaned frequently with a vacuum cleaner and a damp dust-mop. For carpeting or rugs, vacuuming with a vacuum cleaner equipped with a high-efficiency particulate air (HEPA) filter. The dust mite allergic individual should not be in a room which is being cleaned and should wait 1 hour after cleaning before going into the room.  4. Do not sleep on upholstered furniture (eg, couches).  5. If possible removing carpeting, upholstered furniture and drapery from the home is ideal. Horizontal blinds should be eliminated in the rooms where the person spends the most time (bedroom, study, television room). Washable vinyl, roller-type shades are optimal.  6. Remove all non-washable stuffed toys from the bedroom. Wash stuffed toys weekly like sheets and blankets above.  7. Reduce indoor humidity to less than 50%. Inexpensive humidity monitors can be purchased at most hardware stores. Do not use a humidifier as can make the problem worse and are not recommended.  Control of Mold Allergen Mold and fungi  can grow on a variety of surfaces provided certain temperature and moisture conditions exist.  Outdoor molds grow on plants, decaying vegetation and soil.  The major outdoor mold, Alternaria and Cladosporium, are found in very high numbers during hot and dry conditions.  Generally, a late Summer - Fall peak is seen for common outdoor fungal spores.  Rain will temporarily lower outdoor mold spore count, but counts rise rapidly when the rainy period ends.  The most important indoor molds are Aspergillus and Penicillium.  Dark, humid and poorly ventilated basements are ideal sites for mold growth.  The next most common sites of mold growth are the bathroom and the kitchen.  Outdoor [employer] Use air conditioning and keep windows closed Avoid exposure to decaying vegetation. Avoid leaf raking. Avoid grain handling. Consider wearing a face mask if working in moldy areas.  Indoor Mold Control Maintain humidity below 50%. Clean washable surfaces with 5% bleach solution. Remove sources e.g. Contaminated carpets.  Control of Mold Allergen Mold and fungi can grow on a variety of  exist.  Outdoor molds grow on plants, decaying vegetation and soil.  The major outdoor mold, Alternaria and Cladosporium, are found in very high numbers during hot and dry conditions.  Generally, a late Summer - Fall peak is seen for common outdoor fungal spores.  Rain will temporarily lower outdoor mold spore count, but counts rise rapidly when the rainy period ends.  The most important indoor molds are Aspergillus and Penicillium.  Dark, humid and poorly ventilated basements are ideal sites for mold growth.  The next most common sites of mold growth are the bathroom and the kitchen.  Outdoor Mold Control Use air conditioning and keep windows closed Avoid exposure to decaying vegetation. Avoid leaf raking. Avoid grain handling. Consider wearing a face mask if working in moldy areas.  Indoor Mold Control Maintain  humidity below 50%. Clean washable surfaces with 5% bleach solution. Remove sources e.g. Contaminated carpets. 

## 2021-02-08 NOTE — Progress Notes (Signed)
[address] Ariel Mooney [place] [address] Dept: [phone]  FOLLOW UP NOTE  Patient ID: Ariel Mooney, female    DOB: 2001-05-02  Age: 20 y.o. MRN: [id number] Date of Office Visit: 02/09/2021  Assessment  Chief Complaint: Asthma (ACT - 12 Shortness of breath, tightness in her chest wakes her up at night. She would like to go back on fasenra injections. ) and Eczema (Her asthma is possibly a trigger for her eczema. On her neck, and arms)  HPI Ariel Mooney is a 20 year old female who presents the clinic for follow-up visit.  She was last seen in this clinic on 04/26/2020 by Dr. Dellis Anes for evaluation of asthma, allergic rhinitis, and acute sinusitis requiring Augmentin and prednisone for resolution of symptoms.  At today's visit, she reports her asthma has been poorly controlled with symptoms that "vary from day-to-day" including shortness of breath and chest tightness with activity and rest, wheezing occurring mostly at night, and occasional dry cough.  She reports the symptoms have been occurring for the last year.  She continues Dulera 200-2 puffs twice a day and albuterol daily.  She reports she has been out of of montelukast and Spiriva 1.25 mcg for several months.  She is interested in restarting all of her medications as well as Fasenra injections.  Her last Fasenra injection was 11/04/2019.  She reports while she was previously receiving Fasenra injections, her asthma symptoms had been well controlled.  She denies symptoms of reflux including heartburn and vomiting and is not currently taking a medication to control reflux.  Allergic rhinitis is reported as poorly controlled with symptoms including clear rhinorrhea, nasal congestion, and sneeze which is aggravated by dogs or being outside for extended periods of time.  She continues an antihistamine and Flonase daily with moderate relief of symptoms.  Atopic dermatitis is reported as poorly controlled with red and itchy areas  occurring on her neck and arms.  She is currently using a daily moisturizing routine as well as hydrocortisone 1% with no relief of symptoms.  Her current medications are listed in the chart.  Patient reports she is not pregnant at this time nor is she breast-feeding at this time.  Drug Allergies:  Allergies  Allergen Reactions   Lac Bovis Hives   Lactose Intolerance (Gi) Hives and Shortness Of Breath   Latex Shortness Of Breath and Rash    Physical Exam: BP 118/74   Pulse 80   Temp 97.9 F (36.6 C)   Resp 18   Ht 5\' 1"  (1.549 m)   Wt 237 lb 9.6 oz (107.8 kg)   SpO2 97%   BMI 44.89 kg/m    Physical Exam Vitals reviewed.  Constitutional:      Appearance: Normal appearance.  HENT:     Head: Normocephalic and atraumatic.     Right Ear: Tympanic membrane normal.     Left Ear: Tympanic membrane normal.     Nose:     Comments: Bilateral nares edematous and pale with clear nasal drainage noted.  Pharynx normal.  Ears normal.  Eyes normal.    Mouth/Throat:     Pharynx: Oropharynx is clear.  Eyes:     Conjunctiva/sclera: Conjunctivae normal.  Cardiovascular:     Rate and Rhythm: Normal rate and regular rhythm.     Heart sounds: Normal heart sounds. No murmur heard. Pulmonary:     Effort: Pulmonary effort is normal.     Breath sounds: Normal breath sounds.     Comments: Lungs  clear to auscultation Musculoskeletal:        General: Normal range of motion.     Cervical back: Normal range of motion and neck supple.  Skin:    General: Skin is warm.     Comments: Small eczematous patches scattered over bilateral forearms and one eczematous patch on the neck.  No open areas or drainage noted.  Neurological:     Mental Status: She is alert and oriented to person, place, and time.  Psychiatric:        Mood and Affect: Mood normal.        Behavior: Behavior normal.        Thought Content: Thought content normal.        Judgment: Judgment normal.    Diagnostics: FVC 3.48, FEV1  3.06.  Predicted FVC 3.50, predicted FEV1 3.08.  Spirometry indicates normal ventilatory function.  Assessment and Plan: 1. Not well controlled severe persistent asthma   2. Seasonal and perennial allergic rhinitis   3. Intrinsic atopic dermatitis   4. Seasonal allergic conjunctivitis     Meds ordered this encounter  Medications   albuterol (PROAIR HFA) 108 (90 Base) MCG/ACT inhaler    Sig: Inhale 2 puffs once every 4 hours as needed for cough or wheeze.    Dispense:  18 g    Refill:  1    Patient will need to keep her OV on 01/27/2021 for further refills.   mometasone-formoterol (DULERA) 200-5 MCG/ACT AERO    Sig: Inhale 2 puffs into the lungs 2 (two) times daily.    Dispense:  13 g    Refill:  5   montelukast (SINGULAIR) 10 MG tablet    Sig: Take 1 tablet (10 mg total) by mouth at bedtime.    Dispense:  30 tablet    Refill:  5   Tiotropium Bromide Monohydrate (SPIRIVA RESPIMAT) 1.25 MCG/ACT AERS    Sig: Inhale 2 puffs once a day to prevent cough or wheeze.    Dispense:  4 g    Refill:  5   triamcinolone ointment (KENALOG) 0.1 %    Sig: Apply 1 application topically 2 (two) times daily.    Dispense:  80 g    Refill:  5   desonide (DESOWEN) 0.05 % ointment    Sig: Apply 1 application topically 2 (two) times daily as needed.    Dispense:  60 g    Refill:  5     Patient Instructions  Asthma Restart montelukast 10 mg once a day to prevent cough or wheeze Continue Dulera 200-2 puffs twice a day with a spacer to prevent cough or wheeze Restart Spiriva 1.25 mcg 2 puffs once a day to prevent cough or wheeze Continue albuterol 2 puffs once every 4 hours as needed for cough or wheeze You may use albuterol 2 puffs 5 to 15 minutes before activity to decrease cough or wheeze We will collect the appropriate lab and then submit your information to out biologics coordinator to restart Fasenra  Allergic rhinitis Continue allergen avoidance measures directed toward pollens, pets, dust  mite, mold, and cockroach as listed below Continue an over-the-counter antihistamine once a day as needed for runny nose or itch. Remember to rotate to a different antihistamine about every 3 months. Some examples of over the counter antihistamines include Zyrtec (cetirizine), Xyzal (levocetirizine), Allegra (fexofenadine), and Claritin (loratidine).  Continue Flonase 2 sprays in each nostril once a day as needed for a stuffy nose Consider saline nasal rinses as  needed for nasal symptoms. Use this before any medicated nasal sprays for best result  Allergic conjunctivitis Some over the counter eye drops include Pataday one drop in each eye once a day as needed for red, itchy eyes OR Zaditor one drop in each eye twice a day as needed for red itchy eyes.  Atopic dermatitis Continue a daily moisturizing routine For stubborn red, itchy areas below your face, begin triamcinolone 0.1% ointment twice a day as needed.  Do not use this medication for longer than 2 weeks in a row For stubborn red itchy areas on your face, begin desonide 0.05% ointment twice a day as needed.  Do not use this medication for longer than 2 weeks in a row  Call the clinic if this treatment plan is not working well for you  Follow up in 2 months or sooner if needed.   Return in about 2 months (around 04/11/2021).    Thank you for the opportunity to care for this patient.  Please do not hesitate to contact me with questions.  Ariel Leyland, FNP [facility]

## 2021-02-09 ENCOUNTER — Encounter: Payer: Self-pay | Admitting: Family Medicine

## 2021-02-09 ENCOUNTER — Ambulatory Visit (INDEPENDENT_AMBULATORY_CARE_PROVIDER_SITE_OTHER): Payer: Medicaid Other | Admitting: Family Medicine

## 2021-02-09 ENCOUNTER — Other Ambulatory Visit: Payer: Self-pay

## 2021-02-09 ENCOUNTER — Telehealth: Payer: Self-pay

## 2021-02-09 VITALS — BP 118/74 | HR 80 | Temp 97.9°F | Resp 18 | Ht 61.0 in | Wt 237.6 lb

## 2021-02-09 DIAGNOSIS — J302 Other seasonal allergic rhinitis: Secondary | ICD-10-CM

## 2021-02-09 DIAGNOSIS — J4550 Severe persistent asthma, uncomplicated: Secondary | ICD-10-CM | POA: Insufficient documentation

## 2021-02-09 DIAGNOSIS — L2084 Intrinsic (allergic) eczema: Secondary | ICD-10-CM

## 2021-02-09 DIAGNOSIS — H1013 Acute atopic conjunctivitis, bilateral: Secondary | ICD-10-CM | POA: Diagnosis not present

## 2021-02-09 DIAGNOSIS — J3089 Other allergic rhinitis: Secondary | ICD-10-CM | POA: Diagnosis not present

## 2021-02-09 DIAGNOSIS — H1010 Acute atopic conjunctivitis, unspecified eye: Secondary | ICD-10-CM

## 2021-02-09 HISTORY — DX: Intrinsic (allergic) eczema: L20.84

## 2021-02-09 MED ORDER — DESONIDE 0.05 % EX OINT: 1.0000 "application " | TOPICAL_OINTMENT | Freq: Two times a day (BID) | CUTANEOUS | 5 refills | Status: DC | PRN

## 2021-02-09 MED ORDER — SPIRIVA RESPIMAT 1.25 MCG/ACT IN AERS: INHALATION_SPRAY | RESPIRATORY_TRACT | 5 refills | Status: DC

## 2021-02-09 MED ORDER — TRIAMCINOLONE ACETONIDE 0.1 % EX OINT: 1.0000 "application " | TOPICAL_OINTMENT | Freq: Two times a day (BID) | CUTANEOUS | 5 refills | Status: DC

## 2021-02-09 MED ORDER — ALBUTEROL SULFATE HFA 108 (90 BASE) MCG/ACT IN AERS: INHALATION_SPRAY | RESPIRATORY_TRACT | 1 refills | Status: DC

## 2021-02-09 MED ORDER — MONTELUKAST SODIUM 10 MG PO TABS: 10.0000 mg | ORAL_TABLET | Freq: Every day | ORAL | 5 refills | Status: DC

## 2021-02-09 MED ORDER — DULERA 200-5 MCG/ACT IN AERO: 2.0000 | INHALATION_SPRAY | Freq: Two times a day (BID) | RESPIRATORY_TRACT | 5 refills | Status: DC

## 2021-02-09 NOTE — Telephone Encounter (Signed)
I called the patient and was not able to leave a message. I called her mother who was also listed as a contact. She will inform her to call the office to let us know when she can come back for labs.  So that we can get her started back on the fasenra injections. The patient's mother verbalized understanding and will have the patient call the office back.

## 2021-02-10 ENCOUNTER — Telehealth: Payer: Self-pay | Admitting: *Deleted

## 2021-02-10 NOTE — Telephone Encounter (Signed)
PA was submitted through CoverMyMeds for Desonide and is currently pending approval/denial.

## 2021-02-10 NOTE — Telephone Encounter (Signed)
Pa for desonide has been approved

## 2021-02-18 ENCOUNTER — Other Ambulatory Visit: Payer: Self-pay | Admitting: Allergy & Immunology

## 2021-03-25 ENCOUNTER — Other Ambulatory Visit: Payer: Self-pay | Admitting: Family Medicine

## 2021-04-20 ENCOUNTER — Ambulatory Visit: Payer: Medicaid Other | Admitting: Family Medicine

## 2021-06-28 ENCOUNTER — Other Ambulatory Visit: Payer: Self-pay | Admitting: Family Medicine

## 2021-06-29 ENCOUNTER — Telehealth: Payer: Self-pay | Admitting: Family Medicine

## 2021-06-29 NOTE — Telephone Encounter (Signed)
PA has been submitted through CoverMyMeds for ProAir and is currently pending approval/denial.

## 2021-06-29 NOTE — Telephone Encounter (Signed)
Ariel Mooney called in and states she needs Pro-Air.  Ariel Mooney stated that her insurance prefers Ventolin and that is what the pharmacy is trying to fill.  Patient states she has tried Ventolin and it causes her to become more out of breath.  Patient states pharmacy told her that she would need to call here and request an authorization to be filled out to show that she has tried Ventolin and can't tolerate it-so her insurance will cover Pro-Air.  Please advise.

## 2021-07-07 ENCOUNTER — Other Ambulatory Visit: Payer: Self-pay | Admitting: Family Medicine

## 2021-07-11 ENCOUNTER — Other Ambulatory Visit: Payer: Self-pay | Admitting: *Deleted

## 2021-07-11 MED ORDER — PROAIR DIGIHALER 108 (90 BASE) MCG/ACT IN AEPB: 2.0000 | INHALATION_SPRAY | RESPIRATORY_TRACT | 1 refills | Status: DC | PRN

## 2021-07-11 NOTE — Telephone Encounter (Signed)
Resubmitted PA for ProAir Digihaler to see what the determination will be.

## 2021-07-11 NOTE — Telephone Encounter (Signed)
PA has been approved for [employer] and sent to pharmacy electronically. PA is good for 1 year. Called patient and informed. Patient verbalized understanding. I did advise to call back if she is having further issues.   She did stated that she came to see Thurston Hole 02/09/2021 for insurance purposes to get her [employer] Ch[employer]enger re-approved and she has not heard back from anyone. Is this something you can look into Tammy?

## 2021-07-19 NOTE — Telephone Encounter (Signed)
L/m for patient to advise reason no response to restart [employer] Challenger is when she was seen needed CBC w/diff done in order to get Ins reapproval and same was never done

## 2021-07-19 NOTE — Telephone Encounter (Signed)
Spoke to patient and advised need for lab test in order to get Ins approval for [place] restart. Patient is going to contact [place] office and go by there for lab draw

## 2021-07-20 NOTE — Telephone Encounter (Signed)
Thank you :)

## 2021-08-20 NOTE — L&D Delivery Note (Signed)
Patient was C/C/0 and moved to OR for second stage.  Pt pushed for about 30 minutes with epidural.    NSVD  A infant, female, Apgars 8,9, weight P.    Baby B was confirmed vtx and was allowed to push. Pt pushed for another 30 minutes, AROM second bag was clear.  NSVD B infant, female, Apgars 8,8 and weight P  The patient had no lacerations but did have the foley bulb come out intact during first baby delivery with some bloody d/c.  Fundus was firm. EBL was expected amount. Placenta A was delivered intact but B was manually removed.  Vagina was clear.  Delayed cord clamping done for 30-60 seconds while warming baby. Baby was vigorous and doing skin to skin with mother.  Ariel Mooney

## 2021-09-01 ENCOUNTER — Other Ambulatory Visit: Payer: Self-pay

## 2021-09-01 LAB — OB RESULTS CONSOLE RPR: RPR: NONREACTIVE

## 2021-09-01 LAB — OB RESULTS CONSOLE HIV ANTIBODY (ROUTINE TESTING): HIV: NONREACTIVE

## 2021-09-01 LAB — OB RESULTS CONSOLE HEPATITIS B SURFACE ANTIGEN: Hepatitis B Surface Ag: NEGATIVE

## 2021-09-01 LAB — OB RESULTS CONSOLE RUBELLA ANTIBODY, IGM: Rubella: IMMUNE

## 2021-09-01 LAB — HEPATITIS C ANTIBODY: HCV Ab: NEGATIVE

## 2021-09-05 ENCOUNTER — Other Ambulatory Visit: Payer: Self-pay | Admitting: Allergy & Immunology

## 2021-09-11 ENCOUNTER — Other Ambulatory Visit: Payer: Self-pay

## 2021-09-25 ENCOUNTER — Other Ambulatory Visit: Payer: Self-pay | Admitting: Family Medicine

## 2021-09-29 ENCOUNTER — Other Ambulatory Visit: Payer: Self-pay | Admitting: Obstetrics and Gynecology

## 2021-09-29 DIAGNOSIS — O99212 Obesity complicating pregnancy, second trimester: Secondary | ICD-10-CM

## 2021-09-29 DIAGNOSIS — Z363 Encounter for antenatal screening for malformations: Secondary | ICD-10-CM

## 2021-09-29 DIAGNOSIS — O30042 Twin pregnancy, dichorionic/diamniotic, second trimester: Secondary | ICD-10-CM

## 2021-09-29 DIAGNOSIS — Z3A19 19 weeks gestation of pregnancy: Secondary | ICD-10-CM

## 2021-10-22 ENCOUNTER — Inpatient Hospital Stay (HOSPITAL_COMMUNITY)
Admission: AD | Admit: 2021-10-22 | Discharge: 2021-10-22 | Disposition: A | Discharge status: Home / Self Care | Payer: Medicaid Other | Attending: Obstetrics | Admitting: Obstetrics

## 2021-10-22 ENCOUNTER — Encounter (HOSPITAL_COMMUNITY): Payer: Self-pay

## 2021-10-22 ENCOUNTER — Inpatient Hospital Stay (HOSPITAL_COMMUNITY): Payer: Medicaid Other

## 2021-10-22 DIAGNOSIS — O30002 Twin pregnancy, unspecified number of placenta and unspecified number of amniotic sacs, second trimester: Secondary | ICD-10-CM | POA: Insufficient documentation

## 2021-10-22 DIAGNOSIS — K819 Cholecystitis, unspecified: Secondary | ICD-10-CM

## 2021-10-22 DIAGNOSIS — O26892 Other specified pregnancy related conditions, second trimester: Secondary | ICD-10-CM | POA: Diagnosis not present

## 2021-10-22 DIAGNOSIS — K8020 Calculus of gallbladder without cholecystitis without obstruction: Secondary | ICD-10-CM | POA: Insufficient documentation

## 2021-10-22 DIAGNOSIS — Z3A17 17 weeks gestation of pregnancy: Secondary | ICD-10-CM | POA: Diagnosis not present

## 2021-10-22 DIAGNOSIS — O26612 Liver and biliary tract disorders in pregnancy, second trimester: Secondary | ICD-10-CM | POA: Insufficient documentation

## 2021-10-22 DIAGNOSIS — R1011 Right upper quadrant pain: Secondary | ICD-10-CM | POA: Diagnosis not present

## 2021-10-22 LAB — URINALYSIS, ROUTINE W REFLEX MICROSCOPIC
Bilirubin Urine: NEGATIVE
Glucose, UA: NEGATIVE mg/dL
Hgb urine dipstick: NEGATIVE
Ketones, ur: NEGATIVE mg/dL
Nitrite: NEGATIVE
Protein, ur: NEGATIVE mg/dL
Specific Gravity, Urine: 1.014 (ref 1.005–1.030)
pH: 6 (ref 5.0–8.0)

## 2021-10-22 LAB — COMPREHENSIVE METABOLIC PANEL
ALT: 22 U/L (ref 0–44)
AST: 20 U/L (ref 15–41)
Albumin: 2.9 g/dL — ABNORMAL LOW (ref 3.5–5.0)
Alkaline Phosphatase: 64 U/L (ref 38–126)
Anion gap: 8 (ref 5–15)
BUN: 9 mg/dL (ref 6–20)
CO2: 23 mmol/L (ref 22–32)
Calcium: 8.9 mg/dL (ref 8.9–10.3)
Chloride: 104 mmol/L (ref 98–111)
Creatinine, Ser: 0.55 mg/dL (ref 0.44–1.00)
GFR, Estimated: >60 mL/min (ref >60)
Glucose, Bld: 105 mg/dL — ABNORMAL HIGH (ref 70–99)
Potassium: 3.7 mmol/L (ref 3.5–5.1)
Sodium: 135 mmol/L (ref 135–145)
Total Bilirubin: 0.3 mg/dL (ref 0.3–1.2)
Total Protein: 6.2 g/dL — ABNORMAL LOW (ref 6.5–8.1)

## 2021-10-22 LAB — CBC WITH DIFFERENTIAL/PLATELET
Abs Immature Granulocytes: 0.1 10*3/uL — ABNORMAL HIGH (ref 0.00–0.07)
Basophils Absolute: 0.1 10*3/uL (ref 0.0–0.1)
Basophils Relative: 0 %
Eosinophils Absolute: 0.3 10*3/uL (ref 0.0–0.5)
Eosinophils Relative: 2 %
HCT: 39.5 % (ref 36.0–46.0)
Hemoglobin: 12.4 g/dL (ref 12.0–15.0)
Immature Granulocytes: 1 %
Lymphocytes Relative: 15 %
Lymphs Abs: 2.4 10*3/uL (ref 0.7–4.0)
MCH: 29.1 pg (ref 26.0–34.0)
MCHC: 31.4 g/dL (ref 30.0–36.0)
MCV: 92.7 fL (ref 80.0–100.0)
Monocytes Absolute: 1.1 10*3/uL — ABNORMAL HIGH (ref 0.1–1.0)
Monocytes Relative: 7 %
Neutro Abs: 12 10*3/uL — ABNORMAL HIGH (ref 1.7–7.7)
Neutrophils Relative %: 75 %
Platelets: 260 10*3/uL (ref 150–400)
RBC: 4.26 MIL/uL (ref 3.87–5.11)
RDW: 15.2 % (ref 11.5–15.5)
WBC: 16 10*3/uL — ABNORMAL HIGH (ref 4.0–10.5)
nRBC: 0 % (ref 0.0–0.2)

## 2021-10-22 LAB — AMYLASE: Amylase: 40 U/L (ref 28–100)

## 2021-10-22 LAB — LIPASE, BLOOD: Lipase: 25 U/L (ref 11–51)

## 2021-10-22 MED ORDER — METOCLOPRAMIDE HCL 5 MG/ML IJ SOLN
10.0000 mg | Freq: Once | INTRAMUSCULAR | Status: DC
  Filled 2021-10-22: qty 2

## 2021-10-22 MED ORDER — PROMETHAZINE HCL 12.5 MG PO TABS: 12.5000 mg | ORAL_TABLET | Freq: Four times a day (QID) | ORAL | 0 refills | Status: DC | PRN

## 2021-10-22 MED ORDER — MORPHINE SULFATE (PF) 4 MG/ML IV SOLN
4.0000 mg | Freq: Once | INTRAVENOUS | Status: DC
  Filled 2021-10-22: qty 1

## 2021-10-22 MED ORDER — LACTATED RINGERS IV BOLUS
1000.0000 mL | Freq: Once | INTRAVENOUS | Status: DC
Start: 2021-10-22 — End: 2021-10-22

## 2021-10-22 NOTE — MAU Note (Signed)
..  Ariel Mooney is a 21 y.o. at Unknown here in MAU reporting: Mid and right ABD pain that radiates to the back, started suddenly around 0130. Pt reports that it is sharp and intermittently. Pt stated when pain started she said she felt nauseous and diaphoretic. Pt ireports being pregnant with twins. Pt denies VB, DFM, LOF, N/V, and abnormal discharge.  ?[facility] 03/30/2022 ?Onset of complaint: 0130 ?Pain score: 10/10 ?Vitals:  ? 10/22/21 0238 10/22/21 0239  ?BP:  136/67  ?Pulse: 85   ?Resp: (!) 22   ?Temp:  97.7 ?F (36.5 ?C)  ?SpO2: 97%   ?   ?FHT:152, 156 ? ?Lab orders placed from triage:   ? ?

## 2021-10-22 NOTE — MAU Provider Note (Signed)
History     CSN: [id number]  Arrival date and time: 10/22/21 0155   Event Date/Time   First Provider Initiated Contact with Patient 10/22/21 [phone]      Chief Complaint  Patient presents with   Abdominal Pain   Ariel Mooney is a 21 y.o. G 2 P 1001 at [redacted]w[redacted]d who receives care at [facility].  She presents  today for Abdominal Pain.  She states she started having constant pain under her breast and 0130.  She states the pain "calms down for a few minutes and then comes back stronger."  She states prior to provider arrival in room she had an incident of vomiting.  She states the nausea was brought about by the pain, which she rates a 10/10.  She denies relieving or aggravating factors for the pain.  She endorses fetal movement.  24-hour recall 1900: Flatbread pizza with pepperoni and cheese 1100: Eggs and [place] toast Various snacks throughout the day including chips and "nutrition bars."   OB History     Gravida  2   Para  1   Term  1   Preterm      AB      Living  1      SAB      IAB      Ectopic      Multiple  0   Live Births  1           Past Medical History:  Diagnosis Date   Abdominal pain, recurrent    Asthma    since high school not good control   Blood transfusion without reported diagnosis    Intrinsic atopic dermatitis 02/09/2021    Past Surgical History:  Procedure Laterality Date   TONSILLECTOMY     TONSILLECTOMY  2010    Family History  Problem Relation Age of Onset   Asthma Sister    Asthma Brother    Cholelithiasis Sister    Asthma Sister    Asthma Sister    Asthma Brother    Asthma Brother    Asthma Mother    Diabetes Mother    Hypothyroidism Mother    Ulcers Maternal Grandmother    Cholelithiasis Maternal Grandmother    Asthma Maternal Grandmother    Allergic rhinitis Neg Hx    Angioedema Neg Hx    Eczema Neg Hx    Immunodeficiency Neg Hx    Urticaria Neg Hx     Social History   Tobacco Use   Smoking  status: Never   Smokeless tobacco: Never  Vaping Use   Vaping Use: Never used  Substance Use Topics   Alcohol use: No   Drug use: No    Allergies:  Allergies  Allergen Reactions   Lac Bovis Hives   Lactose Intolerance (Gi) Hives and Shortness Of Breath   Latex Shortness Of Breath and Rash    Medications Prior to Admission  Medication Sig Dispense Refill Last Dose   VENTOLIN HFA 108 (90 Base) MCG/ACT inhaler INHALE 2 PUFFS INTO THE LUNGS EVERY 4 HOURS AS NEEDED FOR WHEEZING OR SHORTNESS OF BREATH 18 g 0 10/21/2021   desonide (DESOWEN) 0.05 % ointment Apply 1 application topically 2 (two) times daily as needed. 60 g 5    ibuprofen (ADVIL) 600 MG tablet Take 1 tablet (600 mg total) by mouth every 6 (six) hours as needed. 90 tablet 0    mometasone-formoterol (DULERA) 200-5 MCG/ACT AERO Inhale 2 puffs into the lungs 2 (  two) times daily. 13 g 5    montelukast (SINGULAIR) 10 MG tablet Take 1 tablet (10 mg total) by mouth at bedtime. 30 tablet 5    PROAIR DIGIHALER 108 (90 Base) MCG/ACT AEPB Inhale 2 puffs into the lungs every 4 (four) hours as needed. 1 each 1 Unknown   Tiotropium Bromide Monohydrate (SPIRIVA RESPIMAT) 1.25 MCG/ACT AERS Inhale 2 puffs once a day to prevent cough or wheeze. 4 g 5 Unknown   triamcinolone ointment (KENALOG) 0.1 % Apply 1 application topically 2 (two) times daily. 80 g 5     Review of Systems  Constitutional:  Negative for chills and fever.  Gastrointestinal:  Positive for abdominal pain, nausea and vomiting. Negative for constipation and diarrhea.  Genitourinary:  Negative for difficulty urinating, dysuria, vaginal bleeding and vaginal discharge.  Musculoskeletal:  Negative for back pain.  Neurological:  Negative for dizziness, light-headedness and headaches.  Physical Exam   Blood pressure 136/67, pulse 85, temperature 97.7 F (36.5 C), resp. rate (!) 22, height 5\' 1"  (1.549 m), weight 106.4 kg, SpO2 97 %, unknown if currently breastfeeding.  Physical  Exam Vitals reviewed.  Constitutional:      General: She is in acute distress.     Appearance: She is well-developed. She is obese.  HENT:     Head: Normocephalic and atraumatic.  Eyes:     Conjunctiva/sclera: Conjunctivae normal.  Cardiovascular:     Rate and Rhythm: Normal rate and regular rhythm.     Heart sounds: Normal heart sounds.  Pulmonary:     Effort: Pulmonary effort is normal. No respiratory distress.     Breath sounds: Normal breath sounds.  Abdominal:     General: Bowel sounds are normal.     Palpations: Abdomen is soft.     Tenderness: There is abdominal tenderness in the epigastric area.  Musculoskeletal:        General: Normal range of motion.  Skin:    General: Skin is warm and dry.  Neurological:     Mental Status: She is alert and oriented to person, place, and time.  Psychiatric:        Mood and Affect: Mood normal.        Behavior: Behavior normal.        Thought Content: Thought content normal.    MAU Course  Procedures Results for orders placed or performed during the hospital encounter of 10/22/21 (from the past 24 hour(s))  Urinalysis, Routine w reflex microscopic     Status: Abnormal   Collection Time: 10/22/21  2:53 AM  Result Value Ref Range   Color, Urine YELLOW YELLOW   APPearance HAZY (A) CLEAR   Specific Gravity, Urine 1.014 1.005 - 1.030   pH 6.0 5.0 - 8.0   Glucose, UA NEGATIVE NEGATIVE mg/dL   Hgb urine dipstick NEGATIVE NEGATIVE   Bilirubin Urine NEGATIVE NEGATIVE   Ketones, ur NEGATIVE NEGATIVE mg/dL   Protein, ur NEGATIVE NEGATIVE mg/dL   Nitrite NEGATIVE NEGATIVE   Leukocytes,Ua TRACE (A) NEGATIVE   RBC / HPF 0-5 0 - 5 RBC/hpf   WBC, UA 0-5 0 - 5 WBC/hpf   Bacteria, UA RARE (A) NONE SEEN   Squamous Epithelial / LPF 6-10 0 - 5   Mucus PRESENT   CBC with Differential/Platelet     Status: Abnormal   Collection Time: 10/22/21  3:22 AM  Result Value Ref Range   WBC 16.0 (H) 4.0 - 10.5 K/uL   RBC 4.26 3.87 - 5.11 MIL/uL  Hemoglobin 12.4 12.0 - 15.0 g/dL   HCT 40.939.5 81.136.0 - 91.446.0 %   MCV 92.7 80.0 - 100.0 fL   MCH 29.1 26.0 - 34.0 pg   MCHC 31.4 30.0 - 36.0 g/dL   RDW 78.215.2 95.611.5 - 21.315.5 %   Platelets 260 150 - 400 K/uL   nRBC 0.0 0.0 - 0.2 %   Neutrophils Relative % 75 %   Neutro Abs 12.0 (H) 1.7 - 7.7 K/uL   Lymphocytes Relative 15 %   Lymphs Abs 2.4 0.7 - 4.0 K/uL   Monocytes Relative 7 %   Monocytes Absolute 1.1 (H) 0.1 - 1.0 K/uL   Eosinophils Relative 2 %   Eosinophils Absolute 0.3 0.0 - 0.5 K/uL   Basophils Relative 0 %   Basophils Absolute 0.1 0.0 - 0.1 K/uL   Immature Granulocytes 1 %   Abs Immature Granulocytes 0.10 (H) 0.00 - 0.07 K/uL  Comprehensive metabolic panel     Status: Abnormal   Collection Time: 10/22/21  3:22 AM  Result Value Ref Range   Sodium 135 135 - 145 mmol/L   Potassium 3.7 3.5 - 5.1 mmol/L   Chloride 104 98 - 111 mmol/L   CO2 23 22 - 32 mmol/L   Glucose, Bld 105 (H) 70 - 99 mg/dL   BUN 9 6 - 20 mg/dL   Creatinine, Ser 0.860.55 0.44 - 1.00 mg/dL   Calcium 8.9 8.9 - 57.810.3 mg/dL   Total Protein 6.2 (L) 6.5 - 8.1 g/dL   Albumin 2.9 (L) 3.5 - 5.0 g/dL   AST 20 15 - 41 U/L   ALT 22 0 - 44 U/L   Alkaline Phosphatase 64 38 - 126 U/L   Total Bilirubin 0.3 0.3 - 1.2 mg/dL   GFR, Estimated >46>60 >96>60 mL/min   Anion gap 8 5 - 15  Amylase     Status: None   Collection Time: 10/22/21  3:22 AM  Result Value Ref Range   Amylase 40 28 - 100 U/L  Lipase, blood     Status: None   Collection Time: 10/22/21  3:22 AM  Result Value Ref Range   Lipase 25 11 - 51 U/L   US ABDOMEN LIMITED RUQ (LIVER/GB)  Result Date: 10/22/2021 CLINICAL DATA:  Right upper quadrant pain. EXAM: ULTRASOUND ABDOMEN LIMITED RIGHT UPPER QUADRANT COMPARISON:  01/08/2013. FINDINGS: Gallbladder: Multiple shadowing stones are identified. No gallbladder wall thickening or pericholecystic fluid. No sonographic Murphy sign noted by sonographer. Common bile duct: Diameter: 1.8 mm. Liver: No focal lesion identified. Within  normal limits in parenchymal echogenicity. Portal vein is patent on color Doppler imaging with normal direction of blood flow towards the liver. Other: No free fluid. IMPRESSION: Cholelithiasis without acute cholecystitis. Electronically Signed   By: Thornell SartoriusLaura  Taylor M.D.   On: 10/22/2021 04:30    MDM Start IV LR Bolus Antiemetic Pain Medication Abdominal US Assessment and Plan  21 year old G2P1011 TIUP at 17.2 weeks RUQ Pain  -POC reviewed. -Exam performed. -Start IV and give fluids. -Order pain medication and labs. -Discussed sending for abdominal ultrasound.  Cautioned that fetuses would not be assessed. -Patient without questions or concerns currently. -Will await results.  Cherre RobinsJessica L Solenne Manwarren 10/22/2021, 3:11 AM   Reassessment (3:42 AM) -Nurse reports IV placed, but patient refusing pain medication and antiemetic stating symptoms have resolved.  -We will send for ultrasound  Reassessment (5:02 AM) Cholelithiasis   -Results as above. -Provider to bedside to review. -Patient reports no pain. -Discussed management including  dietary modifications and medication usage. -Informed that we will send medication for nausea to pharmacy. -Encouraged to return if symptoms worsen or with onset of new symptoms. -Patient to follow-up with primary OB office as scheduled. -Discharged to home in stable condition.  Cherre Robins MSN, CNM Advanced Practice Provider, [facility]

## 2021-10-23 ENCOUNTER — Encounter: Payer: Self-pay | Admitting: *Deleted

## 2021-10-27 ENCOUNTER — Encounter: Payer: Self-pay | Admitting: *Deleted

## 2021-10-27 ENCOUNTER — Other Ambulatory Visit: Payer: Self-pay | Admitting: *Deleted

## 2021-10-27 ENCOUNTER — Ambulatory Visit: Payer: Medicaid Other | Attending: Obstetrics and Gynecology

## 2021-10-27 ENCOUNTER — Ambulatory Visit: Payer: Medicaid Other | Attending: Obstetrics and Gynecology | Admitting: Obstetrics and Gynecology

## 2021-10-27 ENCOUNTER — Other Ambulatory Visit: Payer: Self-pay

## 2021-10-27 ENCOUNTER — Ambulatory Visit: Payer: Medicaid Other | Admitting: *Deleted

## 2021-10-27 VITALS — BP 111/65 | HR 96

## 2021-10-27 DIAGNOSIS — O43123 Velamentous insertion of umbilical cord, third trimester: Secondary | ICD-10-CM | POA: Diagnosis not present

## 2021-10-27 DIAGNOSIS — O30042 Twin pregnancy, dichorionic/diamniotic, second trimester: Secondary | ICD-10-CM

## 2021-10-27 DIAGNOSIS — O99212 Obesity complicating pregnancy, second trimester: Secondary | ICD-10-CM | POA: Insufficient documentation

## 2021-10-27 DIAGNOSIS — O4392 Unspecified placental disorder, second trimester: Secondary | ICD-10-CM

## 2021-10-27 DIAGNOSIS — O43122 Velamentous insertion of umbilical cord, second trimester: Secondary | ICD-10-CM | POA: Diagnosis not present

## 2021-10-27 DIAGNOSIS — Z363 Encounter for antenatal screening for malformations: Secondary | ICD-10-CM | POA: Insufficient documentation

## 2021-10-27 DIAGNOSIS — E669 Obesity, unspecified: Secondary | ICD-10-CM

## 2021-10-27 DIAGNOSIS — Z3A19 19 weeks gestation of pregnancy: Secondary | ICD-10-CM

## 2021-10-27 NOTE — Progress Notes (Signed)
Maternal-Fetal Medicine  ? ?Name: Ariel Mooney ?DOB: 06/25/2001 ?MRN: [id number] ?Referring Provider: Allyn Kenner, DO ? ?I had the pleasure of seeing Ms. Ariel Mooney today at the [facility]. She is G2 P1 at 8-weeks' gestation with twin pregnancy and is here for fetal anatomy. ? ?This is a natural conception.  On cell-free fetal DNA screening, the risks of fetal aneuploidies are not increased.  Dizygotic twins were confirmed. ? ?Obstetric history significant for a term vaginal delivery in 2021.  Of a female infant weighing 8 pounds and 9 ounces at birth. ? ?GYN history: No history of abnormal Pap smears or cervical surgeries.  No history of breast disease. ?Medications: Low-dose aspirin, prenatal vitamins, montelukast and albuterol as needed. ?Allergies: No known drug allergies. ?Past medical history: Bronchial asthma.  Patient gets occasional episodes.  She was admitted to hospital for asthma management when she was in high school.  She does not give history of intubations. ?Past surgical history: Tonsillectomy. ?Social history: Denies tobacco or drug or alcohol use.  She is single and lives with her partner (father of her first child). ?Family history: No history of venous thromboembolism in the family. ? ?Ultrasound ? ?We confirmed dichorionic-diamniotic twin pregnancy. ? ?Twin A: Maternal right, breech presentation, posterior placenta previa with possible velamentous cord insertion.  Female fetus.  Amniotic fluid is normal and good fetal activity seen.  No markers of aneuploidies or fetal structural defects are seen.  Fetal biometry is consistent with the previously established dates. ? ?Twin B: Maternal left, cephalic presentation, anterior placenta.  Female fetus.  Amniotic fluid is normal and good fetal activity seen. No markers of aneuploidies or fetal structural defects are seen.  Fetal biometry is consistent with the previously established dates. ? ?Growth discordancy: 4%  (normal). ? ?Twin pregnancy  ?I explained the significance of chorionicity and its implications. Twin pregnancy complications include preterm labor/delivery (most common), fetal growth restriction of one or both twins, miscarriage, malpresentations and increased cesarean delivery rate, postpartum hemorrhage. I also informed her that maternal complications including gestational diabetes and gestational hypertension/preeclampsia are more common. ?I discussed the mode of delivery that is based on the presentations.  If both have Vx/Vx or Vx/non-vertex presentations, vaginal delivery may be considered. In Vx/non-vx, vaginal delivery followed by internal podalic version of second twin will achieve vaginal delivery. In non-vx first twin presentation, cesarean delivery will be performed. ? ?I emphasized the importance of weight gain (24 lbs by 24 weeks) to improve fetal weight and decrease the chances of preterm delivery. ?Low-dose aspirin is beneficial in preventing preeclampsia. Twin pregnancy is at high risk for preeclampsia. ? ?Velamentous cord insertion ?I explained the finding with help of diagrams and informed her that velamentous cord insertion can be associated with fetal growth restriction in some cases. ?We will perform transvaginal ultrasound at her next visit to rule out vasa previa. ? ?Recommendations ?-An appointment was made for her to return in 4 weeks for completion of fetal anatomy. ?-Serial fetal growth assessments every 4 weeks. ?-Weekly antenatal testing at 57 and 37 weeks. ?-Delivery at 38 weeks provided no pregnancy complications exist.  ?-Aspirin 81 mg daily until delivery. ?Thank you for consultation.  If you have any questions or concerns, please contact me the [facility].  Consultation including face-to-face (more than 50%) counseling 30 minutes. ? ?

## 2021-11-27 ENCOUNTER — Ambulatory Visit: Payer: Medicaid Other | Attending: Obstetrics and Gynecology

## 2021-11-27 ENCOUNTER — Other Ambulatory Visit: Payer: Self-pay | Admitting: *Deleted

## 2021-11-27 ENCOUNTER — Ambulatory Visit: Payer: Medicaid Other | Admitting: *Deleted

## 2021-11-27 VITALS — BP 110/63 | HR 93

## 2021-11-27 DIAGNOSIS — Z362 Encounter for other antenatal screening follow-up: Secondary | ICD-10-CM

## 2021-11-27 DIAGNOSIS — O43123 Velamentous insertion of umbilical cord, third trimester: Secondary | ICD-10-CM

## 2021-11-27 DIAGNOSIS — E669 Obesity, unspecified: Secondary | ICD-10-CM

## 2021-11-27 DIAGNOSIS — O30042 Twin pregnancy, dichorionic/diamniotic, second trimester: Secondary | ICD-10-CM | POA: Diagnosis present

## 2021-11-27 DIAGNOSIS — O4392 Unspecified placental disorder, second trimester: Secondary | ICD-10-CM | POA: Diagnosis present

## 2021-11-27 DIAGNOSIS — Z3689 Encounter for other specified antenatal screening: Secondary | ICD-10-CM

## 2021-11-27 DIAGNOSIS — O99212 Obesity complicating pregnancy, second trimester: Secondary | ICD-10-CM

## 2021-11-27 DIAGNOSIS — Z3A23 23 weeks gestation of pregnancy: Secondary | ICD-10-CM

## 2021-12-25 ENCOUNTER — Other Ambulatory Visit: Payer: Self-pay | Admitting: Family Medicine

## 2022-01-01 ENCOUNTER — Ambulatory Visit: Payer: Medicaid Other

## 2022-01-05 ENCOUNTER — Encounter: Payer: Self-pay | Admitting: *Deleted

## 2022-01-05 ENCOUNTER — Ambulatory Visit: Payer: Medicaid Other | Attending: Obstetrics and Gynecology

## 2022-01-05 ENCOUNTER — Ambulatory Visit: Payer: Medicaid Other | Admitting: *Deleted

## 2022-01-05 ENCOUNTER — Other Ambulatory Visit: Payer: Self-pay | Admitting: *Deleted

## 2022-01-05 VITALS — BP 112/68 | HR 92

## 2022-01-05 DIAGNOSIS — E669 Obesity, unspecified: Secondary | ICD-10-CM

## 2022-01-05 DIAGNOSIS — Z3689 Encounter for other specified antenatal screening: Secondary | ICD-10-CM | POA: Diagnosis not present

## 2022-01-05 DIAGNOSIS — O30043 Twin pregnancy, dichorionic/diamniotic, third trimester: Secondary | ICD-10-CM | POA: Insufficient documentation

## 2022-01-05 DIAGNOSIS — O30042 Twin pregnancy, dichorionic/diamniotic, second trimester: Secondary | ICD-10-CM

## 2022-01-05 DIAGNOSIS — O43123 Velamentous insertion of umbilical cord, third trimester: Secondary | ICD-10-CM

## 2022-01-05 DIAGNOSIS — O99212 Obesity complicating pregnancy, second trimester: Secondary | ICD-10-CM

## 2022-01-05 DIAGNOSIS — Z3A29 29 weeks gestation of pregnancy: Secondary | ICD-10-CM

## 2022-01-18 ENCOUNTER — Telehealth: Payer: Self-pay

## 2022-01-18 NOTE — Telephone Encounter (Signed)
Spoke to patient to let her know that we refused a refill with her albuterol hfa bc we haven's seen her for a year. Patient stated that her ob-gyn had filled it for her. She is pregnant and will have her baby in July and she said will make an appointment to see [place] then.

## 2022-01-21 ENCOUNTER — Inpatient Hospital Stay (HOSPITAL_COMMUNITY)
Admission: AD | Admit: 2022-01-21 | Discharge: 2022-01-21 | Disposition: A | Discharge status: Home / Self Care | Payer: Medicaid Other | Attending: Obstetrics and Gynecology | Admitting: Obstetrics and Gynecology

## 2022-01-21 ENCOUNTER — Encounter (HOSPITAL_COMMUNITY): Payer: Self-pay | Admitting: Obstetrics and Gynecology

## 2022-01-21 ENCOUNTER — Other Ambulatory Visit: Payer: Self-pay

## 2022-01-21 DIAGNOSIS — O4700 False labor before 37 completed weeks of gestation, unspecified trimester: Secondary | ICD-10-CM | POA: Insufficient documentation

## 2022-01-21 DIAGNOSIS — O26893 Other specified pregnancy related conditions, third trimester: Secondary | ICD-10-CM | POA: Insufficient documentation

## 2022-01-21 DIAGNOSIS — Z3A31 31 weeks gestation of pregnancy: Secondary | ICD-10-CM | POA: Diagnosis not present

## 2022-01-21 DIAGNOSIS — O26899 Other specified pregnancy related conditions, unspecified trimester: Secondary | ICD-10-CM

## 2022-01-21 DIAGNOSIS — O30043 Twin pregnancy, dichorionic/diamniotic, third trimester: Secondary | ICD-10-CM | POA: Diagnosis present

## 2022-01-21 DIAGNOSIS — R110 Nausea: Secondary | ICD-10-CM | POA: Insufficient documentation

## 2022-01-21 DIAGNOSIS — O479 False labor, unspecified: Secondary | ICD-10-CM | POA: Diagnosis not present

## 2022-01-21 LAB — URINALYSIS, ROUTINE W REFLEX MICROSCOPIC
Bilirubin Urine: NEGATIVE
Glucose, UA: NEGATIVE mg/dL
Hgb urine dipstick: NEGATIVE
Ketones, ur: 20 mg/dL — AB
Leukocytes,Ua: NEGATIVE
Nitrite: NEGATIVE
Protein, ur: NEGATIVE mg/dL
Specific Gravity, Urine: 1.015 (ref 1.005–1.030)
pH: 6 (ref 5.0–8.0)

## 2022-01-21 MED ORDER — ONDANSETRON 4 MG PO TBDP: 4.0000 mg | ORAL_TABLET | Freq: Three times a day (TID) | ORAL | 0 refills | Status: DC | PRN

## 2022-01-21 NOTE — MAU Provider Note (Signed)
History     [id number]  Arrival date and time: 01/21/22 0737    Chief Complaint  Patient presents with   Abdominal Pain     HPI Dorthy CoolerYasmine M Molyneux is a 21 y.o. at 5963w2d who presents for contractions. States yesterday she felt like she lost her mucous plug. Since this morning has had back to back abdominal tightening. No pain with tightening. Has had loose stools since yesterday. Denies fever, vomiting, dysuria, vaginal bleeding, or LOF. Reports good fetal movement x 2. No recent intercourse. Denies complications with the pregnancy.    OB History     Gravida  2   Para  1   Term  1   Preterm      AB      Living  1      SAB      IAB      Ectopic      Multiple  0   Live Births  1           Past Medical History:  Diagnosis Date   Abdominal pain, recurrent    Asthma    since high school not good control   Blood transfusion without reported diagnosis    Gall stones    Intrinsic atopic dermatitis 02/09/2021    Past Surgical History:  Procedure Laterality Date   TONSILLECTOMY  08/20/2008   WISDOM TOOTH EXTRACTION      Family History  Problem Relation Age of Onset   Asthma Sister    Asthma Brother    Cholelithiasis Sister    Asthma Sister    Asthma Sister    Asthma Brother    Asthma Brother    Asthma Mother    Diabetes Mother    Hypothyroidism Mother    Ulcers Maternal Grandmother    Cholelithiasis Maternal Grandmother    Asthma Maternal Grandmother    Allergic rhinitis Neg Hx    Angioedema Neg Hx    Eczema Neg Hx    Immunodeficiency Neg Hx    Urticaria Neg Hx     Allergies  Allergen Reactions   Lac Bovis Hives   Lactose Intolerance (Gi) Hives and Shortness Of Breath   Latex Shortness Of Breath and Rash    No current facility-administered medications on file prior to encounter.   Current Outpatient Medications on File Prior to Encounter  Medication Sig Dispense Refill   aspirin EC 81 MG tablet Take 81 mg by mouth daily. Swallow  whole.     mometasone-formoterol (DULERA) 200-5 MCG/ACT AERO Inhale 2 puffs into the lungs 2 (two) times daily. 13 g 5   montelukast (SINGULAIR) 10 MG tablet Take 1 tablet (10 mg total) by mouth at bedtime. 30 tablet 5   Prenatal Vit-Fe Fumarate-FA (PRENATAL VITAMINS PO) Take by mouth.     PROAIR DIGIHALER 108 (90 Base) MCG/ACT AEPB Inhale 2 puffs into the lungs every 4 (four) hours as needed. 1 each 1   promethazine (PHENERGAN) 12.5 MG tablet Take 1 tablet (12.5 mg total) by mouth every 6 (six) hours as needed for nausea or vomiting. 30 tablet 0   Tiotropium Bromide Monohydrate (SPIRIVA RESPIMAT) 1.25 MCG/ACT AERS Inhale 2 puffs once a day to prevent cough or wheeze. 4 g 5   VENTOLIN HFA 108 (90 Base) MCG/ACT inhaler INHALE 2 PUFFS INTO THE LUNGS EVERY 4 HOURS AS NEEDED FOR WHEEZING OR SHORTNESS OF BREATH 18 g 0     ROS Pertinent positives and negative per HPI, all others reviewed and  negative  Physical Exam   BP (!) 105/50 (BP Location: Left Arm)   Pulse (!) 108   Temp 97.9 F (36.6 C) (Oral)   Resp 17   Ht 5\' 1"  (1.549 m)   Wt 108.7 kg   LMP 06/18/2021 (Approximate)   SpO2 96%   BMI 45.29 kg/m   Patient Vitals for the past 24 hrs:  BP Temp Temp src Pulse Resp SpO2 Height Weight  01/21/22 0818 (!) 105/50 97.9 F (36.6 C) Oral (!) 108 17 96 % -- --  01/21/22 0757 119/63 97.9 F (36.6 C) Oral 95 16 98 % -- --  01/21/22 0752 -- -- -- -- -- -- 5\' 1"  (1.549 m) 108.7 kg    Physical Exam Vitals and nursing note reviewed. Exam conducted with a chaperone present.  Constitutional:      General: She is not in acute distress.    Appearance: She is well-developed.  HENT:     Head: Normocephalic and atraumatic.  Eyes:     General: No scleral icterus.    Pupils: Pupils are equal, round, and reactive to light.  Pulmonary:     Effort: Pulmonary effort is normal. No respiratory distress.  Abdominal:     Palpations: Abdomen is soft.     Tenderness: There is no abdominal tenderness.   Skin:    General: Skin is warm and dry.  Neurological:     Mental Status: She is alert.     Cervical Exam Dilation: Closed Effacement (%): 50 Cervical Position: Posterior Station: Ballotable Exam by:: Marsela Kuan np  Fetal Tracing: Baby A Baseline: 130 Variability: moderate Accelerations: 15x15 Decelerations: none  Baby B Baseline: 140 Variability: moderate Accelerations: 15x15 Decelerations: none  Toco: regular UI Q1-3 minutes, lasting 10-50 seconds    Labs Results for orders placed or performed during the hospital encounter of 01/21/22 (from the past 24 hour(s))  Urinalysis, Routine w reflex microscopic Urine, Clean Catch     Status: Abnormal   Collection Time: 01/21/22  8:06 AM  Result Value Ref Range   Color, Urine YELLOW YELLOW   APPearance HAZY (A) CLEAR   Specific Gravity, Urine 1.015 1.005 - 1.030   pH 6.0 5.0 - 8.0   Glucose, UA NEGATIVE NEGATIVE mg/dL   Hgb urine dipstick NEGATIVE NEGATIVE   Bilirubin Urine NEGATIVE NEGATIVE   Ketones, ur 20 (A) NEGATIVE mg/dL   Protein, ur NEGATIVE NEGATIVE mg/dL   Nitrite NEGATIVE NEGATIVE   Leukocytes,Ua NEGATIVE NEGATIVE    Imaging No results found.  MAU Course  Procedures Lab Orders         Urinalysis, Routine w reflex microscopic Urine, Clean Catch    Meds ordered this encounter  Medications   ondansetron (ZOFRAN-ODT) 4 MG disintegrating tablet    Sig: Take 1 tablet (4 mg total) by mouth every 8 (eight) hours as needed for nausea or vomiting.    Dispense:  15 tablet    Refill:  0    Order Specific Question:   Supervising Provider    Answer:   03/23/22 03/23/22   Imaging Orders  No imaging studies ordered today    MDM Patient with uterine irritability on monitor that improved with oral fluids. She denies pain. Cervix closed & unchanged after 1+ hour of monitoring.   Reviewed prenatal records scanned under media  Patient requests antiemetic rx for home  Assessment and Plan   1.  Braxton Hicks contractions  -Reviewed preterm labor precautions & reasons to return to MAU  2. Pregnancy related nausea, antepartum  -Rx zofran  3. Dichorionic diamniotic twin pregnancy in third trimester   4. [redacted] weeks gestation of pregnancy  -Keep scheduled OB appointment next week     Judeth Horn, NP 01/21/22 10:37 AM

## 2022-01-21 NOTE — MAU Note (Signed)
Ariel Mooney is a 21 y.o. at [redacted]w[redacted]d here in MAU reporting: yesterday lost her mucus plug, called the office and was told to come in for evaluation if she had more then 3 tightening pains in an hour. Started having tightening pain this morning at 6. No bleeding, no LOF. +FM x2.  Onset of complaint: yesterday  Pain score: 8/10  Vitals:   01/21/22 0757  BP: 119/63  Pulse: 95  Resp: 16  Temp: 97.9 F (36.6 C)  SpO2: 98%     WLS:LHTD A 128, Baby B 135  Lab orders placed from triage: UA

## 2022-01-29 ENCOUNTER — Other Ambulatory Visit: Payer: Self-pay | Admitting: Family Medicine

## 2022-02-05 ENCOUNTER — Ambulatory Visit: Payer: Medicaid Other | Admitting: *Deleted

## 2022-02-05 ENCOUNTER — Ambulatory Visit: Payer: Medicaid Other | Attending: Maternal & Fetal Medicine

## 2022-02-05 ENCOUNTER — Encounter: Payer: Self-pay | Admitting: *Deleted

## 2022-02-05 ENCOUNTER — Other Ambulatory Visit: Payer: Self-pay | Admitting: *Deleted

## 2022-02-05 VITALS — BP 119/66 | HR 91

## 2022-02-05 DIAGNOSIS — O30042 Twin pregnancy, dichorionic/diamniotic, second trimester: Secondary | ICD-10-CM

## 2022-02-05 DIAGNOSIS — Z3689 Encounter for other specified antenatal screening: Secondary | ICD-10-CM | POA: Insufficient documentation

## 2022-02-05 DIAGNOSIS — O99213 Obesity complicating pregnancy, third trimester: Secondary | ICD-10-CM | POA: Diagnosis not present

## 2022-02-05 DIAGNOSIS — O99212 Obesity complicating pregnancy, second trimester: Secondary | ICD-10-CM

## 2022-02-05 DIAGNOSIS — E669 Obesity, unspecified: Secondary | ICD-10-CM

## 2022-02-05 DIAGNOSIS — O43123 Velamentous insertion of umbilical cord, third trimester: Secondary | ICD-10-CM | POA: Diagnosis not present

## 2022-02-05 DIAGNOSIS — O30043 Twin pregnancy, dichorionic/diamniotic, third trimester: Secondary | ICD-10-CM

## 2022-02-05 DIAGNOSIS — Z3A33 33 weeks gestation of pregnancy: Secondary | ICD-10-CM

## 2022-02-11 ENCOUNTER — Other Ambulatory Visit: Payer: Self-pay | Admitting: Family Medicine

## 2022-02-12 ENCOUNTER — Encounter (HOSPITAL_COMMUNITY): Payer: Self-pay | Admitting: Obstetrics and Gynecology

## 2022-02-12 ENCOUNTER — Inpatient Hospital Stay (HOSPITAL_COMMUNITY)
Admission: AD | Admit: 2022-02-12 | Discharge: 2022-02-12 | Disposition: A | Discharge status: Home / Self Care | Payer: Medicaid Other | Attending: Obstetrics and Gynecology | Admitting: Obstetrics and Gynecology

## 2022-02-12 DIAGNOSIS — O30043 Twin pregnancy, dichorionic/diamniotic, third trimester: Secondary | ICD-10-CM | POA: Insufficient documentation

## 2022-02-12 DIAGNOSIS — M549 Dorsalgia, unspecified: Secondary | ICD-10-CM

## 2022-02-12 DIAGNOSIS — O26893 Other specified pregnancy related conditions, third trimester: Secondary | ICD-10-CM | POA: Diagnosis present

## 2022-02-12 DIAGNOSIS — R102 Pelvic and perineal pain: Secondary | ICD-10-CM

## 2022-02-12 DIAGNOSIS — O99891 Other specified diseases and conditions complicating pregnancy: Secondary | ICD-10-CM | POA: Diagnosis present

## 2022-02-12 DIAGNOSIS — Z3689 Encounter for other specified antenatal screening: Secondary | ICD-10-CM

## 2022-02-12 DIAGNOSIS — Z3A34 34 weeks gestation of pregnancy: Secondary | ICD-10-CM

## 2022-02-12 LAB — WET PREP, GENITAL
Clue Cells Wet Prep HPF POC: NONE SEEN
Sperm: NONE SEEN
Trich, Wet Prep: NONE SEEN
WBC, Wet Prep HPF POC: >=10 — AB (ref <10)
Yeast Wet Prep HPF POC: NONE SEEN

## 2022-02-12 MED ORDER — MAGNESIUM OXIDE -MG SUPPLEMENT 200 MG PO TABS: 400.0000 mg | ORAL_TABLET | Freq: Every day | ORAL | 3 refills | Status: DC

## 2022-02-12 MED ORDER — CYCLOBENZAPRINE HCL 10 MG PO TABS: 10.0000 mg | ORAL_TABLET | Freq: Every day | ORAL | 1 refills | Status: DC

## 2022-02-12 MED ORDER — CYCLOBENZAPRINE HCL 5 MG PO TABS
10.0000 mg | ORAL_TABLET | Freq: Once | ORAL | Status: AC
  Administered 2022-02-12: 10 mg via ORAL
  Filled 2022-02-12: qty 2

## 2022-02-12 MED ORDER — ACETAMINOPHEN 500 MG PO TABS
1000.0000 mg | ORAL_TABLET | Freq: Once | ORAL | Status: AC
  Administered 2022-02-12: 1000 mg via ORAL
  Filled 2022-02-12: qty 2

## 2022-02-12 NOTE — MAU Provider Note (Signed)
Chief Complaint:  Back Pain   Event Date/Time   First Provider Initiated Contact with Patient 02/12/22 2106     HPI: Ariel Mooney is a 21 y.o. G2P1001 at [redacted]w[redacted]d who presents to maternity admissions reporting uterine cramping (mild, intermittent but present), low back pain, pelvic pain, increased vaginal discharge, nausea over the weekend (none now) and a sensation of being unable to move her legs when she first stands up then hearing a popping sound when walking. Denies vaginal bleeding, leaking of fluid, decreased fetal movement, fever, falls, or recent illness.   Pregnancy Course: Receives prenatal care at [facility].  Past Medical History:  Diagnosis Date   Abdominal pain, recurrent    Asthma    since high school not good control   Blood transfusion without reported diagnosis    Gall stones    Intrinsic atopic dermatitis 02/09/2021   OB History  Gravida Para Term Preterm AB Living  2 1 1     1   SAB IAB Ectopic Multiple Live Births        0 1    # Outcome Date GA Lbr Len/2nd Weight Sex Delivery Anes PTL Lv  2 Current           1 Term 11/25/19 [redacted]w[redacted]d 02:40 / 01:40 8 lb 9.7 oz (3.905 kg) M Vag-Spont EPI  LIV   Past Surgical History:  Procedure Laterality Date   TONSILLECTOMY  08/20/2008   WISDOM TOOTH EXTRACTION     Family History  Problem Relation Age of Onset   Asthma Sister    Asthma Brother    Cholelithiasis Sister    Asthma Sister    Asthma Sister    Asthma Brother    Asthma Brother    Asthma Mother    Diabetes Mother    Hypothyroidism Mother    Ulcers Maternal Grandmother    Cholelithiasis Maternal Grandmother    Asthma Maternal Grandmother    Allergic rhinitis Neg Hx    Angioedema Neg Hx    Eczema Neg Hx    Immunodeficiency Neg Hx    Urticaria Neg Hx    Social History   Tobacco Use   Smoking status: Never   Smokeless tobacco: Never  Vaping Use   Vaping Use: Never used  Substance Use Topics   Alcohol use: No   Drug use: No    Allergies  Allergen Reactions   Lac Bovis Hives   Lactose Intolerance (Gi) Hives and Shortness Of Breath   Latex Shortness Of Breath and Rash   Medications Prior to Admission  Medication Sig Dispense Refill Last Dose   aspirin EC 81 MG tablet Take 81 mg by mouth daily. Swallow whole.   02/11/2022   montelukast (SINGULAIR) 10 MG tablet Take 1 tablet (10 mg total) by mouth at bedtime. 30 tablet 5 02/11/2022   Prenatal Vit-Fe Fumarate-FA (PRENATAL VITAMINS PO) Take by mouth.   02/11/2022   PROAIR DIGIHALER 108 (90 Base) MCG/ACT AEPB Inhale 2 puffs into the lungs every 4 (four) hours as needed. 1 each 1 Past Week   mometasone-formoterol (DULERA) 200-5 MCG/ACT AERO Inhale 2 puffs into the lungs 2 (two) times daily. 13 g 5    ondansetron (ZOFRAN-ODT) 4 MG disintegrating tablet Take 1 tablet (4 mg total) by mouth every 8 (eight) hours as needed for nausea or vomiting. 15 tablet 0    promethazine (PHENERGAN) 12.5 MG tablet Take 1 tablet (12.5 mg total) by mouth every 6 (six) hours as needed for nausea  or vomiting. 30 tablet 0    Tiotropium Bromide Monohydrate (SPIRIVA RESPIMAT) 1.25 MCG/ACT AERS Inhale 2 puffs once a day to prevent cough or wheeze. 4 g 5    VENTOLIN HFA 108 (90 Base) MCG/ACT inhaler INHALE 2 PUFFS INTO THE LUNGS EVERY 4 HOURS AS NEEDED FOR WHEEZING OR SHORTNESS OF BREATH 18 g 0    I have reviewed patient's Past Medical Hx, Surgical Hx, Family Hx, Social Hx, medications and allergies.   ROS:  Pertinent items noted in HPI and remainder of comprehensive ROS otherwise negative.   Physical Exam  Patient Vitals for the past 24 hrs:  BP Temp Temp src Pulse Resp Height Weight  02/12/22 2026 118/74 97.8 F (36.6 C) Oral (!) 106 20 5\' 1"  (1.549 m) 250 lb 11.2 oz (113.7 kg)   Constitutional: Well-developed, well-nourished female in no acute distress.  Cardiovascular: normal rate & rhythm Respiratory: normal effort GI: Abd soft, non-tender, gravid appropriate for gestational age MS:  Extremities nontender, no edema, normal ROM Neurologic: Alert and oriented x 4.  GU: no CVA tenderness Pelvic: NEFG, physiologic discharge, no blood, cervix clean.   Dilation: Closed Cervical Position: Posterior Exam by:: J. Devery Murgia,CNM  Fetal Tracing: reactive Baseline: 135/155 Variability: moderate Accelerations: 15x15 Decelerations: none Toco: UI   Labs: Results for orders placed or performed during the hospital encounter of 02/12/22 (from the past 24 hour(s))  Wet prep, genital     Status: Abnormal   Collection Time: 02/12/22  9:23 PM  Result Value Ref Range   Yeast Wet Prep HPF POC NONE SEEN NONE SEEN   Trich, Wet Prep NONE SEEN NONE SEEN   Clue Cells Wet Prep HPF POC NONE SEEN NONE SEEN   WBC, Wet Prep HPF POC >=10 (A) <10   Sperm NONE SEEN    Imaging:  No results found.  MAU Course: Orders Placed This Encounter  Procedures   Wet prep, genital   Ambulatory referral to Physical Therapy   Discharge patient   Meds ordered this encounter  Medications   acetaminophen (TYLENOL) tablet 1,000 mg   cyclobenzaprine (FLEXERIL) tablet 10 mg   Magnesium Oxide -Mg Supplement (MAG-OXIDE) 200 MG TABS    Sig: Take 2 tablets (400 mg total) by mouth at bedtime. If that amount causes loose stools in the am, switch to 200mg  daily at bedtime.    Dispense:  60 tablet    Refill:  3    Order Specific Question:   Supervising Provider    Answer:   02/14/22 [[id number]]   cyclobenzaprine (FLEXERIL) 10 MG tablet    Sig: Take 1 tablet (10 mg total) by mouth at bedtime.    Dispense:  30 tablet    Refill:  1    Order Specific Question:   Supervising Provider    Answer:     MDM: Pt reassured by normal wet prep and UA. Discussed ways to help treat and prevent low back pain in pregnancy as she progresses through the end of this pregnancy. Recommended twice daily stretching of entire back, use of a pregnancy support belt and nightly magnesium/flexeril to help relieve  pain. Pt amenable to plan.  Pain better but still experienced some UI. Declined procardia series and preferred to go home, hydrate and rest since her cervix was closed.  Assessment: 1. Pelvic pain affecting pregnancy in third trimester, antepartum   2. Back pain affecting pregnancy in third trimester   3. Vaginal discharge during pregnancy in third  trimester   4. Dichorionic diamniotic twin pregnancy in third trimester   5. [redacted] weeks gestation of pregnancy   6. NST (non-stress test) reactive    Plan: Discharge home in stable condition with preterm labor precautions    Follow-up Information     Ob/Gyn, [facility] Follow up.   Why: as scheduled for ongoing prenatal care Contact information: [address] [place] [address] [phone]                 Allergies as of 02/12/2022       Reactions   Lac Bovis Hives   Lactose Intolerance (gi) Hives, Shortness Of Breath   Latex Shortness Of Breath, Rash        Medication List     TAKE these medications    aspirin EC 81 MG tablet Take 81 mg by mouth daily. Swallow whole.   cyclobenzaprine 10 MG tablet Commonly known as: FLEXERIL Take 1 tablet (10 mg total) by mouth at bedtime.   Dulera 200-5 MCG/ACT Aero Generic drug: mometasone-formoterol Inhale 2 puffs into the lungs 2 (two) times daily.   Magnesium Oxide -Mg Supplement 200 MG Tabs Commonly known as: Mag-Oxide Take 2 tablets (400 mg total) by mouth at bedtime. If that amount causes loose stools in the am, switch to 200mg  daily at bedtime.   montelukast 10 MG tablet Commonly known as: SINGULAIR Take 1 tablet (10 mg total) by mouth at bedtime.   ondansetron 4 MG disintegrating tablet Commonly known as: ZOFRAN-ODT Take 1 tablet (4 mg total) by mouth every 8 (eight) hours as needed for nausea or vomiting.   PRENATAL VITAMINS PO Take by mouth.   ProAir Digihaler 108 (90 Base) MCG/ACT Aepb Generic drug: Albuterol Sulfate (sensor) Inhale 2  puffs into the lungs every 4 (four) hours as needed.   promethazine 12.5 MG tablet Commonly known as: PHENERGAN Take 1 tablet (12.5 mg total) by mouth every 6 (six) hours as needed for nausea or vomiting.   Spiriva Respimat 1.25 MCG/ACT Aers Generic drug: Tiotropium Bromide Monohydrate Inhale 2 puffs once a day to prevent cough or wheeze.   Ventolin HFA 108 (90 Base) MCG/ACT inhaler Generic drug: albuterol INHALE 2 PUFFS INTO THE LUNGS EVERY 4 HOURS AS NEEDED FOR WHEEZING OR SHORTNESS OF BREATH        , CNM, MSN, IBCLC Certified Nurse Midwife, [facility]

## 2022-02-13 LAB — GC/CHLAMYDIA PROBE AMP (~~LOC~~) NOT AT ARMC
Chlamydia: NEGATIVE
Comment: NEGATIVE
Comment: NORMAL
Neisseria Gonorrhea: NEGATIVE

## 2022-02-14 ENCOUNTER — Ambulatory Visit: Payer: Medicaid Other

## 2022-02-15 ENCOUNTER — Ambulatory Visit: Payer: Medicaid Other | Attending: Obstetrics

## 2022-02-15 ENCOUNTER — Ambulatory Visit: Payer: Medicaid Other

## 2022-02-21 ENCOUNTER — Ambulatory Visit: Payer: Medicaid Other | Admitting: *Deleted

## 2022-02-21 ENCOUNTER — Ambulatory Visit: Payer: Medicaid Other | Attending: Obstetrics

## 2022-02-21 VITALS — BP 111/66 | HR 115

## 2022-02-21 DIAGNOSIS — E669 Obesity, unspecified: Secondary | ICD-10-CM

## 2022-02-21 DIAGNOSIS — O30043 Twin pregnancy, dichorionic/diamniotic, third trimester: Secondary | ICD-10-CM

## 2022-02-21 DIAGNOSIS — O43123 Velamentous insertion of umbilical cord, third trimester: Secondary | ICD-10-CM | POA: Diagnosis not present

## 2022-02-21 DIAGNOSIS — O99213 Obesity complicating pregnancy, third trimester: Secondary | ICD-10-CM

## 2022-02-21 DIAGNOSIS — Z3A35 35 weeks gestation of pregnancy: Secondary | ICD-10-CM

## 2022-02-21 LAB — OB RESULTS CONSOLE GBS: GBS: NEGATIVE

## 2022-02-28 ENCOUNTER — Ambulatory Visit: Payer: Medicaid Other | Attending: Obstetrics

## 2022-02-28 ENCOUNTER — Encounter: Payer: Self-pay | Admitting: *Deleted

## 2022-02-28 ENCOUNTER — Ambulatory Visit: Payer: Medicaid Other | Admitting: *Deleted

## 2022-02-28 VITALS — BP 123/73 | HR 101

## 2022-02-28 DIAGNOSIS — Z6841 Body Mass Index (BMI) 40.0 and over, adult: Secondary | ICD-10-CM | POA: Diagnosis present

## 2022-02-28 DIAGNOSIS — E669 Obesity, unspecified: Secondary | ICD-10-CM | POA: Diagnosis not present

## 2022-02-28 DIAGNOSIS — Z3A36 36 weeks gestation of pregnancy: Secondary | ICD-10-CM | POA: Insufficient documentation

## 2022-02-28 DIAGNOSIS — O99213 Obesity complicating pregnancy, third trimester: Secondary | ICD-10-CM | POA: Diagnosis not present

## 2022-02-28 DIAGNOSIS — O43123 Velamentous insertion of umbilical cord, third trimester: Secondary | ICD-10-CM | POA: Insufficient documentation

## 2022-02-28 DIAGNOSIS — O30043 Twin pregnancy, dichorionic/diamniotic, third trimester: Secondary | ICD-10-CM | POA: Diagnosis present

## 2022-03-01 ENCOUNTER — Inpatient Hospital Stay (HOSPITAL_COMMUNITY)
Admission: AD | Admit: 2022-03-01 | Discharge: 2022-03-03 | DRG: 805 | Disposition: A | Discharge status: Home / Self Care | Payer: Medicaid Other | Attending: Obstetrics and Gynecology | Admitting: Obstetrics and Gynecology

## 2022-03-01 ENCOUNTER — Other Ambulatory Visit: Payer: Self-pay

## 2022-03-01 ENCOUNTER — Encounter (HOSPITAL_COMMUNITY)
Admission: AD | Disposition: A | Discharge status: Home / Self Care | Payer: Self-pay | Source: Home / Self Care | Attending: Obstetrics and Gynecology

## 2022-03-01 ENCOUNTER — Inpatient Hospital Stay (HOSPITAL_COMMUNITY): Payer: Medicaid Other | Admitting: Anesthesiology

## 2022-03-01 ENCOUNTER — Encounter (HOSPITAL_COMMUNITY): Payer: Self-pay | Admitting: Obstetrics and Gynecology

## 2022-03-01 DIAGNOSIS — J45909 Unspecified asthma, uncomplicated: Secondary | ICD-10-CM | POA: Diagnosis present

## 2022-03-01 DIAGNOSIS — O9962 Diseases of the digestive system complicating childbirth: Secondary | ICD-10-CM | POA: Diagnosis present

## 2022-03-01 DIAGNOSIS — Z3A36 36 weeks gestation of pregnancy: Secondary | ICD-10-CM

## 2022-03-01 DIAGNOSIS — Z7982 Long term (current) use of aspirin: Secondary | ICD-10-CM | POA: Diagnosis not present

## 2022-03-01 DIAGNOSIS — O30043 Twin pregnancy, dichorionic/diamniotic, third trimester: Principal | ICD-10-CM | POA: Diagnosis present

## 2022-03-01 DIAGNOSIS — O43123 Velamentous insertion of umbilical cord, third trimester: Secondary | ICD-10-CM | POA: Diagnosis present

## 2022-03-01 DIAGNOSIS — K8020 Calculus of gallbladder without cholecystitis without obstruction: Secondary | ICD-10-CM | POA: Diagnosis present

## 2022-03-01 DIAGNOSIS — Z0371 Encounter for suspected problem with amniotic cavity and membrane ruled out: Principal | ICD-10-CM

## 2022-03-01 DIAGNOSIS — O30003 Twin pregnancy, unspecified number of placenta and unspecified number of amniotic sacs, third trimester: Secondary | ICD-10-CM

## 2022-03-01 DIAGNOSIS — O99214 Obesity complicating childbirth: Secondary | ICD-10-CM | POA: Diagnosis present

## 2022-03-01 DIAGNOSIS — O9952 Diseases of the respiratory system complicating childbirth: Secondary | ICD-10-CM | POA: Diagnosis present

## 2022-03-01 DIAGNOSIS — O479 False labor, unspecified: Secondary | ICD-10-CM

## 2022-03-01 LAB — CBC
HCT: 41.8 % (ref 36.0–46.0)
Hemoglobin: 13.8 g/dL (ref 12.0–15.0)
MCH: 30.7 pg (ref 26.0–34.0)
MCHC: 33 g/dL (ref 30.0–36.0)
MCV: 92.9 fL (ref 80.0–100.0)
Platelets: 182 10*3/uL (ref 150–400)
RBC: 4.5 MIL/uL (ref 3.87–5.11)
RDW: 14.9 % (ref 11.5–15.5)
WBC: 10.4 10*3/uL (ref 4.0–10.5)
nRBC: 0 % (ref 0.0–0.2)

## 2022-03-01 LAB — POCT FERN TEST: POCT Fern Test: NEGATIVE

## 2022-03-01 LAB — TYPE AND SCREEN
ABO/RH(D): O POS
Antibody Screen: NEGATIVE

## 2022-03-01 LAB — RPR: RPR Ser Ql: NONREACTIVE

## 2022-03-01 SURGERY — Surgical Case
Anesthesia: Regional

## 2022-03-01 SURGERY — Surgical Case
Anesthesia: Epidural

## 2022-03-01 MED ORDER — ONDANSETRON HCL 4 MG/2ML IJ SOLN: 4.0000 mg | Freq: Four times a day (QID) | INTRAMUSCULAR | Status: DC | PRN

## 2022-03-01 MED ORDER — FENTANYL CITRATE (PF) 100 MCG/2ML IJ SOLN: 50.0000 ug | INTRAMUSCULAR | Status: DC | PRN

## 2022-03-01 MED ORDER — LIDOCAINE HCL (PF) 1 % IJ SOLN
INTRAMUSCULAR | Status: DC | PRN
  Administered 2022-03-01: 11 mL via EPIDURAL

## 2022-03-01 MED ORDER — EPHEDRINE 5 MG/ML INJ: 10.0000 mg | INTRAVENOUS | Status: DC | PRN

## 2022-03-01 MED ORDER — OXYCODONE-ACETAMINOPHEN 5-325 MG PO TABS: 1.0000 | ORAL_TABLET | ORAL | Status: DC | PRN

## 2022-03-01 MED ORDER — TERBUTALINE SULFATE 1 MG/ML IJ SOLN: 0.2500 mg | Freq: Once | INTRAMUSCULAR | Status: DC | PRN

## 2022-03-01 MED ORDER — PHENYLEPHRINE 80 MCG/ML (10ML) SYRINGE FOR IV PUSH (FOR BLOOD PRESSURE SUPPORT)
80.0000 ug | PREFILLED_SYRINGE | INTRAVENOUS | Status: DC | PRN
  Filled 2022-03-01: qty 10

## 2022-03-01 MED ORDER — OXYTOCIN BOLUS FROM INFUSION: 333.0000 mL | Freq: Once | INTRAVENOUS | Status: DC

## 2022-03-01 MED ORDER — OXYTOCIN-SODIUM CHLORIDE 30-0.9 UT/500ML-% IV SOLN
1.0000 m[IU]/min | INTRAVENOUS | Status: DC
  Administered 2022-03-01: 2 m[IU]/min via INTRAVENOUS

## 2022-03-01 MED ORDER — BUPIVACAINE HCL (PF) 0.25 % IJ SOLN
INTRAMUSCULAR | Status: DC | PRN
  Administered 2022-03-01: 10 mL via EPIDURAL

## 2022-03-01 MED ORDER — LACTATED RINGERS IV SOLN
500.0000 mL | Freq: Once | INTRAVENOUS | Status: AC
  Administered 2022-03-01: 500 mL via INTRAVENOUS

## 2022-03-01 MED ORDER — OXYCODONE-ACETAMINOPHEN 5-325 MG PO TABS: 2.0000 | ORAL_TABLET | ORAL | Status: DC | PRN

## 2022-03-01 MED ORDER — OXYTOCIN-SODIUM CHLORIDE 30-0.9 UT/500ML-% IV SOLN
2.5000 [IU]/h | INTRAVENOUS | Status: DC
  Filled 2022-03-01: qty 500

## 2022-03-01 MED ORDER — LIDOCAINE HCL (PF) 1 % IJ SOLN: 30.0000 mL | INTRAMUSCULAR | Status: DC | PRN

## 2022-03-01 MED ORDER — FENTANYL-BUPIVACAINE-NACL 0.5-0.125-0.9 MG/250ML-% EP SOLN
12.0000 mL/h | EPIDURAL | Status: DC | PRN
  Administered 2022-03-01: 12 mL/h via EPIDURAL
  Filled 2022-03-01: qty 250

## 2022-03-01 MED ORDER — ACETAMINOPHEN 325 MG PO TABS
650.0000 mg | ORAL_TABLET | ORAL | Status: DC | PRN
  Administered 2022-03-01: 650 mg via ORAL
  Filled 2022-03-01: qty 2

## 2022-03-01 MED ORDER — SOD CITRATE-CITRIC ACID 500-334 MG/5ML PO SOLN
30.0000 mL | ORAL | Status: DC | PRN
  Administered 2022-03-01: 30 mL via ORAL
  Filled 2022-03-01: qty 30

## 2022-03-01 MED ORDER — LACTATED RINGERS IV SOLN: INTRAVENOUS | Status: DC

## 2022-03-01 MED ORDER — PHENYLEPHRINE 80 MCG/ML (10ML) SYRINGE FOR IV PUSH (FOR BLOOD PRESSURE SUPPORT)
80.0000 ug | PREFILLED_SYRINGE | INTRAVENOUS | Status: DC | PRN
  Administered 2022-03-01: 80 ug via INTRAVENOUS

## 2022-03-01 MED ORDER — LACTATED RINGERS IV SOLN: 500.0000 mL | INTRAVENOUS | Status: DC | PRN

## 2022-03-01 MED ORDER — DIPHENHYDRAMINE HCL 50 MG/ML IJ SOLN: 12.5000 mg | INTRAMUSCULAR | Status: DC | PRN

## 2022-03-01 MED ORDER — LIDOCAINE-EPINEPHRINE (PF) 2 %-1:200000 IJ SOLN
INTRAMUSCULAR | Status: DC | PRN
  Administered 2022-03-01 (×2): 5 mL via EPIDURAL

## 2022-03-01 NOTE — Progress Notes (Signed)
OB Progress Note  S: Pt comfortable after epidural   O: Today's Vitals   03/01/22 1334 03/01/22 1336 03/01/22 1339 03/01/22 1340  BP:  119/73  113/71  Pulse:  83  88  Resp:      Temp:      TempSrc:      SpO2: 99%  98%   Weight:      Height:      PainSc:       Body mass index is 47.97 kg/m.  SVE 6/100/-1, AROM clear fluid  FHR:  A: 130bpm, mod variability, + accels, no decels B: 140bpm, mod variability + accels, no decels Toco: q 9-10 mins   A/P: 21Y G2P1001 @ [redacted]w[redacted]d with DCDA twins Fetal wellbeing: cat I tracing x 2 Labor: contractions have spaced after epidural, start pitocin, AROM performed Pain control: epidural  M. Timothy Lasso, MD 03/01/22 2:06 PM

## 2022-03-01 NOTE — Lactation Note (Signed)
This note was copied from a baby's chart. Lactation Consultation Note Placed baby girl"B" in football position to BF while baby boy BF in cradle position. Baby girl has some occasional grunting. No respiratory distress or labored breathing. Baby girl has no interest at all in BF. LC attempted suck training w/gloved finger, baby girls wasn't interested. Left baby girl at breast laying face at breast.  Patient Name: Ariel Mooney [id number] Date: 03/01/2022 Reason for consult: L&D Initial assessment;Multiple gestation;Late-preterm 34-36.6wks Age:23 hours  Maternal Data Does the patient have breastfeeding experience prior to this delivery?: Yes How long did the patient breastfeed?: 1 months  Feeding    LATCH Score Latch: Too sleepy or reluctant, no latch achieved, no sucking elicited.  Audible Swallowing: None  Type of Nipple: Everted at rest and after stimulation  Comfort (Breast/Nipple): Soft / non-tender  Hold (Positioning): Full assist, staff holds infant at breast  LATCH Score: 4   Lactation Tools Discussed/Used    Interventions Interventions: Adjust position;Assisted with latch;Support pillows;Skin to skin;Position options;Breast massage;Breast compression  Discharge    Consult Status Consult Status: Follow-up from L&D Date: 03/02/22 Follow-up type: In-patient    Charyl Dancer 03/01/2022, 9:13 PM

## 2022-03-01 NOTE — Anesthesia Procedure Notes (Signed)
Epidural Patient location during procedure: OB Start time: 03/01/2022 12:45 PM End time: 03/01/2022 1:07 PM  Staffing Anesthesiologist: Lowella Curb, MD Performed: anesthesiologist   Preanesthetic Checklist Completed: patient identified, IV checked, site marked, risks and benefits discussed, surgical consent, monitors and equipment checked, pre-op evaluation and timeout performed  Epidural Patient position: sitting Prep: ChloraPrep Patient monitoring: heart rate, cardiac monitor, continuous pulse ox and blood pressure Approach: midline Location: L2-L3 Injection technique: LOR saline  Needle:  Needle type: Tuohy  Needle gauge: 17 G Needle length: 9 cm Needle insertion depth: 7 cm Catheter type: closed end flexible Catheter size: 20 Guage Catheter at skin depth: 12 cm Test dose: negative  Assessment Events: blood not aspirated, injection not painful, no injection resistance, no paresthesia and negative IV test  Additional Notes Reason for block:procedure for pain

## 2022-03-01 NOTE — Anesthesia Preprocedure Evaluation (Signed)
Anesthesia Evaluation  Patient identified by MRN, date of birth, ID band Patient awake    Reviewed: Allergy & Precautions, H&P , NPO status , Patient's Chart, lab work & pertinent test results, reviewed documented beta blocker date and time   Airway Mallampati: II  TM Distance: >3 FB Neck ROM: full    Dental no notable dental hx. (+) Teeth Intact, Dental Advisory Given   Pulmonary asthma ,    Pulmonary exam normal breath sounds clear to auscultation       Cardiovascular negative cardio ROS Normal cardiovascular exam Rhythm:regular Rate:Normal     Neuro/Psych negative neurological ROS  negative psych ROS   GI/Hepatic negative GI ROS, Neg liver ROS,   Endo/Other  Morbid obesity  Renal/GU negative Renal ROS  negative genitourinary   Musculoskeletal   Abdominal (+) + obese,   Peds  Hematology negative hematology ROS (+)   Anesthesia Other Findings   Reproductive/Obstetrics (+) Pregnancy                             Anesthesia Physical  Anesthesia Plan  ASA: III  Anesthesia Plan: Epidural   Post-op Pain Management:    Induction:   PONV Risk Score and Plan:   Airway Management Planned:   Additional Equipment:   Intra-op Plan:   Post-operative Plan:   Informed Consent: I have reviewed the patients History and Physical, chart, labs and discussed the procedure including the risks, benefits and alternatives for the proposed anesthesia with the patient or authorized representative who has indicated his/her understanding and acceptance.     Dental Advisory Given  Plan Discussed with:   Anesthesia Plan Comments: (Labs checked- platelets confirmed with RN in room. Fetal heart tracing, per RN, reported to be stable enough for sitting procedure. Discussed epidural, and patient consents to the procedure:  included risk of possible headache,backache, failed block, allergic reaction, and  nerve injury. This patient was asked if she had any questions or concerns before the procedure started.)        Anesthesia Quick Evaluation

## 2022-03-01 NOTE — MAU Note (Signed)
.  Ariel Mooney is a 21 y.o. at [redacted]w[redacted]d here in MAU reporting: possible SROM with clear fluid and CTX since 0200. Pt denies VB, abnormal discharge, DFM x2, PIH s/s, recent intercourse, and complications in the pregnancy.  DiDi Twins, Vertex x2  GBS neg SVE 3/50-2 last week Onset of complaint: 0200 Pain score: 9/10 Vitals:   03/01/22 0458  BP: 126/73  Pulse: 99  Resp: 16  Temp: 97.8 F (36.6 C)  SpO2: 98%     FHT:160/120 Lab orders placed from triage:  fern

## 2022-03-01 NOTE — Lactation Note (Signed)
This note was copied from a baby's chart. Lactation Consultation Note  Patient Name: Ariel Mooney [id number] Date: 03/01/2022 Reason for consult: L&D Initial assessment;Late-preterm 34-36.6wks;Multiple gestation Age:23 hours FOB has been holding baby STS and mom holding [name] STS. LC switched out babies and placed baby boy STS and got him to latch and after multiple tries he stopped tongue thrusting nipple out and started BF well. Breast tissue soft after feeding. No swallows heard.  Mom stated her now 1 yr old didn't BF well. She had trouble latching him so she BF/formula fed. Mom stated she plans to do a lot of pumping and bottle feeding the twins.   Maternal Data Does the patient have breastfeeding experience prior to this delivery?: Yes How long did the patient breastfeed?: 1 month  Feeding    LATCH Score Latch: Repeated attempts needed to sustain latch, nipple held in mouth throughout feeding, stimulation needed to elicit sucking reflex.  Audible Swallowing: None  Type of Nipple: Everted at rest and after stimulation  Comfort (Breast/Nipple): Soft / non-tender  Hold (Positioning): Full assist, staff holds infant at breast  LATCH Score: 5   Lactation Tools Discussed/Used    Interventions Interventions: Adjust position;Assisted with latch;Support pillows;Skin to skin;Position options;Breast massage;Breast compression  Discharge    Consult Status Consult Status: Follow-up from L&D Date: 03/02/22 Follow-up type: In-patient    Charyl Dancer 03/01/2022, 9:04 PM

## 2022-03-01 NOTE — Progress Notes (Addendum)
OR B set up as double set up for Twins to Push and deliver expectant Vaginal delivery. Initial count: Gari Crown ST And Sherol Dade, RN

## 2022-03-01 NOTE — H&P (Addendum)
Ariel Mooney is a 21 y.o. female G54P1001 [redacted]w[redacted]d with DCDA twins presented to MAU this morning with complaint of contractions and LOF. ROM check was negative. In MAU, exam was initially 4/70/-2 and progressed to 5.5/80/-2 (office exam was 3/50/-2). Good FM x 2 and no VB.    She reports discomfort with contractions, but not quite at the point where she is ready for an epidural.   Pregnancy c/b: DCDA twin pregnancy: growth scan yesterday with MFM, baby A 97% (8lb1oz = 3658g), vertex, and baby B 81% (7lb4oz = 3294g), 10% discordance, vertex. MF recommended delivery between 37 and 38 weeks.  Baby A placenta noted to have velamentous cord insertion Obesity; initial BMI 44 Cholelithiasis: has follow up with General surge[facility]  OB History     Gravida  2   Para  1   Term  1   Preterm      AB      Living  1      SAB      IAB      Ectopic      Multiple  0   Live Births  1          Past Medical History:  Diagnosis Date   Abdominal pain, recurrent    Asthma    since high school not good control   Blood transfusion without reported diagnosis    Gall stones    Intrinsic atopic dermatitis 02/09/2021   Past Surgical History:  Procedure Laterality Date   TONSILLECTOMY  08/20/2008   WISDOM TOOTH EXTRACTION     Family History: family history includes Asthma in her brother, brother, brother, maternal grandmother, mother, sister, sister, and sister; Cholelithiasis in her maternal grandmother and sister; Diabetes in her mother; Hypothyroidism in her mother; Ulcers in her maternal grandmother. Social History:  reports that she has never smoked. She has never used smokeless tobacco. She reports that she does not drink alcohol and does not use drugs.     Maternal Diabetes: No Genetic Screening: Normal Maternal Ultrasounds/Referrals: Normal Fetal Ultrasounds or other Referrals:  Referred to Materal Fetal Medicine  followed by MFM for serial growth [place] Maternal Substance Abuse:   No Significant Maternal Medications:  None Significant Maternal Lab Results:  Group B Strep negative Other Comments:  None  Review of Systems Per HPI Exam Physical Exam  Dilation: 6 Effacement (%): 80 Station: -1 Exam by:: Tirth Cothron, MD Blood pressure 113/71, pulse 88, temperature 97.9 F (36.6 C), temperature source Oral, resp. rate 16, height 5\' 1"  (1.549 m), weight 115.2 kg, last menstrual period 06/18/2021, SpO2 98 %, unknown if currently breastfeeding. Gen: NAD, appears uncomfortable with contractions CVS: normal pulses Lungs: nonlabored respirations Abd: Gravid abdomen  Fetal testing:  A FHR 130 bpm, mod variability, + accels, no decels B FHR 150bpm, mod variability, + accels, no decels Toco: ctx q 2-4 mins  Prenatal labs: ABO, Rh:  --/--/O POS (07/13 0913) Antibody: NEG (07/13 0913) Rubella: Immune (01/13 0000) RPR: NON REACTIVE (07/13 0913)  HBsAg: Negative (01/13 0000)  HIV: Non-reactive (01/13 0000)  GBS: Negative/-- (07/05 0000)   Assessment/Plan: 21Y G2P1001 @ [redacted]w[redacted]d, DCDA twins, labor Fetal wellbeing: cat I tracing x 2 Labor: progressing spontaneously, plan for AROM after epidural DCDA twins: vertex/vertex, monitor positions. Growth scan yesterday shows baby A slightly larger (8lb 1 oz vs 7lb 4oz). Discussed delivery with patient, including possibility vaginal delivery of both babies, vaginal delivery of baby A and need for cesarean of baby B, or cesarean  for both. We discussed possibility of position change, breech extraction of baby B. She states understanding and wishes to proceed with vaginal delivery. Pelvis proven to 8lb9oz (pt reports 3rd degree tear). Pain control: epidural upon patient request.     Charlett Nose 03/01/2022, 2:14 PM

## 2022-03-01 NOTE — MAU Provider Note (Signed)
Chief Complaint:  Labor Eval, Contractions, and Rupture of Membranes   Event Date/Time   First Provider Initiated Contact with Patient 03/01/22 0710      HPI: Ariel Mooney is a 21 y.o. G2P1001 at [redacted]w[redacted]d with di/di twins pregnancy who presents to maternity admissions reporting leaking of fluid that started on the toilet but persisted after she went to the bathroom. The fluid is clear, and would soak a pantyliner but not require a pad.  There are associated contractions that are gradually becoming stronger since onset. She reports good fetal movement, x 2 and denies vaginal bleeding.   HPI  Past Medical History: Past Medical History:  Diagnosis Date   Abdominal pain, recurrent    Asthma    since high school not good control   Blood transfusion without reported diagnosis    Gall stones    Intrinsic atopic dermatitis 02/09/2021    Past obstetric history: OB History  Gravida Para Term Preterm AB Living  2 1 1     1   SAB IAB Ectopic Multiple Live Births        0 1    # Outcome Date GA Lbr Len/2nd Weight Sex Delivery Anes PTL Lv  2 Current           1 Term 11/25/19 [redacted]w[redacted]d 02:40 / 01:40 3905 g M Vag-Spont EPI  LIV    Past Surgical History: Past Surgical History:  Procedure Laterality Date   TONSILLECTOMY  08/20/2008   WISDOM TOOTH EXTRACTION      Family History: Family History  Problem Relation Age of Onset   Asthma Sister    Asthma Brother    Cholelithiasis Sister    Asthma Sister    Asthma Sister    Asthma Brother    Asthma Brother    Asthma Mother    Diabetes Mother    Hypothyroidism Mother    Ulcers Maternal Grandmother    Cholelithiasis Maternal Grandmother    Asthma Maternal Grandmother    Allergic rhinitis Neg Hx    Angioedema Neg Hx    Eczema Neg Hx    Immunodeficiency Neg Hx    Urticaria Neg Hx     Social History: Social History   Tobacco Use   Smoking status: Never   Smokeless tobacco: Never  Vaping Use   Vaping Use: Never used   Substance Use Topics   Alcohol use: No   Drug use: No    Allergies:  Allergies  Allergen Reactions   Lac Bovis Hives   Lactose Intolerance (Gi) Hives and Shortness Of Breath   Latex Shortness Of Breath and Rash    Meds:  Medications Prior to Admission  Medication Sig Dispense Refill Last Dose   aspirin EC 81 MG tablet Take 81 mg by mouth daily. Swallow whole.      mometasone-formoterol (DULERA) 200-5 MCG/ACT AERO Inhale 2 puffs into the lungs 2 (two) times daily. 13 g 5    montelukast (SINGULAIR) 10 MG tablet Take 1 tablet (10 mg total) by mouth at bedtime. 30 tablet 5    Prenatal Vit-Fe Fumarate-FA (PRENATAL VITAMINS PO) Take by mouth.      Tiotropium Bromide Monohydrate (SPIRIVA RESPIMAT) 1.25 MCG/ACT AERS Inhale 2 puffs once a day to prevent cough or wheeze. 4 g 5    VENTOLIN HFA 108 (90 Base) MCG/ACT inhaler INHALE 2 PUFFS INTO THE LUNGS EVERY 4 HOURS AS NEEDED FOR WHEEZING OR SHORTNESS OF BREATH 18 g 0     ROS:  Review  of Systems  Constitutional:  Negative for chills, fatigue and fever.  Eyes:  Negative for visual disturbance.  Respiratory:  Negative for shortness of breath.   Cardiovascular:  Negative for chest pain.  Gastrointestinal:  Positive for abdominal pain. Negative for nausea and vomiting.  Genitourinary:  Positive for vaginal discharge. Negative for difficulty urinating, dysuria, flank pain, pelvic pain, vaginal bleeding and vaginal pain.  Neurological:  Negative for dizziness and headaches.  Psychiatric/Behavioral: Negative.       I have reviewed patient's Past Medical Hx, Surgical Hx, Family Hx, Social Hx, medications and allergies.   Physical Exam  Patient Vitals for the past 24 hrs:  BP Temp Temp src Pulse Resp SpO2 Height Weight  03/01/22 0500 -- -- -- -- -- 98 % -- --  03/01/22 0458 126/73 97.8 F (36.6 C) Oral 99 16 98 % 5\' 1"  (1.549 m) 115.2 kg   Constitutional: Well-developed, well-nourished female in no acute distress.  Cardiovascular: normal  rate Respiratory: normal effort GI: Abd soft, non-tender, gravid appropriate for gestational age.  MS: Extremities nontender, no edema, normal ROM Neurologic: Alert and oriented x 4.  GU: Neg CVAT.  PELVIC EXAM: Cervix pink, visually closed, without lesion, scant white creamy discharge, vaginal walls and external genitalia normal Bimanual exam: Cervix 0/long/high, firm, anterior, neg CMT, uterus nontender, nonenlarged, adnexa without tenderness, enlargement, or mass  Dilation: 5 Effacement (%): 70, 80 Station: -2 Presentation: Vertex Exam by:: [email or web address], CNM  FHT:  Baby A: Baseline 135 , moderate variability, accelerations present, no decelerations Baby B:  Baseline 140 , moderate variability, accelerations present, no decelerations Contractions: q 2-4 mins, mild to moderate to palpation   Labs: No results found for this or any previous visit (from the past 24 hour(s)).    Imaging:   MAU Course/MDM: Orders Placed This Encounter  Procedures   Contraction - monitoring   External fetal heart monitoring   Vaginal exam   POCT fern test    No orders of the defined types were placed in this encounter.    NST reviewed and reactive x 2 No evidence of ROM with negative pooling and ferning Cervix changed from 4-5 cm in 2+ hours in MAU Likely early labor Called Dr Sharen Counter with option admit to L&D, recheck in MAU in 1 hour, or admit for observation to HROB Unit. Plan to recheck in 1 hour, call Dr Timothy Lasso RN to call Dr Timothy Lasso with recheck given no ROM on CNM evaluation   Assessment: 1. Encounter for suspected premature rupture of amniotic membranes, with rupture of membranes not found   2. [redacted] weeks gestation of pregnancy   3. Threatened labor at term     Plan: RN to recheck cervix and call Dr Timothy Lasso Certified Nurse-Midwife 03/01/2022 8:00 AM

## 2022-03-01 NOTE — Transfer of Care (Signed)
Immediate Anesthesia Transfer of Care Note  Patient: Ariel Mooney  Procedure(s) Performed: AN AD HOC LABOR EPIDURAL  Patient Location: PACU and Mother/Baby  Anesthesia Type:Epidural   observation  Level of Consciousness: awake and alert   Airway & Oxygen Therapy: Patient Spontanous Breathing  Post-op Assessment: Report given to RN  Post vital signs: Reviewed and stable  Last Vitals:  Vitals Value Taken Time  BP    Temp    Pulse    Resp    SpO2      Last Pain:  Vitals:   03/01/22 1740  TempSrc: Oral  PainSc: 9          Complications: No notable events documented.

## 2022-03-02 ENCOUNTER — Encounter (HOSPITAL_COMMUNITY): Payer: Self-pay | Admitting: Obstetrics and Gynecology

## 2022-03-02 LAB — CBC
HCT: 32.1 % — ABNORMAL LOW (ref 36.0–46.0)
Hemoglobin: 10.8 g/dL — ABNORMAL LOW (ref 12.0–15.0)
MCH: 30.6 pg (ref 26.0–34.0)
MCHC: 33.6 g/dL (ref 30.0–36.0)
MCV: 90.9 fL (ref 80.0–100.0)
Platelets: 153 10*3/uL (ref 150–400)
RBC: 3.53 MIL/uL — ABNORMAL LOW (ref 3.87–5.11)
RDW: 14.6 % (ref 11.5–15.5)
WBC: 12.8 10*3/uL — ABNORMAL HIGH (ref 4.0–10.5)
nRBC: 0 % (ref 0.0–0.2)

## 2022-03-02 MED ORDER — METHYLERGONOVINE MALEATE 0.2 MG PO TABS: 0.2000 mg | ORAL_TABLET | ORAL | Status: DC | PRN

## 2022-03-02 MED ORDER — MAGNESIUM HYDROXIDE 400 MG/5ML PO SUSP: 30.0000 mL | ORAL | Status: DC | PRN

## 2022-03-02 MED ORDER — SODIUM CHLORIDE 0.9 % IV SOLN: 250.0000 mL | INTRAVENOUS | Status: DC | PRN

## 2022-03-02 MED ORDER — FERROUS SULFATE 325 (65 FE) MG PO TABS
325.0000 mg | ORAL_TABLET | Freq: Two times a day (BID) | ORAL | Status: DC
  Administered 2022-03-02 – 2022-03-03 (×4): 325 mg via ORAL
  Filled 2022-03-02 (×4): qty 1

## 2022-03-02 MED ORDER — OXYCODONE-ACETAMINOPHEN 5-325 MG PO TABS
2.0000 | ORAL_TABLET | ORAL | Status: DC | PRN
  Administered 2022-03-02: 2 via ORAL
  Filled 2022-03-02: qty 2

## 2022-03-02 MED ORDER — SIMETHICONE 80 MG PO CHEW
80.0000 mg | CHEWABLE_TABLET | ORAL | Status: DC | PRN
  Administered 2022-03-03: 80 mg via ORAL

## 2022-03-02 MED ORDER — IBUPROFEN 800 MG PO TABS
800.0000 mg | ORAL_TABLET | Freq: Three times a day (TID) | ORAL | Status: DC
Start: 2022-03-02 — End: 2022-03-03
  Administered 2022-03-02 – 2022-03-03 (×3): 800 mg via ORAL
  Filled 2022-03-02 (×5): qty 1

## 2022-03-02 MED ORDER — MOMETASONE FURO-FORMOTEROL FUM 200-5 MCG/ACT IN AERO
2.0000 | INHALATION_SPRAY | Freq: Two times a day (BID) | RESPIRATORY_TRACT | Status: DC
  Administered 2022-03-02 – 2022-03-03 (×3): 2 via RESPIRATORY_TRACT
  Filled 2022-03-02: qty 8.8

## 2022-03-02 MED ORDER — SENNOSIDES-DOCUSATE SODIUM 8.6-50 MG PO TABS
2.0000 | ORAL_TABLET | Freq: Every day | ORAL | Status: DC
  Administered 2022-03-02 – 2022-03-03 (×2): 2 via ORAL
  Filled 2022-03-02 (×3): qty 2

## 2022-03-02 MED ORDER — ALBUTEROL SULFATE (2.5 MG/3ML) 0.083% IN NEBU
3.0000 mL | INHALATION_SOLUTION | RESPIRATORY_TRACT | Status: DC | PRN
Start: 2022-03-02 — End: 2022-03-03

## 2022-03-02 MED ORDER — DIPHENHYDRAMINE HCL 25 MG PO CAPS: 25.0000 mg | ORAL_CAPSULE | Freq: Four times a day (QID) | ORAL | Status: DC | PRN

## 2022-03-02 MED ORDER — SODIUM CHLORIDE 0.9% FLUSH: 3.0000 mL | INTRAVENOUS | Status: DC | PRN

## 2022-03-02 MED ORDER — METHYLERGONOVINE MALEATE 0.2 MG/ML IJ SOLN: 0.2000 mg | INTRAMUSCULAR | Status: DC | PRN

## 2022-03-02 MED ORDER — COCONUT OIL OIL: 1.0000 | TOPICAL_OIL | Status: DC | PRN

## 2022-03-02 MED ORDER — ACETAMINOPHEN 325 MG PO TABS
650.0000 mg | ORAL_TABLET | ORAL | Status: DC | PRN
  Administered 2022-03-02 – 2022-03-03 (×2): 650 mg via ORAL
  Filled 2022-03-02 (×3): qty 2

## 2022-03-02 MED ORDER — TETANUS-DIPHTH-ACELL PERTUSSIS 5-2.5-18.5 LF-MCG/0.5 IM SUSY: 0.5000 mL | PREFILLED_SYRINGE | Freq: Once | INTRAMUSCULAR | Status: DC

## 2022-03-02 MED ORDER — SODIUM CHLORIDE 0.9% FLUSH: 3.0000 mL | Freq: Two times a day (BID) | INTRAVENOUS | Status: DC

## 2022-03-02 MED ORDER — DIBUCAINE (PERIANAL) 1 % EX OINT: 1.0000 | TOPICAL_OINTMENT | CUTANEOUS | Status: DC | PRN

## 2022-03-02 MED ORDER — BENZOCAINE-MENTHOL 20-0.5 % EX AERO
1.0000 | INHALATION_SPRAY | CUTANEOUS | Status: DC | PRN
  Filled 2022-03-02: qty 56

## 2022-03-02 MED ORDER — ONDANSETRON HCL 4 MG/2ML IJ SOLN: 4.0000 mg | INTRAMUSCULAR | Status: DC | PRN

## 2022-03-02 MED ORDER — PRENATAL MULTIVITAMIN CH
1.0000 | ORAL_TABLET | Freq: Every day | ORAL | Status: DC
  Administered 2022-03-03: 1 via ORAL
  Filled 2022-03-02: qty 1

## 2022-03-02 MED ORDER — WITCH HAZEL-GLYCERIN EX PADS: 1.0000 | MEDICATED_PAD | CUTANEOUS | Status: DC | PRN

## 2022-03-02 MED ORDER — ZOLPIDEM TARTRATE 5 MG PO TABS: 5.0000 mg | ORAL_TABLET | Freq: Every evening | ORAL | Status: DC | PRN

## 2022-03-02 MED ORDER — MONTELUKAST SODIUM 10 MG PO TABS
10.0000 mg | ORAL_TABLET | Freq: Every day | ORAL | Status: DC
  Administered 2022-03-02: 10 mg via ORAL
  Filled 2022-03-02: qty 1

## 2022-03-02 MED ORDER — ONDANSETRON HCL 4 MG PO TABS: 4.0000 mg | ORAL_TABLET | ORAL | Status: DC | PRN

## 2022-03-02 NOTE — Anesthesia Postprocedure Evaluation (Signed)
Anesthesia Post Note  Patient: Ariel Mooney  Procedure(s) Performed: VAGINAL DELIVERY     Patient location during evaluation: Mother Baby Anesthesia Type: Epidural Level of consciousness: awake and alert Pain management: pain level controlled Vital Signs Assessment: post-procedure vital signs reviewed and stable Respiratory status: spontaneous breathing, nonlabored ventilation and respiratory function stable Cardiovascular status: stable Postop Assessment: no headache, no backache, epidural receding, no apparent nausea or vomiting, patient able to bend at knees, adequate PO intake and able to ambulate Anesthetic complications: no   No notable events documented.  Last Vitals:  Vitals:   03/02/22 0300 03/02/22 0725  BP: 124/80 105/61  Pulse: (!) 109 93  Resp: 18 19  Temp: 36.8 C 36.8 C  SpO2: 100% 100%    Last Pain:  Vitals:   03/02/22 0725  TempSrc: Oral  PainSc: 3    Pain Goal:                   Laban Emperor

## 2022-03-02 NOTE — Anesthesia Postprocedure Evaluation (Signed)
Anesthesia Post Note  Patient: Ariel Mooney  Procedure(s) Performed: VAGINAL DELIVERY     Patient location during evaluation: PACU Anesthesia Type: Epidural Level of consciousness: awake and alert and oriented Pain management: pain level controlled Vital Signs Assessment: post-procedure vital signs reviewed and stable Respiratory status: spontaneous breathing, nonlabored ventilation and respiratory function stable Cardiovascular status: blood pressure returned to baseline and stable Postop Assessment: no apparent nausea or vomiting, epidural receding, no headache, no backache and patient able to bend at knees Anesthetic complications: no   No notable events documented.                 Joella Saefong A.

## 2022-03-02 NOTE — Progress Notes (Signed)
Patient is eating, ambulating, voiding.  Pain control is good.  Vitals:   03/01/22 2240 03/01/22 2333 03/01/22 2334 03/02/22 0300  BP: 140/82 133/73 126/69 124/80  Pulse: 79 (!) 110 (!) 111 (!) 109  Resp: 18 18 18 18   Temp: 98.5 F (36.9 C) 98.5 F (36.9 C) 98.1 F (36.7 C) 98.2 F (36.8 C)  TempSrc: Oral Oral Oral Oral  SpO2: 100% 98% 99% 100%  Weight:      Height:        Fundus firm Perineum without swelling.  Lab Results  Component Value Date   WBC 12.8 (H) 03/02/2022   HGB 10.8 (L) 03/02/2022   HCT 32.1 (L) 03/02/2022   MCV 90.9 03/02/2022   PLT 153 03/02/2022    --/--/O POS (07/13 0913)/RI  A/P Post partum day 1 Twin delivery.  Routine care.  Expect d/c tomorrow.    06-12-1971

## 2022-03-02 NOTE — Lactation Note (Addendum)
This note was copied from a baby's chart. Lactation Consultation Note  Patient Name: Cady Hafen [id number] Date: 03/02/2022 Reason for consult: Initial assessment;Late-preterm 34-36.6wks;Multiple gestation Age:23 hours  Twins [redacted]w[redacted]d.  Mother holding Baby Boy STS. Twins recently had formula feeding. Reviewed LPI feeding plan and volume guidelines. Set up DEBP and recommend pumping q 3 hours or when able due to mother alone in room with twins. Recommend offering breast before formula. Mother states 24 flanges are comfortable but recommend increasing to 27 flanges if rubbing is noted. Give volume pumped with the difference with formula.   Maternal Data Has patient been taught Hand Expression?: Yes Does the patient have breastfeeding experience prior to this delivery?: Yes  Feeding Mother's Current Feeding Choice: Breast Milk and Formula Nipple Type: Slow - flow   Lactation Tools Discussed/Used Tools: Pump;Flanges Flange Size: 24;27 Breast pump type: Double-Electric Breast Pump;Manual Pump Education: Setup, frequency, and cleaning;Milk Storage Reason for Pumping: stimulation and supplementation Pumping frequency: q 3 hours or when able  Interventions Interventions: DEBP;Education;LC Services brochure;LPT handout/interventions  Discharge Pump: DEBP;Hands Free;Personal (Spectra and Mom Cozi)  Consult Status Consult Status: Follow-up Date: 03/03/22 Follow-up type: In-patient    Dahlia Byes [facility] 03/02/2022, 9:09 AM

## 2022-03-02 NOTE — Lactation Note (Signed)
This note was copied from a baby's chart. Lactation Consultation Note Spoke w/RN about mom and feeding d/t everyone sleeping. LC placed DEBP, LC papers, and supplementation sheets for breast and giving formula. Mom has been formula feeding baby girl because she wasn't interested in BF. Baby boy has latched to breast. Mom will be seen by Lactation today.  Patient Name: Ariel Mooney LOVFI'[id number] Date: 03/02/2022   Age:23 hours  Maternal Data    Feeding    LATCH Score                    Lactation Tools Discussed/Used    Interventions    Discharge    Consult Status      Charyl Dancer 03/02/2022, 2:31 AM

## 2022-03-02 NOTE — Progress Notes (Signed)
Parents desire circ, unable to do am for preterm and in transition.

## 2022-03-03 ENCOUNTER — Ambulatory Visit: Payer: Self-pay

## 2022-03-03 MED ORDER — LIDOCAINE 1% INJECTION FOR CIRCUMCISION: 0.8000 mL | INJECTION | Freq: Once | INTRAVENOUS | Status: DC

## 2022-03-03 MED ORDER — IBUPROFEN 800 MG PO TABS: 800.0000 mg | ORAL_TABLET | Freq: Three times a day (TID) | ORAL | 0 refills | Status: DC

## 2022-03-03 MED ORDER — EPINEPHRINE TOPICAL FOR CIRCUMCISION 0.1 MG/ML: 1.0000 [drp] | TOPICAL | Status: DC | PRN

## 2022-03-03 MED ORDER — WHITE PETROLATUM EX OINT: 1.0000 | TOPICAL_OINTMENT | CUTANEOUS | Status: DC | PRN

## 2022-03-03 MED ORDER — SUCROSE 24% NICU/PEDS ORAL SOLUTION: 0.5000 mL | OROMUCOSAL | Status: DC | PRN

## 2022-03-03 NOTE — Discharge Summary (Signed)
Postpartum Discharge Summary  Date of Service updated     Patient Name: Ariel Mooney DOB: [birth year] MRN: [id number]  Date of admission: 03/01/2022 Delivery date:   Anorah, Trias [[id number]]  03/01/2022    Jolyn, Deshmukh [[id number]]  03/01/2022  Delivering provider:    Melida, [place] [[id number]]  HORVATH, MICHELLE    Rainelle, Sulewski [place] [[id number]]  HORVATH, MICHELLE  Date of discharge: 03/03/2022  Admitting diagnosis: Uterine contractions [O47.9] Intrauterine pregnancy: [redacted]w[redacted]d     Secondary diagnosis:  Principal Problem:   Uterine contractions  Additional problems: obestiy, DIDI-TIUP    Discharge diagnosis: Preterm Pregnancy Delivered and di-di-TIUP                                               Post partum procedures: none Augmentation: AROM Complications: None  Hospital course: Onset of Labor With Vaginal Delivery      21 y.o. yo [id number] at [redacted]w[redacted]d was admitted in Active Labor on 03/01/2022. Patient had an uncomplicated labor course as follows:  Membrane Rupture Time/Date:    Doratha, Mcswain [[id number]]  1:57 PM    Anjela, Cassara [place] [[id number]]  1:57 PM ,   Adelfa, Lozito [[id number]]  03/01/2022    Glynn, Freas [[id number]]  03/01/2022   Delivery Method:   Henrietta Hoover [[id number]]  Vaginal, Spontaneous    Takeesha, Isley [facility] [[id number]]  Vaginal, Spontaneous  Episiotomy:    Majorie, [place] [[id number]]  None    Delories, Mauri [place] [[id number]]  None  Lacerations:     Susi, Goslin [[id number]]  None    Nasra, Counce [[id number]]  None  Patient had an uncomplicated postpartum course.  She is ambulating, tolerating a regular diet, passing flatus, and urinating well. Patient is discharged home in stable condition on 03/03/22.  Newborn Data: Birth date:   Sumner, Boesch [[id number]]  03/01/2022    Monic, Engelmann [[id number]]  03/01/2022  Birth time:   Jamaya, Sleeth [[id number]]  7:13 PM    Mistie, Adney [[id number]]  7:40 PM  Gender:   Chessa, Barrasso [[id number]]  Female    Kieley, Akter [place] [[id number]]  Female  Living status:   Mlissa, Tamayo [[id number]]  Living    Candy, Leverett [place] [[id number]]  Living  Apgars:   Ginelle, Bays [[id number]]  8    Emonni, Depasquale [place] [[id number]]  8 ,   Charelle, Petrakis [[id number]]  9    Koby, [place]field [place] [[id number]]  8  Weight:   Kynslei, Art [[id number]]  3340 g    Ryin, Schillo [[id number]]  3290 g     Physical exam  Vitals:   03/02/22 0725 03/02/22 1205 03/02/22 2210 03/03/22 0610  BP: 105/61 112/74 118/80 121/78  Pulse: 93 90 79 82  Resp: 19 18 18 17   Temp: 98.3 F (36.8 C) 98.3 F (36.8 C) 98.5 F (36.9 C) 98.2 F (36.8 C)  TempSrc: Oral Oral Oral Oral  SpO2: 100% 98% 100% 99%  Weight:      Height:       General: alert, cooperative, and no distress Lochia: appropriate Uterine Fundus: firm Incision: N/A DVT Evaluation: No evidence of DVT seen on physical exam. Negative Homan's sign. No cords or calf tenderness. Labs: Lab Results  Component Value Date   WBC  12.8 (H) 03/02/2022   HGB 10.8 (L) 03/02/2022   HCT 32.1 (L) 03/02/2022   MCV 90.9 03/02/2022   PLT 153 03/02/2022      Latest Ref Rng & Units 10/22/2021    3:22 AM  CMP  Glucose 70 - 99 mg/dL 253   BUN 6 - 20 mg/dL 9   Creatinine 6.64 - 4.03 mg/dL 4.74   Sodium 259 - 563 mmol/L 135   Potassium 3.5 - 5.1 mmol/L 3.7   Chloride 98 - 111 mmol/L 104   CO2 22 - 32 mmol/L 23   Calcium 8.9 - 10.3 mg/dL 8.9   Total Protein 6.5 - 8.1 g/dL 6.2   Total Bilirubin 0.3 - 1.2 mg/dL 0.3   Alkaline Phos 38 - 126 U/L 64   AST 15 - 41 U/L 20   ALT 0 - 44 U/L 22    Edinburgh Score:    03/02/2022    7:25 AM  Edinburgh Postnatal Depression Scale Screening Tool   I have been able to laugh and see the funny side of things. 0  I have looked forward with enjoyment to things. 0  I have blamed myself unnecessarily when things went wrong. 0  I have been anxious or worried for no good reason. 0  I have felt scared or panicky for no good reason. 0  Things have been getting on top of me. 0  I have been so unhappy that I have had difficulty sleeping. 0  I have felt sad or miserable. 0  I have been so unhappy that I have been crying. 0  The thought of harming myself has occurred to me. 0  Edinburgh Postnatal Depression Scale Total 0      After visit meds:  Allergies as of 03/03/2022       Reactions   Lactose Intolerance (gi) Hives, Shortness Of Breath   Latex Shortness Of Breath, Rash   Milk (cow) Hives        Medication List     STOP taking these medications    aspirin EC 81 MG tablet       TAKE these medications    Dulera 200-5 MCG/ACT Aero Generic drug: mometasone-formoterol Inhale 2 puffs into the lungs 2 (two) times daily.   ibuprofen 800 MG tablet Commonly known as: ADVIL Take 1 tablet (800 mg total) by mouth 3 (three) times daily.   montelukast 10 MG tablet Commonly known as: SINGULAIR Take 1 tablet (10 mg total) by mouth at bedtime.   PRENATAL VITAMINS PO Take by mouth.   Spiriva Respimat 1.25 MCG/ACT Aers Generic drug: Tiotropium Bromide Monohydrate Inhale 2 puffs once a day to prevent cough or wheeze.   Ventolin HFA 108 (90 Base) MCG/ACT inhaler Generic drug: albuterol INHALE 2 PUFFS INTO THE LUNGS EVERY 4 HOURS AS NEEDED FOR WHEEZING OR SHORTNESS OF BREATH         Discharge home in stable condition Infant Feeding: Breast Infant Disposition:home with mother Discharge instruction: per After Visit Summary and Postpartum booklet. Activity: Advance as tolerated. Pelvic rest for 6 weeks.  Diet: routine diet Anticipated Birth Control: Unsure Postpartum Appointment:6 weeks F?u in 6wks with [facility]      03/03/2022 Carlisle Cater, MD

## 2022-03-03 NOTE — Lactation Note (Signed)
This note was copied from a baby's chart. Lactation Consultation Note  Patient Name: Lahari Suttles [id number] Date: 03/03/2022 Reason for consult: Follow-up assessment;Late-preterm 34-36.6wks Age:23 hours Per mom, twins (multiples ) are now taking 30 mls per feeding. Per mom, her feeding choice is "Pumping Only" and formula feeding  her twins.  Twins had a SLP consult and family will continue to follow SLP feeding guidelines. LC discussed the importance of using the DEBP to help stimulate and establish her milk supply since she chooses not to latch twins at the breast.  Mom will start pumping every 3 hours for 15 minutes on initial setting. Mom knows to offer twins and EBM first, if colostrum is express she can finger feed on gums until she has large volume, then can bottle feed to infant's.  Mom knows to call RN/LC if she has anymore breastfeeding questions or concerns.  Maternal Data    Feeding Mother's Current Feeding Choice: Breast Milk and Formula Nipple Type: Dr. Lorne Skeens  LATCH Score                    Lactation Tools Discussed/Used    Interventions Interventions: Skin to skin;Education;DEBP;LPT handout/interventions  Discharge    Consult Status Consult Status: Follow-up Date: 03/04/22 Follow-up type: In-patient    Danelle Earthly 03/03/2022, 7:08 PM

## 2022-03-03 NOTE — Progress Notes (Signed)
Patient is eating, ambulating, voiding.  Pain control is good.  Vitals:   03/02/22 0725 03/02/22 1205 03/02/22 2210 03/03/22 0610  BP: 105/61 112/74 118/80 121/78  Pulse: 93 90 79 82  Resp: 19 18 18 17   Temp: 98.3 F (36.8 C) 98.3 F (36.8 C) 98.5 F (36.9 C) 98.2 F (36.8 C)  TempSrc: Oral Oral Oral Oral  SpO2: 100% 98% 100% 99%  Weight:      Height:        Fundus firm Perineum without swelling.  Lab Results  Component Value Date   WBC 12.8 (H) 03/02/2022   HGB 10.8 (L) 03/02/2022   HCT 32.1 (L) 03/02/2022   MCV 90.9 03/02/2022   PLT 153 03/02/2022    --/--/O POS (07/13 0913)/RI  A/P Post partum day 2 Twin delivery.  Routine care.  Expect d/c today. Baby boy s/p circ this AM    Ariel Mooney

## 2022-03-04 ENCOUNTER — Ambulatory Visit: Payer: Self-pay

## 2022-03-04 NOTE — Lactation Note (Signed)
This note was copied from a baby's chart. Lactation Consultation Note  Patient Name: Ariel Mooney [id number] Date: 03/04/2022 Reason for consult: Follow-up assessment;1st time breastfeeding;Multiple gestation;Hyperbilirubinemia;Late-preterm 34-36.6wks Age:23 hours  LC in to visit with P3 Mom of LPT twins.  Twin A is at 4% weight loss and Twin B is at 6% weight loss and both are under phototherapy.    Mom is exclusively supplementing babies with formula using a Dr. Irving Burton and Preemie nipples.    Mom has not pumped since earlier this am.  Mom states she has been pumping one side at a time as one side wasn't creating suction.  Pump exchanged as it looked like one of the domes was inverted.  Assisted Mom to double pump and provided a hand's free pumping top to secure pump parts.  Reviewed cleaning protocol and importance of disassembling pump parts.  Plan recommended- 1- Once phototherapy DC'd, increase STS with babies. 2- Offer breast with feeding cues 3- Feed babies, waking if asleep at 3 hrs per volume guidelines 4-Pump both breasts on initiation setting  Mom will ask for help prn.   Lactation Tools Discussed/Used Tools: Pump;Flanges;Bottle;Hands-free pumping top Flange Size: 24 Breast pump type: Double-Electric Breast Pump Pump Education: Setup, frequency, and cleaning;Milk Storage Reason for Pumping: support milk supply/twin LPTI/hyperbilirubinemia Pumping frequency: Encouraged pumping every 3 hrs Pumped volume: 0 mL  Interventions Interventions: Breast feeding basics reviewed;Skin to skin;Breast massage;Hand express;DEBP;Education  Discharge Discharge Education: Engorgement and breast care Pump: Personal;DEBP;Hands Free Chiropractor and Momcozy)  Consult Status Consult Status: Follow-up Date: 03/05/22 Follow-up type: In-patient    Ariel Mooney 03/04/2022, 3:26 PM

## 2022-03-05 LAB — SURGICAL PATHOLOGY

## 2022-03-07 ENCOUNTER — Inpatient Hospital Stay (HOSPITAL_COMMUNITY)
Admission: AD | Admit: 2022-03-07 | Payer: Medicaid Other | Source: Home / Self Care | Admitting: Obstetrics and Gynecology

## 2022-03-07 ENCOUNTER — Inpatient Hospital Stay (HOSPITAL_COMMUNITY): Payer: Medicaid Other

## 2022-03-10 ENCOUNTER — Telehealth (HOSPITAL_COMMUNITY): Payer: Self-pay

## 2022-03-10 NOTE — Telephone Encounter (Signed)
Patient reports feeling good. Patient declines questions/concerns about her health and healing.  Patient reports that baby's are doing good. Eating, peeing/pooping, and gaining weight well. The baby's sleep in a bassinet . RN reviewed ABC's of safe sleep with patient. Patient declines any questions or concerns about baby.  EPDS score is 0.  Marcelino Duster [facility]  03/10/22,1158

## 2022-03-12 ENCOUNTER — Encounter (HOSPITAL_COMMUNITY): Payer: Self-pay | Admitting: Obstetrics and Gynecology

## 2022-03-21 ENCOUNTER — Encounter (HOSPITAL_BASED_OUTPATIENT_CLINIC_OR_DEPARTMENT_OTHER): Payer: Self-pay

## 2022-03-21 ENCOUNTER — Emergency Department (HOSPITAL_BASED_OUTPATIENT_CLINIC_OR_DEPARTMENT_OTHER)
Admission: EM | Admit: 2022-03-21 | Discharge: 2022-03-22 | Disposition: A | Discharge status: Home / Self Care | Payer: Medicaid Other | Attending: Emergency Medicine | Admitting: Emergency Medicine

## 2022-03-21 DIAGNOSIS — Z7951 Long term (current) use of inhaled steroids: Secondary | ICD-10-CM | POA: Insufficient documentation

## 2022-03-21 DIAGNOSIS — K8020 Calculus of gallbladder without cholecystitis without obstruction: Secondary | ICD-10-CM | POA: Insufficient documentation

## 2022-03-21 DIAGNOSIS — Z91040 Latex allergy status: Secondary | ICD-10-CM | POA: Insufficient documentation

## 2022-03-21 DIAGNOSIS — K8050 Calculus of bile duct without cholangitis or cholecystitis without obstruction: Secondary | ICD-10-CM

## 2022-03-21 DIAGNOSIS — R7989 Other specified abnormal findings of blood chemistry: Secondary | ICD-10-CM | POA: Diagnosis not present

## 2022-03-21 DIAGNOSIS — J45909 Unspecified asthma, uncomplicated: Secondary | ICD-10-CM | POA: Diagnosis not present

## 2022-03-21 DIAGNOSIS — R1011 Right upper quadrant pain: Secondary | ICD-10-CM | POA: Diagnosis present

## 2022-03-21 LAB — CBC
HCT: 46.1 % — ABNORMAL HIGH (ref 36.0–46.0)
Hemoglobin: 15.1 g/dL — ABNORMAL HIGH (ref 12.0–15.0)
MCH: 30.1 pg (ref 26.0–34.0)
MCHC: 32.8 g/dL (ref 30.0–36.0)
MCV: 91.8 fL (ref 80.0–100.0)
Platelets: 389 10*3/uL (ref 150–400)
RBC: 5.02 MIL/uL (ref 3.87–5.11)
RDW: 13.7 % (ref 11.5–15.5)
WBC: 16.7 10*3/uL — ABNORMAL HIGH (ref 4.0–10.5)
nRBC: 0 % (ref 0.0–0.2)

## 2022-03-21 LAB — COMPREHENSIVE METABOLIC PANEL
ALT: 51 U/L — ABNORMAL HIGH (ref 0–44)
AST: 68 U/L — ABNORMAL HIGH (ref 15–41)
Albumin: 4.4 g/dL (ref 3.5–5.0)
Alkaline Phosphatase: 183 U/L — ABNORMAL HIGH (ref 38–126)
Anion gap: 11 (ref 5–15)
BUN: 14 mg/dL (ref 6–20)
CO2: 29 mmol/L (ref 22–32)
Calcium: 9.5 mg/dL (ref 8.9–10.3)
Chloride: 101 mmol/L (ref 98–111)
Creatinine, Ser: 0.76 mg/dL (ref 0.44–1.00)
GFR, Estimated: >60 mL/min (ref >60)
Glucose, Bld: 99 mg/dL (ref 70–99)
Potassium: 4 mmol/L (ref 3.5–5.1)
Sodium: 141 mmol/L (ref 135–145)
Total Bilirubin: 0.5 mg/dL (ref 0.3–1.2)
Total Protein: 7.1 g/dL (ref 6.5–8.1)

## 2022-03-21 LAB — LIPASE, BLOOD: Lipase: 20 U/L (ref 11–51)

## 2022-03-21 NOTE — ED Provider Notes (Signed)
[facility] EMERGENCY DEPT Provider Note   CSN: [id number] Arrival date & time: 03/21/22  2037     History  Chief Complaint  Patient presents with   Abdominal Pain   Nausea    Ariel Mooney is a 21 y.o. female.  The history is provided by the patient.  Abdominal Pain She has history of asthma, gallstones, and is 3 weeks postpartum and comes in because of episodes of right upper quadrant pain and vomiting.  She was diagnosed with gallstones in March, and was generally doing well until 1 week ago when she started having episodes of right upper quadrant pain with vomiting.  Pain does radiate into the back to her chest.  She states that she has been trying to stay on a low-fat diet.  She has had 5 episodes of pain in the last week.  She has been vomiting with each episode and has noticed a significant drop in her milk production because of decreased fluid intake and vomiting.  She denies fever or chills.  She was told that she would not be able to have surgery until she was 3 months postpartum.   Home Medications Prior to Admission medications   Medication Sig Start Date End Date Taking? Authorizing Provider  ibuprofen (ADVIL) 800 MG tablet Take 1 tablet (800 mg total) by mouth 3 (three) times daily. 03/03/22   Shivaji, Valerie Roys, MD  mometasone-formoterol (DULERA) 200-5 MCG/ACT AERO Inhale 2 puffs into the lungs 2 (two) times daily. 02/09/21   Hetty Blend, FNP  montelukast (SINGULAIR) 10 MG tablet Take 1 tablet (10 mg total) by mouth at bedtime. 02/09/21   Ambs, Norvel Richards, FNP  Prenatal Vit-Fe Fumarate-FA (PRENATAL VITAMINS PO) Take by mouth.    [provider]  Tiotropium Bromide Monohydrate (SPIRIVA RESPIMAT) 1.25 MCG/ACT AERS Inhale 2 puffs once a day to prevent cough or wheeze. 02/09/21   Ambs, Norvel Richards, FNP  VENTOLIN HFA 108 (90 Base) MCG/ACT inhaler INHALE 2 PUFFS INTO THE LUNGS EVERY 4 HOURS AS NEEDED FOR WHEEZING OR SHORTNESS OF BREATH 09/25/21   Ambs, Norvel Richards, FNP       Allergies    Lactose intolerance (gi), Latex, and Milk (cow)    Review of Systems   Review of Systems  Gastrointestinal:  Positive for abdominal pain.  All other systems reviewed and are negative.   Physical Exam Updated Vital Signs BP 113/74   Pulse 66   Temp 97.9 F (36.6 C) (Oral)   Resp 17   Ht 5\' 1"  (1.549 m)   Wt 104.3 kg   LMP 06/18/2021 (Approximate)   SpO2 99%   Breastfeeding Yes   BMI 43.46 kg/m  Physical Exam Vitals and nursing note reviewed.   21 year old female, resting comfortably and in no acute distress. Vital signs are normal. Oxygen saturation is 99%, which is normal. Head is normocephalic and atraumatic. PERRLA, EOMI. Oropharynx is clear. Neck is nontender and supple without adenopathy or JVD. Back is nontender and there is no CVA tenderness. Lungs are clear without rales, wheezes, or rhonchi. Chest is nontender. Heart has regular rate and rhythm without murmur. Abdomen is soft, flat, with mild right upper quadrant tenderness.  There is no rebound or guarding.  Murphy sign is negative. Extremities have no cyanosis or edema, full range of motion is present. Skin is warm and dry without rash. Neurologic: Mental status is normal, cranial nerves are intact, moves all extremities equally.  ED Results / Procedures / Treatments  Labs (all labs ordered are listed, but only abnormal results are displayed) Labs Reviewed  COMPREHENSIVE METABOLIC PANEL - Abnormal; Notable for the following components:      Result Value   AST 68 (*)    ALT 51 (*)    Alkaline Phosphatase 183 (*)    All other components within normal limits  CBC - Abnormal; Notable for the following components:   WBC 16.7 (*)    Hemoglobin 15.1 (*)    HCT 46.1 (*)    All other components within normal limits  LIPASE, BLOOD  URINALYSIS, ROUTINE W REFLEX MICROSCOPIC  PREGNANCY, URINE   Procedures Procedures    Medications Ordered in ED Medications  lactated ringers bolus 1,000  mL (1,000 mLs Intravenous New Bag/Given 03/22/22 0020)  ondansetron (ZOFRAN) injection 4 mg (4 mg Intravenous Given 03/22/22 0020)    ED Course/ Medical Decision Making/ A&P                           Medical Decision Making Amount and/or Complexity of Data Reviewed Labs: ordered.  Risk Prescription drug management.   Right upper quadrant pain and patient with known history of cholelithiasis.  Pain has pretty much resolved and exam is benign, this appears to be an episode of biliary colic.  Doubt peptic ulcer disease, GERD, pancreatitis.  I have reviewed and interpreted her laboratory tests and my interpretation is mild elevation of transaminases and alkaline phosphatase, moderate leukocytosis which is nonspecific.  Although she is pain-free currently, I am concerned about her decreased milk production and I have ordered IV fluids as well as IV ondansetron for nausea.  General surgery consultation is requested in light of elevated transaminases and alkaline phosphatase.  I have discussed the case with Dr. Derrell Lolling of general surgery who request patient call the surgery office for an appointment to soon as possible.  I am sending a prescription for ondansetron oral dissolving tablet for her to use as needed, return to the emergency department if she has an episode of pain which is not improving in a reasonable amount of time.  Final Clinical Impression(s) / ED Diagnoses Final diagnoses:  Biliary colic  Elevated liver function tests    Rx / DC Orders ED Discharge Orders          Ordered    ondansetron (ZOFRAN-ODT) 8 MG disintegrating tablet  Every 8 hours PRN        03/22/22 0037              Dione Booze, MD 03/22/22 [phone]

## 2022-03-21 NOTE — ED Triage Notes (Signed)
Pt c/o RUQ pain, nausea & vomiting last Thursday, Friday and today. Pt report she delivered twins on 03/01/2022.

## 2022-03-21 NOTE — ED Provider Notes (Incomplete)
[facility] EMERGENCY DEPT Provider Note   CSN: [id number] Arrival date & time: 03/21/22  2037     History {Add pertinent medical, surgical, social history, OB history to HPI:1} Chief Complaint  Patient presents with  . Abdominal Pain  . Nausea    Ariel Mooney is a 21 y.o. female.  The history is provided by the patient.  Abdominal Pain She has history of asthma, gallstones, and is 3 weeks postpartum   Home Medications Prior to Admission medications   Medication Sig Start Date End Date Taking? Authorizing Provider  ibuprofen (ADVIL) 800 MG tablet Take 1 tablet (800 mg total) by mouth 3 (three) times daily. 03/03/22   Shivaji, Valerie Roys, MD  mometasone-formoterol (DULERA) 200-5 MCG/ACT AERO Inhale 2 puffs into the lungs 2 (two) times daily. 02/09/21   Hetty Blend, FNP  montelukast (SINGULAIR) 10 MG tablet Take 1 tablet (10 mg total) by mouth at bedtime. 02/09/21   Ambs, Norvel Richards, FNP  Prenatal Vit-Fe Fumarate-FA (PRENATAL VITAMINS PO) Take by mouth.    [provider]  Tiotropium Bromide Monohydrate (SPIRIVA RESPIMAT) 1.25 MCG/ACT AERS Inhale 2 puffs once a day to prevent cough or wheeze. 02/09/21   Ambs, Norvel Richards, FNP  VENTOLIN HFA 108 (90 Base) MCG/ACT inhaler INHALE 2 PUFFS INTO THE LUNGS EVERY 4 HOURS AS NEEDED FOR WHEEZING OR SHORTNESS OF BREATH 09/25/21   Ambs, Norvel Richards, FNP      Allergies    Lactose intolerance (gi), Latex, and Milk (cow)    Review of Systems   Review of Systems  Gastrointestinal:  Positive for abdominal pain.  All other systems reviewed and are negative.   Physical Exam Updated Vital Signs BP 113/74   Pulse 66   Temp 97.9 F (36.6 C) (Oral)   Resp 17   Ht 5\' 1"  (1.549 m)   Wt 104.3 kg   LMP 06/18/2021 (Approximate)   SpO2 99%   Breastfeeding Yes   BMI 43.46 kg/m  Physical Exam Vitals and nursing note reviewed.   21 year old female, resting comfortably and in no acute distress. Vital signs are ***. Oxygen saturation is  ***%, which is normal. Head is normocephalic and atraumatic. PERRLA, EOMI. Oropharynx is clear. Neck is nontender and supple without adenopathy or JVD. Back is nontender and there is no CVA tenderness. Lungs are clear without rales, wheezes, or rhonchi. Chest is nontender. Heart has regular rate and rhythm without murmur. Abdomen is soft, flat, nontender without masses or hepatosplenomegaly and peristalsis is normoactive. Extremities have no cyanosis or edema, full range of motion is present. Skin is warm and dry without rash. Neurologic: Mental status is normal, cranial nerves are intact, there are no motor or sensory deficits.  ED Results / Procedures / Treatments   Labs (all labs ordered are listed, but only abnormal results are displayed) Labs Reviewed  COMPREHENSIVE METABOLIC PANEL - Abnormal; Notable for the following components:      Result Value   AST 68 (*)    ALT 51 (*)    Alkaline Phosphatase 183 (*)    All other components within normal limits  CBC - Abnormal; Notable for the following components:   WBC 16.7 (*)    Hemoglobin 15.1 (*)    HCT 46.1 (*)    All other components within normal limits  LIPASE, BLOOD  URINALYSIS, ROUTINE W REFLEX MICROSCOPIC  PREGNANCY, URINE    EKG None  Radiology No results found.  Procedures Procedures  {Document cardiac monitor,  telemetry assessment procedure when appropriate:1}  Medications Ordered in ED Medications - No data to display  ED Course/ Medical Decision Making/ A&P                           Medical Decision Making Amount and/or Complexity of Data Reviewed Labs: ordered.   ***  {Document critical care time when appropriate:1} {Document review of labs and clinical decision tools ie heart score, Chads2Vasc2 etc:1}  {Document your independent review of radiology images, and any outside records:1} {Document your discussion with family members, caretakers, and with consultants:1} {Document social determinants  of health affecting pt's care:1} {Document your decision making why or why not admission, treatments were needed:1} Final Clinical Impression(s) / ED Diagnoses Final diagnoses:  None    Rx / DC Orders ED Discharge Orders     None

## 2022-03-22 MED ORDER — ONDANSETRON 8 MG PO TBDP: 8.0000 mg | ORAL_TABLET | Freq: Three times a day (TID) | ORAL | 0 refills | Status: DC | PRN

## 2022-03-22 MED ORDER — LACTATED RINGERS IV BOLUS
1000.0000 mL | Freq: Once | INTRAVENOUS | Status: AC
  Administered 2022-03-22: 1000 mL via INTRAVENOUS

## 2022-03-22 MED ORDER — ONDANSETRON HCL 4 MG/2ML IJ SOLN
4.0000 mg | Freq: Once | INTRAMUSCULAR | Status: AC
  Administered 2022-03-22: 4 mg via INTRAVENOUS
  Filled 2022-03-22: qty 2

## 2022-03-22 NOTE — ED Notes (Signed)
Pt unable to urinate.

## 2022-03-22 NOTE — Discharge Instructions (Signed)
You have gallstones which are not going to go away.  You will need to have your gallbladder removed.  I recommend it be removed as soon as possible to prevent more episodes like what you are having tonight.  Please call the general surgery office today for an appointment as soon as possible.  If you have an episode of pain which is not resolving in a reasonable amount of time, then return to the emergency department for further evaluation.

## 2022-03-23 ENCOUNTER — Ambulatory Visit: Payer: Self-pay | Admitting: General Surgery

## 2022-03-23 NOTE — H&P (View-Only) (Signed)
Chief Complaint: New Consultation (Gallbladder)       History of Present Illness: Ariel Mooney is a 21 y.o. female who is seen today as an office consultation at the request of Dr. Antonietta Barcelona for evaluation of New Consultation (Gallbladder) .   Patient is a 21 year old female comes in secondary to abdominal pain.   Patient has been reporting abdominal pain.  States this has been going on since her pregnancy.  She was diagnosed with gallstones during her pregnancy.   She states that she was in the ER secondary to abdominal pain, vomiting.  She states lasting 30 to 60 minutes.  Patient did undergo laboratory studies which was significant for small bump in her LFTs.   She had no previous abdominal surgeries.   Review of Systems: A complete review of systems was obtained from the patient.  I have reviewed this information and discussed as appropriate with the patient.  See HPI as well for other ROS.   Review of Systems  Constitutional:  Negative for fever.  HENT:  Negative for congestion.   Eyes:  Negative for blurred vision.  Respiratory:  Negative for cough, shortness of breath and wheezing.   Cardiovascular:  Negative for chest pain and palpitations.  Gastrointestinal:  Positive for abdominal pain and nausea. Negative for heartburn.  Genitourinary:  Negative for dysuria.  Musculoskeletal:  Negative for myalgias.  Skin:  Negative for rash.  Neurological:  Negative for dizziness and headaches.  Psychiatric/Behavioral:  Negative for depression and suicidal ideas.   All other systems reviewed and are negative.      Medical History: Past Medical History Past Medical History: Diagnosis Date  Anxiety    Asthma, unspecified asthma severity, unspecified whether complicated, unspecified whether persistent        There is no problem list on file for this patient.     Past Surgical History History reviewed. No pertinent surgical history.      Allergies Allergies Allergen Reactions  Latex, Natural Rubber Hives, Rash and Shortness Of Breath  Milk Hives  Latex Hives      Current Outpatient Medications on File Prior to Visit Medication Sig Dispense Refill  prenatal vitamin with iron-folic acid (PRENATAL TABLETS) tablet Take 1 tablet by mouth once daily      SPIRIVA RESPIMAT 1.25 mcg/actuation inhalation spray Inhale 2 puffs once a day to prevent cough or wheeze.      VENTOLIN HFA 90 mcg/actuation inhaler INHALE 2 PUFFS EVERY 4 HOURS BY INHALATION ROUTE AS NEEDED       No current facility-administered medications on file prior to visit.     Family History Family History Problem Relation Age of Onset  Obesity Mother    High blood pressure (Hypertension) Mother    Diabetes Mother    Obesity Sister    Diabetes Sister        Social History   Tobacco Use Smoking Status Never Smokeless Tobacco Never     Social History Social History    Socioeconomic History  Marital status: Single Tobacco Use  Smoking status: Never  Smokeless tobacco: Never Vaping Use  Vaping Use: Never used Substance and Sexual Activity  Alcohol use: Not Currently  Drug use: Never      Objective:     Vitals:   03/23/22 1039 BP: 110/60 Pulse: 94 Temp: 36.7 C (98.1 F) SpO2: 96% Weight: 99.8 kg (220 lb) Height: 154.9 cm (5\' 1" )   Body mass index is 41.57 kg/m.   Physical Exam Constitutional:  Appearance: Normal appearance.  HENT:     Head: Normocephalic and atraumatic.     Mouth/Throat:     Mouth: Mucous membranes are moist.     Pharynx: Oropharynx is clear.  Eyes:     General: No scleral icterus.    Pupils: Pupils are equal, round, and reactive to light.  Cardiovascular:     Rate and Rhythm: Normal rate and regular rhythm.     Pulses: Normal pulses.     Heart sounds: No murmur heard.   No friction rub. No gallop.  Pulmonary:     Effort: Pulmonary effort is normal. No respiratory distress.     Breath sounds:  Normal breath sounds. No stridor.  Abdominal:     General: Abdomen is flat.  Musculoskeletal:        General: No swelling.  Skin:    General: Skin is warm.  Neurological:     General: No focal deficit present.     Mental Status: She is alert and oriented to person, place, and time. Mental status is at baseline.  Psychiatric:        Mood and Affect: Mood normal.        Thought Content: Thought content normal.        Judgment: Judgment normal.      Assessment and Plan: Diagnoses and all orders for this visit:   Symptomatic cholelithiasis     Ariel Mooney is a 21 y.o. female    1.  We will proceed to the OR for a lap cholecystectomy. 2. All risks and benefits were discussed with the patient to generally include: infection, bleeding, possible need for post op ERCP, damage to the bile ducts, and bile leak. Alternatives were offered and described.  All questions were answered and the patient voiced understanding of the procedure and wishes to proceed at this point with a laparoscopic cholecystectomy           No follow-ups on file.   Fard Borunda, MD, FACS [facility] [place] [facility]  

## 2022-03-23 NOTE — H&P (Signed)
Chief Complaint: New Consultation (Gallbladder)       History of Present Illness: Ariel Mooney is a 21 y.o. female who is seen today as an office consultation at the request of Dr. Antonietta Barcelona for evaluation of New Consultation (Gallbladder) .   Patient is a 21 year old female comes in secondary to abdominal pain.   Patient has been reporting abdominal pain.  States this has been going on since her pregnancy.  She was diagnosed with gallstones during her pregnancy.   She states that she was in the ER secondary to abdominal pain, vomiting.  She states lasting 30 to 60 minutes.  Patient did undergo laboratory studies which was significant for small bump in her LFTs.   She had no previous abdominal surgeries.   Review of Systems: A complete review of systems was obtained from the patient.  I have reviewed this information and discussed as appropriate with the patient.  See HPI as well for other ROS.   Review of Systems  Constitutional:  Negative for fever.  HENT:  Negative for congestion.   Eyes:  Negative for blurred vision.  Respiratory:  Negative for cough, shortness of breath and wheezing.   Cardiovascular:  Negative for chest pain and palpitations.  Gastrointestinal:  Positive for abdominal pain and nausea. Negative for heartburn.  Genitourinary:  Negative for dysuria.  Musculoskeletal:  Negative for myalgias.  Skin:  Negative for rash.  Neurological:  Negative for dizziness and headaches.  Psychiatric/Behavioral:  Negative for depression and suicidal ideas.   All other systems reviewed and are negative.      Medical History: Past Medical History Past Medical History: Diagnosis Date  Anxiety    Asthma, unspecified asthma severity, unspecified whether complicated, unspecified whether persistent        There is no problem list on file for this patient.     Past Surgical History History reviewed. No pertinent surgical history.      Allergies Allergies Allergen Reactions  Latex, Natural Rubber Hives, Rash and Shortness Of Breath  Milk Hives  Latex Hives      Current Outpatient Medications on File Prior to Visit Medication Sig Dispense Refill  prenatal vitamin with iron-folic acid (PRENATAL TABLETS) tablet Take 1 tablet by mouth once daily      SPIRIVA RESPIMAT 1.25 mcg/actuation inhalation spray Inhale 2 puffs once a day to prevent cough or wheeze.      VENTOLIN HFA 90 mcg/actuation inhaler INHALE 2 PUFFS EVERY 4 HOURS BY INHALATION ROUTE AS NEEDED       No current facility-administered medications on file prior to visit.     Family History Family History Problem Relation Age of Onset  Obesity Mother    High blood pressure (Hypertension) Mother    Diabetes Mother    Obesity Sister    Diabetes Sister        Social History   Tobacco Use Smoking Status Never Smokeless Tobacco Never     Social History Social History    Socioeconomic History  Marital status: Single Tobacco Use  Smoking status: Never  Smokeless tobacco: Never Vaping Use  Vaping Use: Never used Substance and Sexual Activity  Alcohol use: Not Currently  Drug use: Never      Objective:     Vitals:   03/23/22 1039 BP: 110/60 Pulse: 94 Temp: 36.7 C (98.1 F) SpO2: 96% Weight: 99.8 kg (220 lb) Height: 154.9 cm (5\' 1" )   Body mass index is 41.57 kg/m.   Physical Exam Constitutional:  Appearance: Normal appearance.  HENT:     Head: Normocephalic and atraumatic.     Mouth/Throat:     Mouth: Mucous membranes are moist.     Pharynx: Oropharynx is clear.  Eyes:     General: No scleral icterus.    Pupils: Pupils are equal, round, and reactive to light.  Cardiovascular:     Rate and Rhythm: Normal rate and regular rhythm.     Pulses: Normal pulses.     Heart sounds: No murmur heard.   No friction rub. No gallop.  Pulmonary:     Effort: Pulmonary effort is normal. No respiratory distress.     Breath sounds:  Normal breath sounds. No stridor.  Abdominal:     General: Abdomen is flat.  Musculoskeletal:        General: No swelling.  Skin:    General: Skin is warm.  Neurological:     General: No focal deficit present.     Mental Status: She is alert and oriented to person, place, and time. Mental status is at baseline.  Psychiatric:        Mood and Affect: Mood normal.        Thought Content: Thought content normal.        Judgment: Judgment normal.      Assessment and Plan: Diagnoses and all orders for this visit:   Symptomatic cholelithiasis     Ariel Mooney is a 21 y.o. female    1.  We will proceed to the OR for a lap cholecystectomy. 2. All risks and benefits were discussed with the patient to generally include: infection, bleeding, possible need for post op ERCP, damage to the bile ducts, and bile leak. Alternatives were offered and described.  All questions were answered and the patient voiced understanding of the procedure and wishes to proceed at this point with a laparoscopic cholecystectomy           No follow-ups on file.   Axel Filler, MD, [facility], [place] [facility] Minimally Invasive Surgery

## 2022-03-27 ENCOUNTER — Encounter (HOSPITAL_COMMUNITY): Payer: Self-pay | Admitting: General Surgery

## 2022-03-27 NOTE — Anesthesia Preprocedure Evaluation (Addendum)
Anesthesia Evaluation  Patient identified by MRN, date of birth, ID band Patient awake    Reviewed: Allergy & Precautions, NPO status , Patient's Chart, lab work & pertinent test results  Airway Mallampati: II  TM Distance: >3 FB Neck ROM: Full    Dental no notable dental hx. (+) Teeth Intact, Dental Advisory Given   Pulmonary shortness of breath and with exertion, asthma ,    Pulmonary exam normal breath sounds clear to auscultation       Cardiovascular negative cardio ROS Normal cardiovascular exam Rhythm:Regular Rate:Normal     Neuro/Psych negative neurological ROS  negative psych ROS   GI/Hepatic Neg liver ROS, Cholelithiasis- symptomatic   Endo/Other  Morbid obesity  Renal/GU negative Renal ROS  negative genitourinary   Musculoskeletal negative musculoskeletal ROS (+)   Abdominal (+) + obese,   Peds  Hematology negative hematology ROS (+)   Anesthesia Other Findings   Reproductive/Obstetrics Lactating Hx/o twin delivery 4 weeks ago                            Anesthesia Physical Anesthesia Plan  ASA: 3  Anesthesia Plan: General   Post-op Pain Management: Ofirmev IV (intra-op)* and Precedex   Induction: Intravenous, Rapid sequence and Cricoid pressure planned  PONV Risk Score and Plan: 4 or greater and Treatment may vary due to age or medical condition, Ondansetron and Dexamethasone  Airway Management Planned: Oral ETT  Additional Equipment: None  Intra-op Plan:   Post-operative Plan: Extubation in OR  Informed Consent: I have reviewed the patients History and Physical, chart, labs and discussed the procedure including the risks, benefits and alternatives for the proposed anesthesia with the patient or authorized representative who has indicated his/her understanding and acceptance.     Dental advisory given  Plan Discussed with: CRNA and  Anesthesiologist  Anesthesia Plan Comments: (PAT note written 03/27/2022 by Shonna Chock, PA-C. 21 year old with asthma, will be 4 weeks post-partum vaginal delivery of twins ([redacted]w[redacted]d) on 03/29/22. She was diagnosed with cholelithiasis in March 2023.ED visit 03/21/22 for RUQ abdominal pain with N/V. Labs showed mild elevation in transaminases and moderate leukocytosis. She was given IVF, Zofran, and seen for out-patient general surgery consultation 8/4 23 with cholecystectomy recommended.     )      Anesthesia Quick Evaluation

## 2022-03-27 NOTE — Progress Notes (Signed)
Pt and spouse did not answer after multiple call attempts. LVM with pre-op instructions to arrive at 0630, surgery [phone]. Nothing to eat after midnight, clears liquids until 0530. If breast feeding, she will need to pump and dump the milk for 24 hours after the surgery.

## 2022-03-27 NOTE — Progress Notes (Signed)
Anesthesia Chart Review: Maury Dus  Case: [id number] Date/Time: 03/28/22 0815   Procedure: LAPAROSCOPIC CHOLECYSTECTOMY   Anesthesia type: General   Pre-op diagnosis: GALLSTONES   [facility]: [facility]OM [facility] / [facility]   Surgeons: Axel Filler, MD       DISCUSSION: Patient is a 21 year old female scheduled for the above procedure. She is will be 4 weeks post-partum vaginal delivery of twins ([redacted]w[redacted]d) on 03/29/22. She was diagnosed with cholelithiasis in March 2023. She had an ED visit on 03/21/22 for RUQ abdominal pain with N/V. She reported significant drop in breast milk production as well due to decreased intake.Labs showed mild elevation in transaminases and moderate leukocytosis. She was given IVF, Zofran, and general surgery called with out-patient consultation done on 03/23/22 with cholecystectomy recommended.    Other history includes never smoker, asthma (on Dulera, Singulair, Spiriva, as needed albuterol HFA).  Anesthesia team to evaluate on the day of surgery.     VS: LMP 06/18/2021 (Approximate)  BP Readings from Last 3 Encounters:  03/22/22 (!) 112/97  03/03/22 121/78  02/28/22 123/73   Pulse Readings from Last 3 Encounters:  03/22/22 71  03/03/22 82  02/28/22 (!) 101     PROVIDERS: [name], NP is PCP    LABS: Most recent lab results during 03/21/22 ED visit showed  WBC 16.7, newly elevated AST 68 and ALT 51. Given leukocytosis and newly elevated LFTs would plan for repeat CBC and CMET unless deferred for assigned anesthesiologist and/or surgeon. Lab Results  Component Value Date   WBC 16.7 (H) 03/21/2022   HGB 15.1 (H) 03/21/2022   HCT 46.1 (H) 03/21/2022   PLT 389 03/21/2022   GLUCOSE 99 03/21/2022   ALT 51 (H) 03/21/2022   AST 68 (H) 03/21/2022   NA 141 03/21/2022   K 4.0 03/21/2022   CL 101 03/21/2022   CREATININE 0.76 03/21/2022   BUN 14 03/21/2022   CO2 29 03/21/2022    IMAGES: [place] Abd 10/22/21: IMPRESSION: Cholelithiasis without acute  cholecystitis.    EKG: N/A. EKG from 01/11/16 showed ST. Per Result Narrative in [facility], she had an EKG on 04/22/20 that showed ST at 109, possible LAE, right BBB.   CV: N/A  Past Medical History:  Diagnosis Date   Abdominal pain, recurrent    Asthma    since high school not good control   Blood transfusion without reported diagnosis    Gall stones    Intrinsic atopic dermatitis 02/09/2021    Past Surgical History:  Procedure Laterality Date   TONSILLECTOMY  08/20/2008   VAGINAL DELIVERY N/A 03/01/2022   Procedure: VAGINAL DELIVERY;  Surgeon: Carrington Clamp, MD;  Location: [facility] LD ORS;  Service: Obstetrics;  Laterality: N/A;   WISDOM TOOTH EXTRACTION      MEDICATIONS: No current facility-administered medications for this encounter.    mometasone-formoterol (DULERA) 200-5 MCG/ACT AERO   montelukast (SINGULAIR) 10 MG tablet   Tiotropium Bromide Monohydrate (SPIRIVA RESPIMAT) 1.25 MCG/ACT AERS   VENTOLIN HFA 108 (90 Base) MCG/ACT inhaler   ibuprofen (ADVIL) 800 MG tablet   ondansetron (ZOFRAN-ODT) 8 MG disintegrating tablet    Shonna Chock, PA-C Surgical Short Stay/Anesthesiology [facility] Phone [phone] [facility] Phone [phone] 03/27/2022 9:18 AM

## 2022-03-28 ENCOUNTER — Ambulatory Visit (HOSPITAL_COMMUNITY): Payer: Medicaid Other | Admitting: Vascular Surgery

## 2022-03-28 ENCOUNTER — Encounter (HOSPITAL_COMMUNITY)
Admission: RE | Disposition: A | Discharge status: Home / Self Care | Payer: Self-pay | Source: Home / Self Care | Attending: General Surgery

## 2022-03-28 ENCOUNTER — Ambulatory Visit (HOSPITAL_COMMUNITY)
Admission: RE | Admit: 2022-03-28 | Discharge: 2022-03-28 | Disposition: A | Discharge status: Home / Self Care | Payer: Medicaid Other | Attending: General Surgery | Admitting: General Surgery

## 2022-03-28 ENCOUNTER — Other Ambulatory Visit: Payer: Self-pay

## 2022-03-28 ENCOUNTER — Ambulatory Visit (HOSPITAL_BASED_OUTPATIENT_CLINIC_OR_DEPARTMENT_OTHER): Payer: Medicaid Other | Admitting: Vascular Surgery

## 2022-03-28 ENCOUNTER — Encounter (HOSPITAL_COMMUNITY): Payer: Self-pay | Admitting: General Surgery

## 2022-03-28 DIAGNOSIS — J45909 Unspecified asthma, uncomplicated: Secondary | ICD-10-CM | POA: Insufficient documentation

## 2022-03-28 DIAGNOSIS — K8020 Calculus of gallbladder without cholecystitis without obstruction: Secondary | ICD-10-CM | POA: Diagnosis not present

## 2022-03-28 DIAGNOSIS — O9963 Diseases of the digestive system complicating the puerperium: Secondary | ICD-10-CM | POA: Diagnosis not present

## 2022-03-28 DIAGNOSIS — Z6841 Body Mass Index (BMI) 40.0 and over, adult: Secondary | ICD-10-CM | POA: Insufficient documentation

## 2022-03-28 DIAGNOSIS — K8012 Calculus of gallbladder with acute and chronic cholecystitis without obstruction: Secondary | ICD-10-CM | POA: Insufficient documentation

## 2022-03-28 HISTORY — PX: CHOLECYSTECTOMY: SHX55

## 2022-03-28 LAB — CBC
HCT: 44.6 % (ref 36.0–46.0)
Hemoglobin: 14.5 g/dL (ref 12.0–15.0)
MCH: 30.7 pg (ref 26.0–34.0)
MCHC: 32.5 g/dL (ref 30.0–36.0)
MCV: 94.3 fL (ref 80.0–100.0)
Platelets: 307 10*3/uL (ref 150–400)
RBC: 4.73 MIL/uL (ref 3.87–5.11)
RDW: 13.6 % (ref 11.5–15.5)
WBC: 10.6 10*3/uL — ABNORMAL HIGH (ref 4.0–10.5)
nRBC: 0 % (ref 0.0–0.2)

## 2022-03-28 LAB — COMPREHENSIVE METABOLIC PANEL
ALT: 40 U/L (ref 0–44)
AST: 27 U/L (ref 15–41)
Albumin: 3.5 g/dL (ref 3.5–5.0)
Alkaline Phosphatase: 141 U/L — ABNORMAL HIGH (ref 38–126)
Anion gap: 9 (ref 5–15)
BUN: 11 mg/dL (ref 6–20)
CO2: 23 mmol/L (ref 22–32)
Calcium: 9 mg/dL (ref 8.9–10.3)
Chloride: 107 mmol/L (ref 98–111)
Creatinine, Ser: 0.64 mg/dL (ref 0.44–1.00)
GFR, Estimated: >60 mL/min (ref >60)
Glucose, Bld: 103 mg/dL — ABNORMAL HIGH (ref 70–99)
Potassium: 3.7 mmol/L (ref 3.5–5.1)
Sodium: 139 mmol/L (ref 135–145)
Total Bilirubin: 0.3 mg/dL (ref 0.3–1.2)
Total Protein: 6.5 g/dL (ref 6.5–8.1)

## 2022-03-28 SURGERY — LAPAROSCOPIC CHOLECYSTECTOMY
Anesthesia: General | Site: Abdomen

## 2022-03-28 MED ORDER — 0.9 % SODIUM CHLORIDE (POUR BTL) OPTIME
TOPICAL | Status: DC | PRN
  Administered 2022-03-28: 1000 mL

## 2022-03-28 MED ORDER — DEXAMETHASONE SODIUM PHOSPHATE 10 MG/ML IJ SOLN
INTRAMUSCULAR | Status: AC
  Filled 2022-03-28: qty 1

## 2022-03-28 MED ORDER — ONDANSETRON HCL 4 MG/2ML IJ SOLN
INTRAMUSCULAR | Status: AC
  Filled 2022-03-28: qty 2

## 2022-03-28 MED ORDER — SUCCINYLCHOLINE CHLORIDE 200 MG/10ML IV SOSY
PREFILLED_SYRINGE | INTRAVENOUS | Status: AC
  Filled 2022-03-28: qty 10

## 2022-03-28 MED ORDER — BUPIVACAINE HCL (PF) 0.25 % IJ SOLN
INTRAMUSCULAR | Status: AC
  Filled 2022-03-28: qty 30

## 2022-03-28 MED ORDER — ACETAMINOPHEN 500 MG PO TABS
1000.0000 mg | ORAL_TABLET | ORAL | Status: AC
  Administered 2022-03-28: 1000 mg via ORAL
  Filled 2022-03-28: qty 2

## 2022-03-28 MED ORDER — ROCURONIUM BROMIDE 10 MG/ML (PF) SYRINGE
PREFILLED_SYRINGE | INTRAVENOUS | Status: AC
  Filled 2022-03-28: qty 10

## 2022-03-28 MED ORDER — PROPOFOL 10 MG/ML IV BOLUS
INTRAVENOUS | Status: DC | PRN
  Administered 2022-03-28: 180 mg via INTRAVENOUS

## 2022-03-28 MED ORDER — PHENYLEPHRINE 80 MCG/ML (10ML) SYRINGE FOR IV PUSH (FOR BLOOD PRESSURE SUPPORT)
PREFILLED_SYRINGE | INTRAVENOUS | Status: AC
  Filled 2022-03-28: qty 10

## 2022-03-28 MED ORDER — HYDROMORPHONE HCL 1 MG/ML IJ SOLN
0.2500 mg | INTRAMUSCULAR | Status: DC | PRN
  Administered 2022-03-28: 0.25 mg via INTRAVENOUS

## 2022-03-28 MED ORDER — LIDOCAINE 2% (20 MG/ML) 5 ML SYRINGE
INTRAMUSCULAR | Status: AC
Start: 2022-03-28 — End: ?
  Filled 2022-03-28: qty 5

## 2022-03-28 MED ORDER — BUPIVACAINE-EPINEPHRINE (PF) 0.25% -1:200000 IJ SOLN
INTRAMUSCULAR | Status: AC
Start: 2022-03-28 — End: ?
  Filled 2022-03-28: qty 30

## 2022-03-28 MED ORDER — LACTATED RINGERS IV SOLN: INTRAVENOUS | Status: DC

## 2022-03-28 MED ORDER — BUPIVACAINE HCL 0.25 % IJ SOLN
INTRAMUSCULAR | Status: DC | PRN
  Administered 2022-03-28: 7 mL

## 2022-03-28 MED ORDER — HYDROMORPHONE HCL 1 MG/ML IJ SOLN
INTRAMUSCULAR | Status: AC
  Filled 2022-03-28: qty 1

## 2022-03-28 MED ORDER — OXYCODONE HCL 5 MG PO TABS: 5.0000 mg | ORAL_TABLET | Freq: Once | ORAL | Status: DC | PRN

## 2022-03-28 MED ORDER — TRAMADOL HCL 50 MG PO TABS: 50.0000 mg | ORAL_TABLET | Freq: Four times a day (QID) | ORAL | 0 refills | Status: DC | PRN

## 2022-03-28 MED ORDER — SUCCINYLCHOLINE CHLORIDE 200 MG/10ML IV SOSY
PREFILLED_SYRINGE | INTRAVENOUS | Status: DC | PRN
  Administered 2022-03-28: 120 mg via INTRAVENOUS

## 2022-03-28 MED ORDER — DEXAMETHASONE SODIUM PHOSPHATE 10 MG/ML IJ SOLN
INTRAMUSCULAR | Status: DC | PRN
  Administered 2022-03-28: 10 mg via INTRAVENOUS

## 2022-03-28 MED ORDER — MIDAZOLAM HCL 2 MG/2ML IJ SOLN
INTRAMUSCULAR | Status: DC | PRN
  Administered 2022-03-28: 2 mg via INTRAVENOUS

## 2022-03-28 MED ORDER — HYDROMORPHONE HCL 1 MG/ML IJ SOLN
INTRAMUSCULAR | Status: AC
  Filled 2022-03-28: qty 0.5

## 2022-03-28 MED ORDER — FENTANYL CITRATE (PF) 250 MCG/5ML IJ SOLN
INTRAMUSCULAR | Status: DC | PRN
  Administered 2022-03-28: 50 ug via INTRAVENOUS
  Administered 2022-03-28: 150 ug via INTRAVENOUS
  Administered 2022-03-28: 50 ug via INTRAVENOUS

## 2022-03-28 MED ORDER — AMISULPRIDE (ANTIEMETIC) 5 MG/2ML IV SOLN
INTRAVENOUS | Status: AC
  Filled 2022-03-28: qty 4

## 2022-03-28 MED ORDER — ROCURONIUM BROMIDE 10 MG/ML (PF) SYRINGE
PREFILLED_SYRINGE | INTRAVENOUS | Status: DC | PRN
  Administered 2022-03-28: 50 mg via INTRAVENOUS

## 2022-03-28 MED ORDER — LIDOCAINE 2% (20 MG/ML) 5 ML SYRINGE
INTRAMUSCULAR | Status: DC | PRN
  Administered 2022-03-28: 60 mg via INTRAVENOUS

## 2022-03-28 MED ORDER — EPHEDRINE 5 MG/ML INJ
INTRAVENOUS | Status: AC
  Filled 2022-03-28: qty 5

## 2022-03-28 MED ORDER — ONDANSETRON HCL 4 MG/2ML IJ SOLN: 4.0000 mg | Freq: Once | INTRAMUSCULAR | Status: DC | PRN

## 2022-03-28 MED ORDER — HYDROMORPHONE HCL 1 MG/ML IJ SOLN
INTRAMUSCULAR | Status: DC | PRN
  Administered 2022-03-28: .5 mg via INTRAVENOUS

## 2022-03-28 MED ORDER — SODIUM CHLORIDE 0.9 % IR SOLN
Status: DC | PRN
  Administered 2022-03-28: 1000 mL

## 2022-03-28 MED ORDER — SCOPOLAMINE 1 MG/3DAYS TD PT72
MEDICATED_PATCH | TRANSDERMAL | Status: AC
  Filled 2022-03-28: qty 1

## 2022-03-28 MED ORDER — PROPOFOL 10 MG/ML IV BOLUS
INTRAVENOUS | Status: AC
  Filled 2022-03-28: qty 20

## 2022-03-28 MED ORDER — ONDANSETRON HCL 4 MG/2ML IJ SOLN
INTRAMUSCULAR | Status: DC | PRN
  Administered 2022-03-28: 4 mg via INTRAVENOUS

## 2022-03-28 MED ORDER — CHLORHEXIDINE GLUCONATE 0.12 % MT SOLN
15.0000 mL | Freq: Once | OROMUCOSAL | Status: AC
  Administered 2022-03-28: 15 mL via OROMUCOSAL
  Filled 2022-03-28: qty 15

## 2022-03-28 MED ORDER — CEFAZOLIN SODIUM-DEXTROSE 2-4 GM/100ML-% IV SOLN
2.0000 g | INTRAVENOUS | Status: AC
  Administered 2022-03-28: 2 g via INTRAVENOUS
  Filled 2022-03-28: qty 100

## 2022-03-28 MED ORDER — CHLORHEXIDINE GLUCONATE CLOTH 2 % EX PADS: 6.0000 | MEDICATED_PAD | Freq: Once | CUTANEOUS | Status: DC

## 2022-03-28 MED ORDER — ALBUTEROL SULFATE HFA 108 (90 BASE) MCG/ACT IN AERS
INHALATION_SPRAY | RESPIRATORY_TRACT | Status: AC
  Filled 2022-03-28: qty 6.7

## 2022-03-28 MED ORDER — SUGAMMADEX SODIUM 200 MG/2ML IV SOLN
INTRAVENOUS | Status: DC | PRN
  Administered 2022-03-28: 200 mg via INTRAVENOUS

## 2022-03-28 MED ORDER — ENSURE PRE-SURGERY PO LIQD: 296.0000 mL | Freq: Once | ORAL | Status: DC

## 2022-03-28 MED ORDER — MIDAZOLAM HCL 2 MG/2ML IJ SOLN
INTRAMUSCULAR | Status: AC
  Filled 2022-03-28: qty 2

## 2022-03-28 MED ORDER — AMISULPRIDE (ANTIEMETIC) 5 MG/2ML IV SOLN
10.0000 mg | Freq: Once | INTRAVENOUS | Status: AC
Start: 2022-03-28 — End: 2022-03-28
  Administered 2022-03-28: 10 mg via INTRAVENOUS

## 2022-03-28 MED ORDER — OXYCODONE HCL 5 MG/5ML PO SOLN: 5.0000 mg | Freq: Once | ORAL | Status: DC | PRN

## 2022-03-28 MED ORDER — DEXMEDETOMIDINE (PRECEDEX) IN NS 20 MCG/5ML (4 MCG/ML) IV SYRINGE
PREFILLED_SYRINGE | INTRAVENOUS | Status: DC | PRN
  Administered 2022-03-28 (×3): 4 ug via INTRAVENOUS

## 2022-03-28 MED ORDER — FENTANYL CITRATE (PF) 250 MCG/5ML IJ SOLN
INTRAMUSCULAR | Status: AC
  Filled 2022-03-28: qty 5

## 2022-03-28 MED ORDER — ORAL CARE MOUTH RINSE: 15.0000 mL | Freq: Once | OROMUCOSAL | Status: AC

## 2022-03-28 SURGICAL SUPPLY — 45 items
BAG COUNTER SPONGE SURGICOUNT (BAG) ×2 IMPLANT
CANISTER SUCT 3000ML PPV (MISCELLANEOUS) ×2 IMPLANT
CHLORAPREP W/TINT 26 (MISCELLANEOUS) ×2 IMPLANT
CLIP LIGATING HEMO O LOK GREEN (MISCELLANEOUS) ×3 IMPLANT
COVER SURGICAL LIGHT HANDLE (MISCELLANEOUS) ×2 IMPLANT
COVER TRANSDUCER ULTRASND (DRAPES) ×1 IMPLANT
DERMABOND ×1 IMPLANT
DERMABOND ADVANCED (GAUZE/BANDAGES/DRESSINGS) ×1
DERMABOND ADVANCED .7 DNX12 (GAUZE/BANDAGES/DRESSINGS) ×1 IMPLANT
ELECT REM PT RETURN 9FT ADLT (ELECTROSURGICAL) ×2
ELECTRODE REM PT RTRN 9FT ADLT (ELECTROSURGICAL) ×1 IMPLANT
GLOVE BIO SURGEON STRL SZ7.5 (GLOVE) ×2 IMPLANT
GLOVE BIOGEL PI IND STRL 7.0 (GLOVE) IMPLANT
GLOVE BIOGEL PI INDICATOR 7.0 (GLOVE) ×1
GLOVE SURG SYN 7.5  E (GLOVE) ×1
GLOVE SURG SYN 7.5 E (GLOVE) ×1 IMPLANT
GLOVE SURG SYN 7.5 PF PI (GLOVE) ×1 IMPLANT
GOWN STRL REUS W/ TWL LRG LVL3 (GOWN DISPOSABLE) ×2 IMPLANT
GOWN STRL REUS W/ TWL XL LVL3 (GOWN DISPOSABLE) ×1 IMPLANT
GOWN STRL REUS W/TWL LRG LVL3 (GOWN DISPOSABLE) ×4
GOWN STRL REUS W/TWL XL LVL3 (GOWN DISPOSABLE) ×2
GRASPER SUT TROCAR 14GX15 (MISCELLANEOUS) ×2 IMPLANT
KIT BASIN OR (CUSTOM PROCEDURE TRAY) ×2 IMPLANT
KIT TURNOVER KIT B (KITS) ×2 IMPLANT
NDL INSUFFLATION 14GA 120MM (NEEDLE) ×1 IMPLANT
NEEDLE INSUFFLATION 14GA 120MM (NEEDLE) ×2 IMPLANT
NS IRRIG 1000ML POUR BTL (IV SOLUTION) ×2 IMPLANT
PAD ARMBOARD 7.5X6 YLW CONV (MISCELLANEOUS) ×2 IMPLANT
POUCH LAPAROSCOPIC INSTRUMENT (MISCELLANEOUS) ×2 IMPLANT
POUCH RETRIEVAL ECOSAC 10 (ENDOMECHANICALS) IMPLANT
POUCH RETRIEVAL ECOSAC 10MM (ENDOMECHANICALS) ×1
SCISSORS LAP 5X35 DISP (ENDOMECHANICALS) ×2 IMPLANT
SET IRRIG TUBING LAPAROSCOPIC (IRRIGATION / IRRIGATOR) ×2 IMPLANT
SET TUBE SMOKE EVAC HIGH FLOW (TUBING) ×2 IMPLANT
SLEEVE ENDOPATH XCEL 5M (ENDOMECHANICALS) ×2 IMPLANT
SPECIMEN JAR SMALL (MISCELLANEOUS) ×2 IMPLANT
SPIKE FLUID TRANSFER (MISCELLANEOUS) ×1 IMPLANT
SUT MNCRL AB 4-0 PS2 18 (SUTURE) ×2 IMPLANT
TOWEL GREEN STERILE (TOWEL DISPOSABLE) ×2 IMPLANT
TOWEL GREEN STERILE FF (TOWEL DISPOSABLE) ×2 IMPLANT
TRAY LAPAROSCOPIC MC (CUSTOM PROCEDURE TRAY) ×2 IMPLANT
TROCAR XCEL NON-BLD 11X100MML (ENDOMECHANICALS) ×2 IMPLANT
TROCAR Z-THREAD OPTICAL 5X100M (TROCAR) ×2 IMPLANT
WARMER LAPAROSCOPE (MISCELLANEOUS) ×1 IMPLANT
WATER STERILE IRR 1000ML POUR (IV SOLUTION) ×1 IMPLANT

## 2022-03-28 NOTE — Op Note (Signed)
03/28/2022  9:13 AM  PATIENT:  Ariel Mooney  21 y.o. female  PRE-OPERATIVE DIAGNOSIS:  GALLSTONES  POST-OPERATIVE DIAGNOSIS:  GALLSTONES  PROCEDURE:  Procedure(s): LAPAROSCOPIC CHOLECYSTECTOMY (N/A)  SURGEON:  Surgeon(s) and Role:    Axel Filler, MD - Primary  ASSISTANTS: none   ANESTHESIA:   local and general  EBL:  minimal   BLOOD ADMINISTERED:none  DRAINS: none   LOCAL MEDICATIONS USED:  BUPIVICAINE   SPECIMEN:  Source of Specimen:  gallbladder  DISPOSITION OF SPECIMEN:  PATHOLOGY  COUNTS:  YES  TOURNIQUET:  * No tourniquets in log *  DICTATION: .Dragon Dictation   EBL: <5cc   Complications: none   Counts: reported as correct x 2   Findings:chronically inflamed galllbladder  Indications for procedure: Pt is a 13F with RUQ pain and seen to have gallstones.   Details of the procedure: The patient was taken to the operating and placed in the supine position with bilateral SCDs in place. A time out was called and all facts were verified. A pneumoperitoneum was obtained via A Veress needle technique to a pressure of 46mm of mercury. A 9mm trochar was then placed in the right upper quadrant under visualization, and there were no injuries to any abdominal organs. A 11 mm port was then placed in the umbilical region after infiltrating with local anesthesia under direct visualization. A second epigastric port was placed under direct visualization.   The gallbladder was identified and retracted, the peritoneum was then sharply dissected from the gallbladder and this dissection was carried down to Calot's triangle. The cystic duct was identified and dissected circumferentially and seen going into the gallbladder 360.  The cystic artery was dissected away from the surrounding tissues.   The critical angle was obtained.    2 clips were placed proximally one distally and the cystic duct transected. The cystic artery was identified and 2 clips placed proximally  and one distally and transected. We then proceeded to remove the gallbladder off the hepatic fossa with Bovie cautery. A retrieval bag was then placed in the abdomen and gallbladder placed in the bag. The hepatic fossa was then reexamined and hemostasis was achieved with Bovie cautery and was excellent at this portion of the case. The subhepatic fossa and perihepatic fossa was then irrigated until the effluent was clear. The specimen bag and specimen were removed from the abdominal cavity.  The 11 mm trocar fascia was reapproximated with the Endo Close #1 Vicryl x2. The pneumoperitoneum was evacuated and all trochars removed under direct visulalization. The skin was then closed with 4-0 Monocryl and the skin dressed with Dermabond. The patient was awaken from general anesthesia and taken to the recovery room in stable condition.   PLAN OF CARE: Discharge to home after PACU  PATIENT DISPOSITION:  PACU - hemodynamically stable.   Delay start of Pharmacological VTE agent (>24hrs) due to surgical blood loss or risk of bleeding: not applicable

## 2022-03-28 NOTE — Transfer of Care (Signed)
Immediate Anesthesia Transfer of Care Note  Patient: Ariel Mooney  Procedure(s) Performed: LAPAROSCOPIC CHOLECYSTECTOMY (Abdomen)  Patient Location: PACU  Anesthesia Type:General  Level of Consciousness: drowsy and patient cooperative  Airway & Oxygen Therapy: Patient Spontanous Breathing  Post-op Assessment: Report given to RN and Post -op Vital signs reviewed and stable  Post vital signs: Reviewed and stable  Last Vitals:  Vitals Value Taken Time  BP 128/78 03/28/22 0930  Temp    Pulse 91 03/28/22 0930  Resp 14 03/28/22 0930  SpO2 95 % 03/28/22 0930  Vitals shown include unvalidated device data.  Last Pain:  Vitals:   03/28/22 0726  TempSrc:   PainSc: 0-No pain      Patients Stated Pain Goal: 3 (03/28/22 0726)  Complications: No notable events documented.

## 2022-03-28 NOTE — Anesthesia Procedure Notes (Signed)
Procedure Name: Intubation Date/Time: 03/28/2022 8:37 AM  Performed by: Rosiland Oz, CRNAPre-anesthesia Checklist: Patient identified, Emergency Drugs available, Suction available, Patient being monitored and Timeout performed Patient Re-evaluated:Patient Re-evaluated prior to induction Oxygen Delivery Method: Circle system utilized Preoxygenation: Pre-oxygenation with 100% oxygen Induction Type: IV induction, Rapid sequence and Cricoid Pressure applied Laryngoscope Size: Miller and 3 Grade View: Grade I Tube type: Oral Tube size: 7.0 mm Number of attempts: 1 Airway Equipment and Method: Stylet Placement Confirmation: ETT inserted through vocal cords under direct vision, positive ETCO2 and breath sounds checked- equal and bilateral Secured at: 21 cm Tube secured with: Tape Dental Injury: Teeth and Oropharynx as per pre-operative assessment

## 2022-03-28 NOTE — Interval H&P Note (Signed)
History and Physical Interval Note:  03/28/2022 7:40 AM  Ariel Mooney  has presented today for surgery, with the diagnosis of GALLSTONES.  The various methods of treatment have been discussed with the patient and family. After consideration of risks, benefits and other options for treatment, the patient has consented to  Procedure(s): LAPAROSCOPIC CHOLECYSTECTOMY (N/A) as a surgical intervention.  The patient's history has been reviewed, patient examined, no change in status, stable for surgery.  I have reviewed the patient's chart and labs.  Questions were answered to the patient's satisfaction.     Axel Filler

## 2022-03-28 NOTE — Discharge Instructions (Signed)
[facility] ______[facility] LAPAROSCOPIC SURGERY: POST OP INSTRUCTIONS Always review your discharge instruction sheet given to you by the facility where your surgery was performed. IF YOU HAVE DISABILITY OR FAMILY LEAVE FORMS, YOU MUST BRING THEM TO THE OFFICE FOR PROCESSING.   DO NOT GIVE THEM TO YOUR DOCTOR.  A prescription for pain medication may be given to you upon discharge.  Take your pain medication as prescribed, if needed.  If narcotic pain medicine is not needed, then you may take acetaminophen (Tylenol) or ibuprofen (Advil) as needed. Take your usually prescribed medications unless otherwise directed. If you need a refill on your pain medication, please contact your pharmacy.  They will contact our office to request authorization. Prescriptions will not be filled after 5pm or on week-ends. You should follow a light diet the first few days after arrival home, such as soup and crackers, etc.  Be sure to include lots of fluids daily. Most patients will experience some swelling and bruising in the area of the incisions.  Ice packs will help.  Swelling and bruising can take several days to resolve.  It is common to experience some constipation if taking pain medication after surgery.  Increasing fluid intake and taking a stool softener (such as Colace) will usually help or prevent this problem from occurring.  A mild laxative (Milk of Magnesia or Miralax) should be taken according to package instructions if there are no bowel movements after 48 hours. Unless discharge instructions indicate otherwise, you may remove your bandages 24-48 hours after surgery, and you may shower at that time.  You may have steri-strips (small skin tapes) in place directly over the incision.  These strips should be left on the skin for 7-10 days.  If your surgeon used skin glue on the incision, you may shower in 24 hours.  The glue will flake off over the next 2-3 weeks.  Any sutures or staples will be  removed at the office during your follow-up visit. ACTIVITIES:  You may resume regular (light) daily activities beginning the next day--such as daily self-care, walking, climbing stairs--gradually increasing activities as tolerated.  You may have sexual intercourse when it is comfortable.  Refrain from any heavy lifting or straining until approved by your doctor. You may drive when you are no longer taking prescription pain medication, you can comfortably wear a seatbelt, and you can safely maneuver your car and apply brakes. RETURN TO WORK:  __________________________________________________________ You should see your doctor in the office for a follow-up appointment approximately 2-3 weeks after your surgery.  Make sure that you call for this appointment within a day or two after you arrive home to insure a convenient appointment time. OTHER INSTRUCTIONS: __________________________________________________________________________________________________________________________ __________________________________________________________________________________________________________________________ WHEN TO CALL YOUR DOCTOR: Fever over 101.0 Inability to urinate Continued bleeding from incision. Increased pain, redness, or drainage from the incision. Increasing abdominal pain  The clinic staff is available to answer your questions during regular business hours.  Please don't hesitate to call and ask to speak to one of the nurses for clinical concerns.  If you have a medical emergency, go to the nearest emergency room or call 911.  A surgeon from [facility] is always on call at the hospital. [address], [address], [place], [place]  [address] ? [address] [address], [place], [place]   [address] [phone] ? [phone] ? FAX [phone] Web site: [email or web address]  

## 2022-03-28 NOTE — Anesthesia Postprocedure Evaluation (Signed)
Anesthesia Post Note  Patient: Ariel Mooney  Procedure(s) Performed: LAPAROSCOPIC CHOLECYSTECTOMY (Abdomen)     Patient location during evaluation: PACU Anesthesia Type: General Level of consciousness: awake and alert and oriented Pain management: pain level controlled Vital Signs Assessment: post-procedure vital signs reviewed and stable Respiratory status: spontaneous breathing, nonlabored ventilation, respiratory function stable and patient connected to nasal cannula oxygen Cardiovascular status: blood pressure returned to baseline and stable Anesthetic complications: no   No notable events documented.  Last Vitals:  Vitals:   03/28/22 1015 03/28/22 1030  BP: 117/79 124/84  Pulse: (!) 53 66  Resp: 13 15  Temp:  36.6 C  SpO2: 94% 97%    Last Pain:  Vitals:   03/28/22 1030  TempSrc:   PainSc: 0-No pain                 Roshaun Pound A.

## 2022-03-29 ENCOUNTER — Encounter (HOSPITAL_COMMUNITY): Payer: Self-pay | Admitting: General Surgery

## 2022-03-29 LAB — SURGICAL PATHOLOGY

## 2022-06-12 ENCOUNTER — Ambulatory Visit: Payer: Medicaid Other | Admitting: Internal Medicine

## 2022-06-27 NOTE — Patient Instructions (Incomplete)
Asthma Restart montelukast 10 mg once a day to prevent cough or wheeze Restart Dulera 200-2 puffs twice a day with a spacer to prevent cough or wheeze Restart Spiriva 1.25 mcg 2 puffs once a day to prevent cough or wheeze Continue albuterol 2 puffs once every 4 hours as needed for cough or wheeze You may use albuterol 2 puffs 5 to 15 minutes before activity to decrease cough or wheeze We will submit your information to out biologics coordinator to restart Fasenra  Allergic rhinitis Continue allergen avoidance measures directed toward pollens, pets, dust mite, mold, and cockroach as listed below Continue cetirizine 10 mg once a day as needed for runny nose or itch. Remember to rotate to a different antihistamine about every 3 months. Some examples of over the counter antihistamines include Zyrtec (cetirizine), Xyzal (levocetirizine), Allegra (fexofenadine), and Claritin (loratidine).  Continue Flonase 2 sprays in each nostril once a day as needed for a stuffy nose Consider saline nasal rinses as needed for nasal symptoms. Use this before any medicated nasal sprays for best result  Allergic conjunctivitis Some over the counter eye drops include Pataday one drop in each eye once a day as needed for red, itchy eyes OR Zaditor one drop in each eye twice a day as needed for red itchy eyes.  Atopic dermatitis Continue a daily moisturizing routine For stubborn red, itchy areas below your face, begin triamcinolone 0.1% ointment twice a day as needed.  Do not use this medication for longer than 2 weeks in a row For stubborn red itchy areas on your face, begin desonide 0.05% ointment twice a day as needed.  Do not use this medication for longer than 2 weeks in a row  Call the clinic if this treatment plan is not working well for you  Follow up in 1 month or sooner if needed.  Reducing Pollen Exposure The American Academy of Allergy, Asthma and Immunology suggests the following steps to reduce your  exposure to pollen during allergy seasons. Do not hang sheets or clothing out to dry; pollen may collect on these items. Do not mow lawns or spend time around freshly cut grass; mowing stirs up pollen. Keep windows closed at night.  Keep car windows closed while driving. Minimize morning activities outdoors, a time when pollen counts are usually at their highest. Stay indoors as much as possible when pollen counts or humidity is high and on windy days when pollen tends to remain in the air longer. Use air conditioning when possible.  Many air conditioners have filters that trap the pollen spores. Use a HEPA room air filter to remove pollen form the indoor air you breathe.  Control of Dog or Cat Allergen Avoidance is the best way to manage a dog or cat allergy. If you have a dog or cat and are allergic to dog or cats, consider removing the dog or cat from the home. If you have a dog or cat but don't want to find it a new home, or if your family wants a pet even though someone in the household is allergic, here are some strategies that may help keep symptoms at bay:  Keep the pet out of your bedroom and restrict it to only a few rooms. Be advised that keeping the dog or cat in only one room will not limit the allergens to that room. Don't pet, hug or kiss the dog or cat; if you do, wash your hands with soap and water. High-efficiency particulate air (HEPA) cleaners  run continuously in a bedroom or living room can reduce allergen levels over time. Regular use of a high-efficiency vacuum cleaner or a central vacuum can reduce allergen levels. Giving your dog or cat a bath at least once a week can reduce airborne allergen.   Control of Dust Mite Allergen Dust mites play a major role in allergic asthma and rhinitis. They occur in environments with high humidity wherever human skin is found. Dust mites absorb humidity from the atmosphere (ie, they do not drink) and feed on organic matter (including  shed human and animal skin). Dust mites are a microscopic type of insect that you cannot see with the naked eye. High levels of dust mites have been detected from mattresses, pillows, carpets, upholstered furniture, bed covers, clothes, soft toys and any woven material. The principal allergen of the dust mite is found in its feces. A gram of dust may contain 1,000 mites and 250,000 fecal particles. Mite antigen is easily measured in the air during house cleaning activities. Dust mites do not bite and do not cause harm to humans, other than by triggering allergies/asthma.  Ways to decrease your exposure to dust mites in your home:  1. Encase mattresses, box springs and pillows with a mite-impermeable barrier or cover  2. Wash sheets, blankets and drapes weekly in hot water (130 F) with detergent and dry them in a dryer on the hot setting.  3. Have the room cleaned frequently with a vacuum cleaner and a damp dust-mop. For carpeting or rugs, vacuuming with a vacuum cleaner equipped with a high-efficiency particulate air (HEPA) filter. The dust mite allergic individual should not be in a room which is being cleaned and should wait 1 hour after cleaning before going into the room.  4. Do not sleep on upholstered furniture (eg, couches).  5. If possible removing carpeting, upholstered furniture and drapery from the home is ideal. Horizontal blinds should be eliminated in the rooms where the person spends the most time (bedroom, study, television room). Washable vinyl, roller-type shades are optimal.  6. Remove all non-washable stuffed toys from the bedroom. Wash stuffed toys weekly like sheets and blankets above.  7. Reduce indoor humidity to less than 50%. Inexpensive humidity monitors can be purchased at most hardware stores. Do not use a humidifier as can make the problem worse and are not recommended.  Control of Mold Allergen Mold and fungi can grow on a variety of surfaces provided certain  temperature and moisture conditions exist.  Outdoor molds grow on plants, decaying vegetation and soil.  The major outdoor mold, Alternaria and Cladosporium, are found in very high numbers during hot and dry conditions.  Generally, a late Summer - Fall peak is seen for common outdoor fungal spores.  Rain will temporarily lower outdoor mold spore count, but counts rise rapidly when the rainy period ends.  The most important indoor molds are Aspergillus and Penicillium.  Dark, humid and poorly ventilated basements are ideal sites for mold growth.  The next most common sites of mold growth are the bathroom and the kitchen.  Outdoor [employer] Use air conditioning and keep windows closed Avoid exposure to decaying vegetation. Avoid leaf raking. Avoid grain handling. Consider wearing a face mask if working in moldy areas.  Indoor Mold Control Maintain humidity below 50%. Clean washable surfaces with 5% bleach solution. Remove sources e.g. Contaminated carpets.  Control of Mold Allergen Mold and fungi can grow on a variety of surfaces provided certain temperature and moisture conditions  exist.  Outdoor molds grow on plants, decaying vegetation and soil.  The major outdoor mold, Alternaria and Cladosporium, are found in very high numbers during hot and dry conditions.  Generally, a late Summer - Fall peak is seen for common outdoor fungal spores.  Rain will temporarily lower outdoor mold spore count, but counts rise rapidly when the rainy period ends.  The most important indoor molds are Aspergillus and Penicillium.  Dark, humid and poorly ventilated basements are ideal sites for mold growth.  The next most common sites of mold growth are the bathroom and the kitchen.  Outdoor [employer] Use air conditioning and keep windows closed Avoid exposure to decaying vegetation. Avoid leaf raking. Avoid grain handling. Consider wearing a face mask if working in moldy areas.  Indoor Mold Control Maintain  humidity below 50%. Clean washable surfaces with 5% bleach solution. Remove sources e.g. Contaminated carpets.

## 2022-06-27 NOTE — Progress Notes (Signed)
[place] [address] Dept: [phone]  FOLLOW UP NOTE  Patient ID: Ariel Mooney, female    DOB: 2001-06-16  Age: 21 y.o. MRN: [id number] Date of Office Visit: 06/28/2022  Assessment  Chief Complaint: Asthma  HPI Ariel Mooney is a 21 year old female who presents to the clinic for follow-up visit.  She was last seen in this clinic on 02/09/2021 by Gareth Morgan, FNP, for evaluation of asthma, allergic rhinitis, allergic conjunctivitis, and atopic dermatitis.  In the interim, she visited the emergency department on 06/14/2022 for asthma exacerbation at which time she was positive to RSV.  During that encounter, chest x-ray was noted to be normal.  She was provided with a prednisone taper at that time.  At today's visit, she reports that her asthma has been poorly controlled with frequent episodes of shortness of breath, wheeze occurring in the daytime and nighttime, and cough producing phlegm.  She reports poor asthma control that began in July, however, she has been out of all of her medications except albuterol which she has been using about 5 times a day for the last several weeks.  She has previously taken Dulera 200, montelukast 10 mg daily, Spiriva 1.25 mcg - 2 puffs twice a day, and has received Fasenra with relief of asthma symptoms.  Chart review indicates that on 10/22/2021 her absolute eosinophil count was 300.  Allergic rhinitis is reported as moderately well controlled with symptoms occurring in a flare in remission pattern during season changes.  She reports that she occasionally takes cetirizine with relief of symptoms.  She is not currently using Flonase or nasal saline rinses.  Her last environmental allergy testing was on 10/22/2018 via lab and was positive to grass pollen, weed pollen, tree pollen, ragweed pollen, mold, dust mite, cat, and dog.allergic conjunctivitis is reported as poorly controlled with occasional red and itchy eyes for which she is not currently  using any medical intervention.  Atopic dermatitis is reported as poorly controlled with red and itchy areas occurring mainly on her left arm.  She has used hydrocortisone 1% with no improvement of her symptoms.  Her current medications are listed in the chart.  Drug Allergies:  Allergies  Allergen Reactions   Lactose Intolerance (Gi) Hives and Shortness Of Breath   Latex Shortness Of Breath and Rash   Milk (Cow) Hives    Physical Exam: BP 112/78 (BP Location: Left Arm, Patient Position: Sitting, Cuff Size: Large)   Pulse 90   Temp 98.2 F (36.8 C) (Temporal)   Resp 17   Ht 5\' 3"  (1.6 m)   Wt 226 lb 3.2 oz (102.6 kg)   SpO2 98%   BMI 40.07 kg/m    Physical Exam Vitals reviewed.  Constitutional:      Appearance: Normal appearance.  HENT:     Head: Normocephalic and atraumatic.     Right Ear: Tympanic membrane normal.     Left Ear: Tympanic membrane normal.     Nose:     Comments: Bilateral nares edematous and pale with clear nasal drainage noted.  Pharynx normal.  Ears normal.  Eyes normal.    Mouth/Throat:     Pharynx: Oropharynx is clear.  Eyes:     Conjunctiva/sclera: Conjunctivae normal.  Cardiovascular:     Rate and Rhythm: Normal rate and regular rhythm.     Heart sounds: Normal heart sounds. No murmur heard. Pulmonary:     Effort: Pulmonary effort is normal.     Comments:  Bilateral expiratory wheeze which cleared postbronchodilator therapy Musculoskeletal:        General: Normal range of motion.     Cervical back: Normal range of motion and neck supple.  Skin:    Comments: Scattered eczematous patches noted on left forearm.  No open areas or drainage noted.  Neurological:     Mental Status: She is alert and oriented to person, place, and time.  Psychiatric:        Mood and Affect: Mood normal.        Behavior: Behavior normal.        Thought Content: Thought content normal.        Judgment: Judgment normal.    Diagnostics: FVC 3.07, FEV1 2.65.  Predicted  FVC 3.39, predicted FEV1 2.99.  Spirometry indicates normal ventilatory function.  Postbronchodilator FVC 3.20, FEV1 2.83.  Postbronchodilator spirometry indicates 7% improvement in FEV1.  Assessment and Plan: 1. Not well controlled severe persistent asthma   2. Seasonal allergic conjunctivitis   3. Seasonal and perennial allergic rhinitis   4. Intrinsic atopic dermatitis     Meds ordered this encounter  Medications   VENTOLIN HFA 108 (90 Base) MCG/ACT inhaler    Sig: INHALE 2 PUFFS INTO THE LUNGS EVERY 4 HOURS AS NEEDED FOR WHEEZING OR SHORTNESS OF BREATH    Dispense:  18 g    Refill:  1   montelukast (SINGULAIR) 10 MG tablet    Sig: Take 1 tablet (10 mg total) by mouth at bedtime.    Dispense:  30 tablet    Refill:  5   mometasone-formoterol (DULERA) 200-5 MCG/ACT AERO    Sig: Inhale 2 puffs into the lungs 2 (two) times daily.    Dispense:  13 g    Refill:  5   Tiotropium Bromide Monohydrate (SPIRIVA RESPIMAT) 1.25 MCG/ACT AERS    Sig: Inhale 2 puffs once a day to prevent cough or wheeze.    Dispense:  4 g    Refill:  5   cetirizine (ZYRTEC) 10 MG tablet    Sig: Take 1 tablet (10 mg total) by mouth daily as needed for allergies.    Dispense:  30 tablet    Refill:  5   fluticasone (FLONASE) 50 MCG/ACT nasal spray    Sig: Place 2 sprays into both nostrils daily as needed for allergies or rhinitis.    Dispense:  16 g    Refill:  3    Patient Instructions  Asthma Restart montelukast 10 mg once a day to prevent cough or wheeze Restart Dulera 200-2 puffs twice a day with a spacer to prevent cough or wheeze Restart Spiriva 1.25 mcg 2 puffs once a day to prevent cough or wheeze Continue albuterol 2 puffs once every 4 hours as needed for cough or wheeze You may use albuterol 2 puffs 5 to 15 minutes before activity to decrease cough or wheeze We will submit your information to out biologics coordinator to restart Fasenra  Allergic rhinitis Continue allergen avoidance measures  directed toward pollens, pets, dust mite, mold, and cockroach as listed below Continue cetirizine 10 mg once a day as needed for runny nose or itch. Remember to rotate to a different antihistamine about every 3 months. Some examples of over the counter antihistamines include Zyrtec (cetirizine), Xyzal (levocetirizine), Allegra (fexofenadine), and Claritin (loratidine).  Continue Flonase 2 sprays in each nostril once a day as needed for a stuffy nose Consider saline nasal rinses as needed for nasal symptoms. Use this before  any medicated nasal sprays for best result  Allergic conjunctivitis Some over the counter eye drops include Pataday one drop in each eye once a day as needed for red, itchy eyes OR Zaditor one drop in each eye twice a day as needed for red itchy eyes.  Atopic dermatitis Continue a daily moisturizing routine For stubborn red, itchy areas below your face, begin triamcinolone 0.1% ointment twice a day as needed.  Do not use this medication for longer than 2 weeks in a row For stubborn red itchy areas on your face, begin desonide 0.05% ointment twice a day as needed.  Do not use this medication for longer than 2 weeks in a row  Call the clinic if this treatment plan is not working well for you  Follow up in 1 month or sooner if needed.   Return in about 4 weeks (around 07/26/2022), or if symptoms worsen or fail to improve.    Thank you for the opportunity to care for this patient.  Please do not hesitate to contact me with questions.  Thermon Leyland, FNP [facility]

## 2022-06-28 ENCOUNTER — Ambulatory Visit (INDEPENDENT_AMBULATORY_CARE_PROVIDER_SITE_OTHER): Payer: Medicaid Other | Admitting: Family Medicine

## 2022-06-28 ENCOUNTER — Other Ambulatory Visit: Payer: Self-pay

## 2022-06-28 ENCOUNTER — Encounter: Payer: Self-pay | Admitting: Family Medicine

## 2022-06-28 VITALS — BP 112/78 | HR 90 | Temp 98.2°F | Resp 17 | Ht 63.0 in | Wt 226.2 lb

## 2022-06-28 DIAGNOSIS — H1013 Acute atopic conjunctivitis, bilateral: Secondary | ICD-10-CM

## 2022-06-28 DIAGNOSIS — J302 Other seasonal allergic rhinitis: Secondary | ICD-10-CM

## 2022-06-28 DIAGNOSIS — J3089 Other allergic rhinitis: Secondary | ICD-10-CM

## 2022-06-28 DIAGNOSIS — L2084 Intrinsic (allergic) eczema: Secondary | ICD-10-CM

## 2022-06-28 DIAGNOSIS — H1010 Acute atopic conjunctivitis, unspecified eye: Secondary | ICD-10-CM

## 2022-06-28 DIAGNOSIS — J4550 Severe persistent asthma, uncomplicated: Secondary | ICD-10-CM

## 2022-06-28 MED ORDER — DULERA 200-5 MCG/ACT IN AERO: 2.0000 | INHALATION_SPRAY | Freq: Two times a day (BID) | RESPIRATORY_TRACT | 5 refills | Status: DC

## 2022-06-28 MED ORDER — MONTELUKAST SODIUM 10 MG PO TABS: 10.0000 mg | ORAL_TABLET | Freq: Every day | ORAL | 5 refills | Status: DC

## 2022-06-28 MED ORDER — SPIRIVA RESPIMAT 1.25 MCG/ACT IN AERS: INHALATION_SPRAY | RESPIRATORY_TRACT | 5 refills | Status: DC

## 2022-06-28 MED ORDER — VENTOLIN HFA 108 (90 BASE) MCG/ACT IN AERS: INHALATION_SPRAY | RESPIRATORY_TRACT | 1 refills | Status: DC

## 2022-06-28 MED ORDER — FLUTICASONE PROPIONATE 50 MCG/ACT NA SUSP: 2.0000 | Freq: Every day | NASAL | 3 refills | Status: DC | PRN

## 2022-06-28 MED ORDER — CETIRIZINE HCL 10 MG PO TABS: 10.0000 mg | ORAL_TABLET | Freq: Every day | ORAL | 5 refills | Status: DC | PRN

## 2022-08-07 ENCOUNTER — Ambulatory Visit (INDEPENDENT_AMBULATORY_CARE_PROVIDER_SITE_OTHER): Payer: Medicaid Other | Admitting: Family Medicine

## 2022-08-07 ENCOUNTER — Other Ambulatory Visit: Payer: Self-pay

## 2022-08-07 ENCOUNTER — Encounter: Payer: Self-pay | Admitting: Family Medicine

## 2022-08-07 VITALS — BP 126/78 | HR 68 | Temp 97.9°F | Resp 16 | Ht 61.0 in | Wt 235.3 lb

## 2022-08-07 DIAGNOSIS — H1010 Acute atopic conjunctivitis, unspecified eye: Secondary | ICD-10-CM

## 2022-08-07 DIAGNOSIS — J3089 Other allergic rhinitis: Secondary | ICD-10-CM

## 2022-08-07 DIAGNOSIS — H1013 Acute atopic conjunctivitis, bilateral: Secondary | ICD-10-CM

## 2022-08-07 DIAGNOSIS — J4550 Severe persistent asthma, uncomplicated: Secondary | ICD-10-CM

## 2022-08-07 DIAGNOSIS — L2084 Intrinsic (allergic) eczema: Secondary | ICD-10-CM | POA: Diagnosis not present

## 2022-08-07 DIAGNOSIS — J302 Other seasonal allergic rhinitis: Secondary | ICD-10-CM

## 2022-08-07 NOTE — Patient Instructions (Addendum)
Asthma Continue montelukast 10 mg once a day to prevent cough or wheeze Continue Dulera 200-2 puffs twice a day with a spacer to prevent cough or wheeze Continue Spiriva 1.25 mcg 2 puffs once a day to prevent cough or wheeze Continue albuterol 2 puffs once every 4 hours as needed for cough or wheeze You may use albuterol 2 puffs 5 to 15 minutes before activity to decrease cough or wheeze We will submit your information to out biologics coordinator to restart Fasenra  Allergic rhinitis Continue allergen avoidance measures directed toward pollens, pets, dust mite, mold, and cockroach as listed below An order has been placed to help [place] evaluate your environmental allergies. We will call you when the results become available Continue cetirizine 10 mg once a day as needed for runny nose or itch. Remember to rotate to a different antihistamine about every 3 months. Some examples of over the counter antihistamines include Zyrtec (cetirizine), Xyzal (levocetirizine), Allegra (fexofenadine), and Claritin (loratidine).  Continue Flonase 2 sprays in each nostril once a day as needed for a stuffy nose Consider saline nasal rinses as needed for nasal symptoms. Use this before any medicated nasal sprays for best result  Allergic conjunctivitis Some over the counter eye drops include Pataday one drop in each eye once a day as needed for red, itchy eyes OR Zaditor one drop in each eye twice a day as needed for red itchy eyes.  Atopic dermatitis Continue a daily moisturizing routine For stubborn red, itchy areas below your face, begin triamcinolone 0.1% ointment twice a day as needed.  Do not use this medication for longer than 2 weeks in a row For stubborn red itchy areas on your face, begin desonide 0.05% ointment twice a day as needed.  Do not use this medication for longer than 2 weeks in a row  Call the clinic if this treatment plan is not working well for you  Follow up in 1 month or sooner if  needed.  Reducing Pollen Exposure The American Academy of Allergy, Asthma and Immunology suggests the following steps to reduce your exposure to pollen during allergy seasons. Do not hang sheets or clothing out to dry; pollen may collect on these items. Do not mow lawns or spend time around freshly cut grass; mowing stirs up pollen. Keep windows closed at night.  Keep car windows closed while driving. Minimize morning activities outdoors, a time when pollen counts are usually at their highest. Stay indoors as much as possible when pollen counts or humidity is high and on windy days when pollen tends to remain in the air longer. Use air conditioning when possible.  Many air conditioners have filters that trap the pollen spores. Use a HEPA room air filter to remove pollen form the indoor air you breathe.  Control of Dog or Cat Allergen Avoidance is the best way to manage a dog or cat allergy. If you have a dog or cat and are allergic to dog or cats, consider removing the dog or cat from the home. If you have a dog or cat but don't want to find it a new home, or if your family wants a pet even though someone in the household is allergic, here are some strategies that may help keep symptoms at bay:  Keep the pet out of your bedroom and restrict it to only a few rooms. Be advised that keeping the dog or cat in only one room will not limit the allergens to that room. Don't pet, hug  or kiss the dog or cat; if you do, wash your hands with soap and water. High-efficiency particulate air (HEPA) cleaners run continuously in a bedroom or living room can reduce allergen levels over time. Regular use of a high-efficiency vacuum cleaner or a central vacuum can reduce allergen levels. Giving your dog or cat a bath at least once a week can reduce airborne allergen.   Control of Dust Mite Allergen Dust mites play a major role in allergic asthma and rhinitis. They occur in environments with high humidity  wherever human skin is found. Dust mites absorb humidity from the atmosphere (ie, they do not drink) and feed on organic matter (including shed human and animal skin). Dust mites are a microscopic type of insect that you cannot see with the naked eye. High levels of dust mites have been detected from mattresses, pillows, carpets, upholstered furniture, bed covers, clothes, soft toys and any woven material. The principal allergen of the dust mite is found in its feces. A gram of dust may contain 1,000 mites and 250,000 fecal particles. Mite antigen is easily measured in the air during house cleaning activities. Dust mites do not bite and do not cause harm to humans, other than by triggering allergies/asthma.  Ways to decrease your exposure to dust mites in your home:  1. Encase mattresses, box springs and pillows with a mite-impermeable barrier or cover  2. Wash sheets, blankets and drapes weekly in hot water (130 F) with detergent and dry them in a dryer on the hot setting.  3. Have the room cleaned frequently with a vacuum cleaner and a damp dust-mop. For carpeting or rugs, vacuuming with a vacuum cleaner equipped with a high-efficiency particulate air (HEPA) filter. The dust mite allergic individual should not be in a room which is being cleaned and should wait 1 hour after cleaning before going into the room.  4. Do not sleep on upholstered furniture (eg, couches).  5. If possible removing carpeting, upholstered furniture and drapery from the home is ideal. Horizontal blinds should be eliminated in the rooms where the person spends the most time (bedroom, study, television room). Washable vinyl, roller-type shades are optimal.  6. Remove all non-washable stuffed toys from the bedroom. Wash stuffed toys weekly like sheets and blankets above.  7. Reduce indoor humidity to less than 50%. Inexpensive humidity monitors can be purchased at most hardware stores. Do not use a humidifier as can make the  problem worse and are not recommended.  Control of Mold Allergen Mold and fungi can grow on a variety of surfaces provided certain temperature and moisture conditions exist.  Outdoor molds grow on plants, decaying vegetation and soil.  The major outdoor mold, Alternaria and Cladosporium, are found in very high numbers during hot and dry conditions.  Generally, a late Summer - Fall peak is seen for common outdoor fungal spores.  Rain will temporarily lower outdoor mold spore count, but counts rise rapidly when the rainy period ends.  The most important indoor molds are Aspergillus and Penicillium.  Dark, humid and poorly ventilated basements are ideal sites for mold growth.  The next most common sites of mold growth are the bathroom and the kitchen.  Outdoor [employer] Use air conditioning and keep windows closed Avoid exposure to decaying vegetation. Avoid leaf raking. Avoid grain handling. Consider wearing a face mask if working in moldy areas.  Indoor Mold Control Maintain humidity below 50%. Clean washable surfaces with 5% bleach solution. Remove sources e.g. Contaminated carpets.  Control of Mold Allergen Mold and fungi can grow on a variety of surfaces provided certain temperature and moisture conditions exist.  Outdoor molds grow on plants, decaying vegetation and soil.  The major outdoor mold, Alternaria and Cladosporium, are found in very high numbers during hot and dry conditions.  Generally, a late Summer - Fall peak is seen for common outdoor fungal spores.  Rain will temporarily lower outdoor mold spore count, but counts rise rapidly when the rainy period ends.  The most important indoor molds are Aspergillus and Penicillium.  Dark, humid and poorly ventilated basements are ideal sites for mold growth.  The next most common sites of mold growth are the bathroom and the kitchen.  Outdoor [employer] Use air conditioning and keep windows closed Avoid exposure to decaying  vegetation. Avoid leaf raking. Avoid grain handling. Consider wearing a face mask if working in moldy areas.  Indoor Mold Control Maintain humidity below 50%. Clean washable surfaces with 5% bleach solution. Remove sources e.g. Contaminated carpets.

## 2022-08-07 NOTE — Progress Notes (Signed)
[address] [place] [address] Dept: [phone]  FOLLOW UP NOTE  Patient ID: Ariel Mooney, female    DOB: 2001/01/05  Age: 21 y.o. MRN: [id number] Date of Office Visit: 08/07/2022  Assessment  Chief Complaint: Asthma (1 mth f/u - Same/ACT: 14)  HPI Ariel Mooney is a 21 year old female who presents to the clinic for follow-up visit.  She was last seen in this clinic on 06/28/2022 by Thermon Leyland, FNP, for evaluation of asthma, allergic rhinitis, allergic conjunctivitis, and atopic dermatitis.  At today's visit, she reports her asthma has been poorly controlled with symptoms including shortness of breath with rest and activity as well as wheeze with rest and activity.  She denies cough at this time.  She continues montelukast 10 mg once a day, Dulera 200-2 puffs twice a day, Spiriva 1.25 mcg 2 puffs once a day, and use using albuterol 2-3 times a day over the last month.  She has previously used [place] with relief of asthma symptoms.  Chart review indicates that on 10/22/2021 her absolute eosinophil count was 300.  Allergic rhinitis is reported as moderately well controlled with symptoms including frequent clear thick rhinorrhea, nasal congestion, and occasional postnasal drainage with throat clearing.  She continues cetirizine 10 mg once a day, Flonase 2 sprays in each nostril once a day, and nasal saline rinses with mild relief of symptoms. Her last environmental allergy testing was on 10/22/2018 via lab and was positive to grass pollen, weed pollen, tree pollen, ragweed pollen, mold, dust mite, cat, and dog.  She is interested in beginning allergy injections in the near future.  Allergic conjunctivitis is reported as poorly controlled with red and itchy eyes with frequent clear drainage for which she is not currently using any medical intervention.  Atopic dermatitis is reported as moderately well controlled with red and itchy areas occurring mainly on her left arm.  She has used  hydrocortisone 1% with no improvement of her symptoms.  Her current medications are listed in the chart.   Drug Allergies:  Allergies  Allergen Reactions   Lactose Intolerance (Gi) Hives and Shortness Of Breath   Latex Shortness Of Breath and Rash   Milk (Cow) Hives    Physical Exam: BP 126/78 (BP Location: Right Arm, Patient Position: Sitting, Cuff Size: Large)   Pulse 68   Temp 97.9 F (36.6 C) (Temporal)   Resp 16   Ht 5\' 1"  (1.549 m)   Wt 235 lb 4.8 oz (106.7 kg)   SpO2 98%   BMI 44.46 kg/m    Physical Exam Vitals reviewed.  Constitutional:      Appearance: Normal appearance.  HENT:     Head: Normocephalic and atraumatic.     Right Ear: Tympanic membrane normal.     Left Ear: Tympanic membrane normal.     Nose:     Comments: Bilateral nares edematous and pale with clear nasal drainage noted.  Pharynx normal.  Ears normal.  Eyes normal.    Mouth/Throat:     Pharynx: Oropharynx is clear.  Eyes:     Conjunctiva/sclera: Conjunctivae normal.  Cardiovascular:     Rate and Rhythm: Normal rate and regular rhythm.     Heart sounds: Normal heart sounds. No murmur heard. Pulmonary:     Effort: Pulmonary effort is normal.     Comments: Bilateral expiratory wheeze Musculoskeletal:        General: Normal range of motion.     Cervical back: Normal range of motion  and neck supple.  Skin:    General: Skin is warm and dry.  Neurological:     Mental Status: She is alert and oriented to person, place, and time.  Psychiatric:        Mood and Affect: Mood normal.        Behavior: Behavior normal.        Thought Content: Thought content normal.        Judgment: Judgment normal.     Diagnostics: FVC 3.57, FEV1 3.12.  Predicted FVC 3.49, predicted FEV1 3.04.  Thrombin tree indicates normal ventilatory function.  Post bronchodilator therapy FVC 3.59, FEV1 1.13.  Postbronchodilator spirometry indicates no significant improvement in FEV1.  Assessment and Plan: 1. Not well  controlled severe persistent asthma   2. Seasonal and perennial allergic rhinitis   3. Seasonal allergic conjunctivitis   4. Intrinsic atopic dermatitis     Patient Instructions  Asthma Restart montelukast 10 mg once a day to prevent cough or wheeze Restart Dulera 200-2 puffs twice a day with a spacer to prevent cough or wheeze Restart Spiriva 1.25 mcg 2 puffs once a day to prevent cough or wheeze Continue albuterol 2 puffs once every 4 hours as needed for cough or wheeze You may use albuterol 2 puffs 5 to 15 minutes before activity to decrease cough or wheeze We will submit your information to out biologics coordinator to restart Fasenra  Allergic rhinitis Continue allergen avoidance measures directed toward pollens, pets, dust mite, mold, and cockroach as listed below An order has been placed to help [place] evaluate your environmental allergies. We will call you when the results become available Continue cetirizine 10 mg once a day as needed for runny nose or itch. Remember to rotate to a different antihistamine about every 3 months. Some examples of over the counter antihistamines include Zyrtec (cetirizine), Xyzal (levocetirizine), Allegra (fexofenadine), and Claritin (loratidine).  Continue Flonase 2 sprays in each nostril once a day as needed for a stuffy nose Consider saline nasal rinses as needed for nasal symptoms. Use this before any medicated nasal sprays for best result  Allergic conjunctivitis Some over the counter eye drops include Pataday one drop in each eye once a day as needed for red, itchy eyes OR Zaditor one drop in each eye twice a day as needed for red itchy eyes.  Atopic dermatitis Continue a daily moisturizing routine For stubborn red, itchy areas below your face, begin triamcinolone 0.1% ointment twice a day as needed.  Do not use this medication for longer than 2 weeks in a row For stubborn red itchy areas on your face, begin desonide 0.05% ointment twice a day as  needed.  Do not use this medication for longer than 2 weeks in a row  Call the clinic if this treatment plan is not working well for you  Follow up in 1 month or sooner if needed.   Return in about 4 weeks (around 09/04/2022), or if symptoms worsen or fail to improve.    Thank you for the opportunity to care for this patient.  Please do not hesitate to contact me with questions.  Thermon Leyland, FNP [facility]

## 2022-08-08 ENCOUNTER — Ambulatory Visit: Payer: Medicaid Other | Admitting: Allergy

## 2022-08-08 ENCOUNTER — Other Ambulatory Visit (HOSPITAL_COMMUNITY): Payer: Self-pay

## 2022-08-08 ENCOUNTER — Telehealth: Payer: Self-pay | Admitting: *Deleted

## 2022-08-08 MED ORDER — FASENRA 30 MG/ML ~~LOC~~ SOSY
30.0000 mg | PREFILLED_SYRINGE | SUBCUTANEOUS | 9 refills | Status: DC
  Filled 2022-08-08 (×2): qty 1, 28d supply, fill #0
  Filled 2022-09-03: qty 1, 28d supply, fill #1
  Filled 2022-11-12: qty 1, 28d supply, fill #2
  Filled 2023-01-17: qty 1, 28d supply, fill #3

## 2022-08-08 NOTE — Telephone Encounter (Signed)
-----   Message from Hetty Blend, FNP sent at 08/07/2022  4:37 PM EST ----- Hi there Emmory Solivan, I have this patient back in the clinic with no improvement in her symptoms of asthma after using the prescribed asthma treatment over the last month. Can you please submit her for [facility] Challenger again. Thank you so much

## 2022-08-08 NOTE — Telephone Encounter (Signed)
Called patient and advised approval and submit for Fasenra to [facility] and will reach once delivery set to make appt to restart therapy

## 2022-08-09 ENCOUNTER — Other Ambulatory Visit (HOSPITAL_COMMUNITY): Payer: Self-pay

## 2022-08-09 NOTE — Telephone Encounter (Signed)
Thank you :)

## 2022-08-10 ENCOUNTER — Other Ambulatory Visit (HOSPITAL_COMMUNITY): Payer: Self-pay

## 2022-08-10 LAB — ALLERGENS, ZONE 2
Alternaria Alternata IgE: <0.1 kU/L
Amer Sycamore IgE Qn: 0.11 kU/L — AB
Aspergillus Fumigatus IgE: <0.1 kU/L
Bahia Grass IgE: 7.76 kU/L — AB
Bermuda Grass IgE: 1.86 kU/L — AB
Cat Dander IgE: 15.9 kU/L — AB
Cedar, Mountain IgE: 0.11 kU/L — AB
Cladosporium Herbarum IgE: <0.1 kU/L
Cockroach, American IgE: <0.1 kU/L
Common Silver Birch IgE: 0.11 kU/L — AB
D Farinae IgE: 9.92 kU/L — AB
D Pteronyssinus IgE: 14.8 kU/L — AB
Dog Dander IgE: 42.2 kU/L — AB
Elm, American IgE: 0.16 kU/L — AB
Hickory, White IgE: 3.16 kU/L — AB
Johnson Grass IgE: 2.07 kU/L — AB
Maple/Box Elder IgE: <0.1 kU/L
Mucor Racemosus IgE: <0.1 kU/L
Mugwort IgE Qn: <0.1 kU/L
Nettle IgE: <0.1 kU/L
Oak, White IgE: <0.1 kU/L
Penicillium Chrysogen IgE: <0.1 kU/L
Pigweed, Rough IgE: <0.1 kU/L
Plantain, English IgE: 0.27 kU/L — AB
Ragweed, Short IgE: 0.18 kU/L — AB
Sheep Sorrel IgE Qn: 0.15 kU/L — AB
Stemphylium Herbarum IgE: <0.1 kU/L
Sweet gum IgE RAST Ql: <0.1 kU/L
Timothy Grass IgE: 17.9 kU/L — AB
White Mulberry IgE: <0.1 kU/L

## 2022-08-10 NOTE — Progress Notes (Signed)
Can you please let this patient know that her allergy testing has returned and indicates that she is positive to dust mites, cat, dog, grass pollen, tree pollen, weed pollen, and ragweed pollen.  If she is interested in allergen immunotherapy, please have her call the clinic to set up the first appointment.  Can you please mail out allergen immunotherapy packet and avoidance measures.  Thank you

## 2022-08-17 ENCOUNTER — Other Ambulatory Visit (HOSPITAL_COMMUNITY): Payer: Self-pay

## 2022-09-03 ENCOUNTER — Other Ambulatory Visit (HOSPITAL_COMMUNITY): Payer: Self-pay

## 2022-09-13 ENCOUNTER — Other Ambulatory Visit: Payer: Self-pay

## 2022-09-21 ENCOUNTER — Ambulatory Visit: Payer: Medicaid Other | Admitting: Allergy

## 2022-09-21 ENCOUNTER — Ambulatory Visit: Payer: Medicaid Other

## 2022-10-01 ENCOUNTER — Ambulatory Visit (INDEPENDENT_AMBULATORY_CARE_PROVIDER_SITE_OTHER): Payer: Medicaid Other | Admitting: *Deleted

## 2022-10-01 DIAGNOSIS — J4550 Severe persistent asthma, uncomplicated: Secondary | ICD-10-CM

## 2022-10-01 MED ORDER — BENRALIZUMAB 30 MG/ML ~~LOC~~ SOSY
30.0000 mg | PREFILLED_SYRINGE | Freq: Once | SUBCUTANEOUS | Status: AC
  Administered 2022-10-01: 30 mg via SUBCUTANEOUS

## 2022-10-01 NOTE — Progress Notes (Signed)
Immunotherapy   Patient Details  Name: Ariel Mooney MRN: [id number] Date of Birth: 2001-04-12  10/01/2022  Ariel Mooney started injections for  Ariel Mooney  Frequency:\ Every 4 Weeks x3, Then every 8 weeks  Epi-Pen: Not Required  Consent signed and patient instructions given. Patient restarted Ariel Mooney today and received 63m in the RUA. Patient waited 30 minutes in office and did not experience any issues.    [name] 10/01/2022, 4:00 PM

## 2022-10-09 ENCOUNTER — Other Ambulatory Visit (HOSPITAL_COMMUNITY): Payer: Self-pay

## 2022-10-11 ENCOUNTER — Other Ambulatory Visit (HOSPITAL_COMMUNITY): Payer: Self-pay

## 2022-10-15 ENCOUNTER — Other Ambulatory Visit (HOSPITAL_COMMUNITY): Payer: Self-pay

## 2022-10-24 ENCOUNTER — Other Ambulatory Visit: Payer: Self-pay | Admitting: Family Medicine

## 2022-10-26 ENCOUNTER — Ambulatory Visit: Payer: Medicaid Other | Admitting: Allergy

## 2022-10-29 ENCOUNTER — Ambulatory Visit: Payer: Medicaid Other

## 2022-11-02 ENCOUNTER — Ambulatory Visit (INDEPENDENT_AMBULATORY_CARE_PROVIDER_SITE_OTHER): Payer: Medicaid Other

## 2022-11-02 DIAGNOSIS — J4550 Severe persistent asthma, uncomplicated: Secondary | ICD-10-CM | POA: Diagnosis not present

## 2022-11-02 MED ORDER — BENRALIZUMAB 30 MG/ML ~~LOC~~ SOSY
30.0000 mg | PREFILLED_SYRINGE | Freq: Once | SUBCUTANEOUS | Status: AC
  Administered 2022-11-02: 30 mg via SUBCUTANEOUS

## 2022-11-08 ENCOUNTER — Other Ambulatory Visit (HOSPITAL_COMMUNITY): Payer: Self-pay

## 2022-11-08 ENCOUNTER — Ambulatory Visit: Payer: Medicaid Other | Admitting: Family Medicine

## 2022-11-08 NOTE — Progress Notes (Deleted)
   [place] [address] Dept: [phone]  FOLLOW UP NOTE  Patient ID: Ariel Mooney, female    DOB: 13-Jul-2001  Age: 22 y.o. MRN: [id number] Date of Office Visit: 11/08/2022  Assessment  Chief Complaint: No chief complaint on file.  HPI Ariel Mooney is a 22 year old female who presents to the clinic for follow-up visit.  She was last seen in this clinic on 08/07/2022 by Gareth Morgan, FNP, for evaluation of asthma, allergic rhinitis, allergic conjunctivitis, and atopic dermatitis.  She started on Fasenra shortly after that visit. Her last environmental allergy testing via lab was on 08/07/2022 and was positive to dust mite, cat, dog, grass pollen, tree pollen, ragweed pollen, and weed pollen   Drug Allergies:  Allergies  Allergen Reactions   Lactose Intolerance (Gi) Hives and Shortness Of Breath   Latex Shortness Of Breath and Rash   Milk (Cow) Hives    Physical Exam: There were no vitals taken for this visit.   Physical Exam  Diagnostics:    Assessment and Plan: No diagnosis found.  No orders of the defined types were placed in this encounter.   There are no Patient Instructions on file for this visit.  No follow-ups on file.    Thank you for the opportunity to care for this patient.  Please do not hesitate to contact me with questions.  Gareth Morgan, FNP [facility]

## 2022-11-12 ENCOUNTER — Other Ambulatory Visit (HOSPITAL_COMMUNITY): Payer: Self-pay

## 2022-11-19 ENCOUNTER — Other Ambulatory Visit: Payer: Self-pay

## 2022-11-19 ENCOUNTER — Other Ambulatory Visit (HOSPITAL_COMMUNITY): Payer: Self-pay

## 2022-11-30 ENCOUNTER — Ambulatory Visit: Payer: Medicaid Other

## 2023-01-01 ENCOUNTER — Other Ambulatory Visit: Payer: Self-pay | Admitting: Obstetrics and Gynecology

## 2023-01-01 DIAGNOSIS — Z01818 Encounter for other preprocedural examination: Secondary | ICD-10-CM

## 2023-01-15 ENCOUNTER — Other Ambulatory Visit (HOSPITAL_COMMUNITY): Payer: Self-pay

## 2023-01-17 ENCOUNTER — Other Ambulatory Visit: Payer: Self-pay

## 2023-01-17 IMAGING — US US ABDOMEN LIMITED
1 series · 15 of 25 positions shown · non-contrast
Comparison: 01/08/2013.

CLINICAL DATA: Right upper quadrant pain.

EXAM:
ULTRASOUND ABDOMEN LIMITED RIGHT UPPER QUADRANT

[Series 1: us abdomen limited · 46 acquisitions, 15 frames shown]
[im 1/46]
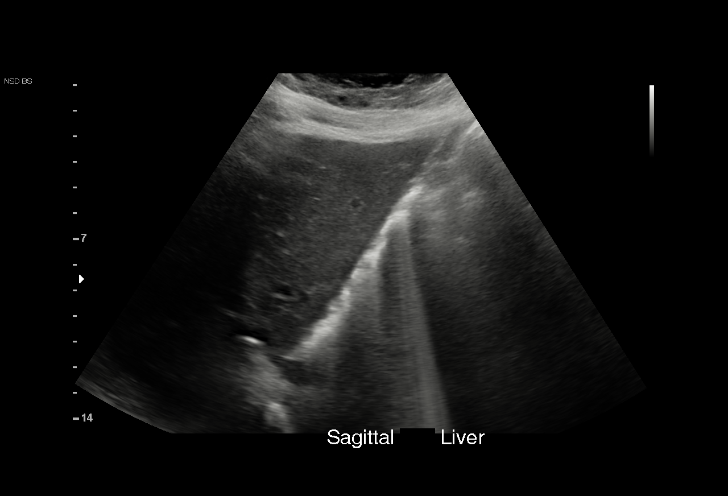
[im 4/46]
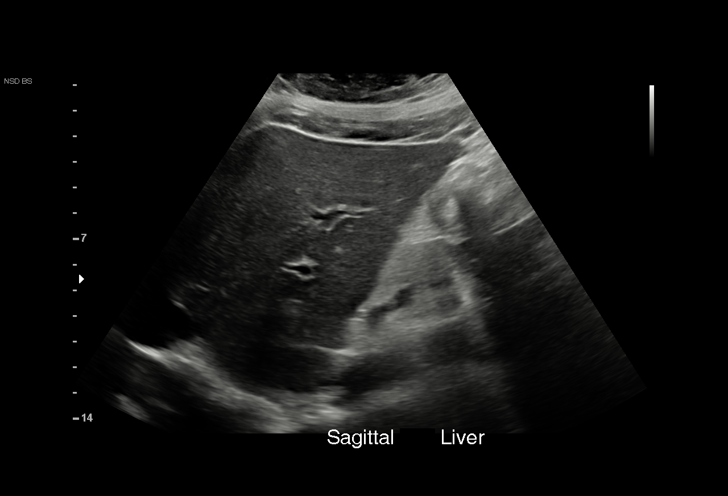
[im 8/46]
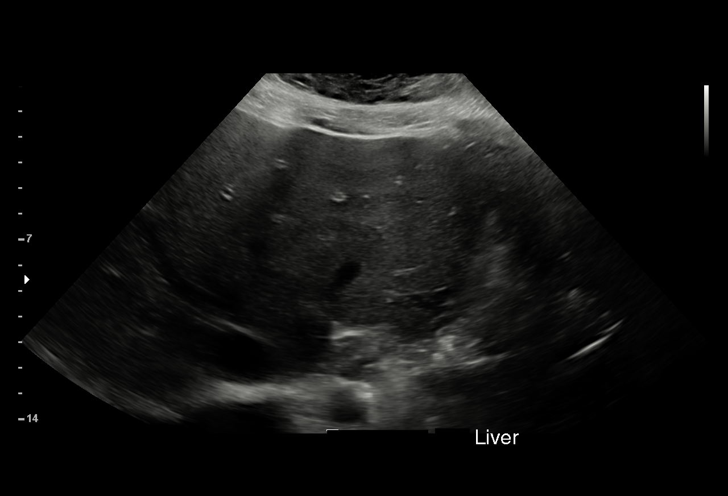
[im 10/46]
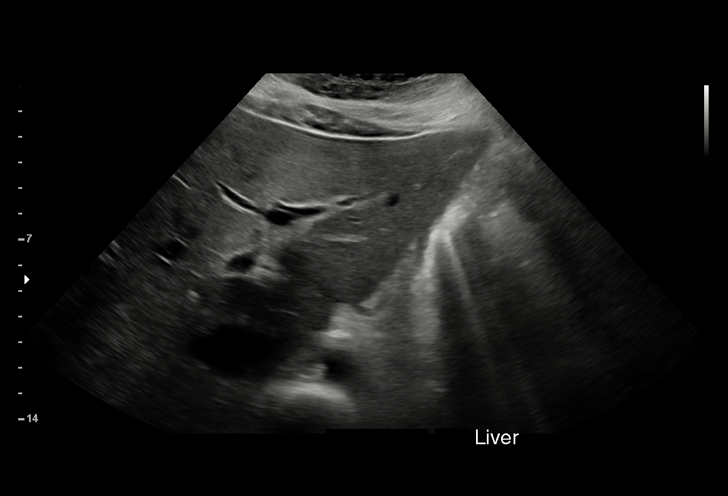
[im 14/46]
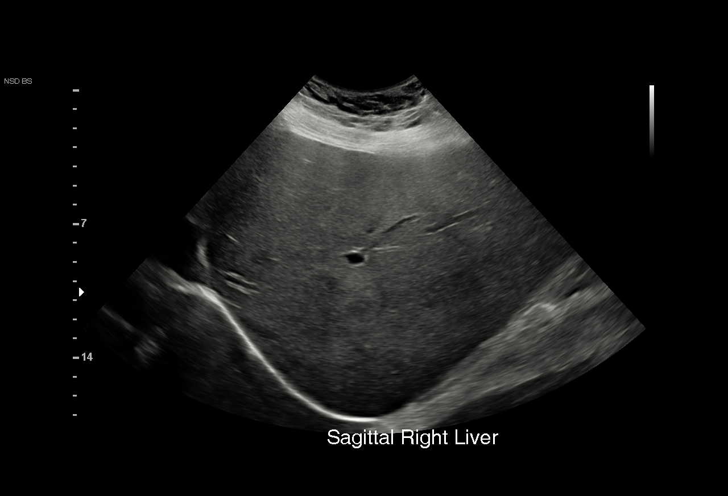
[im 17/46]
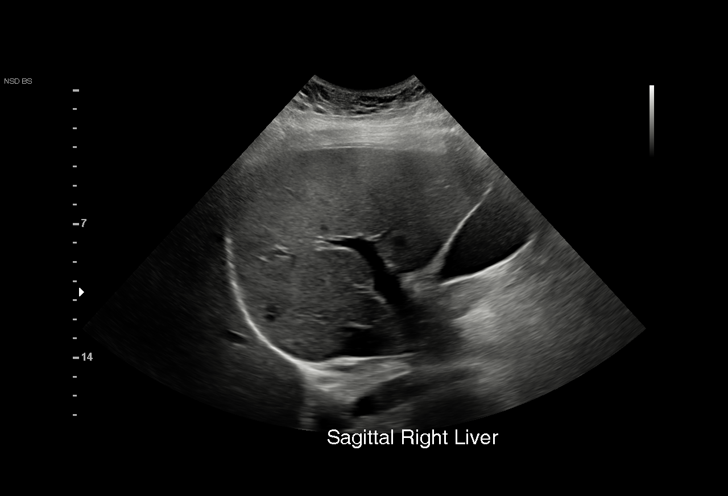
[im 19/46]
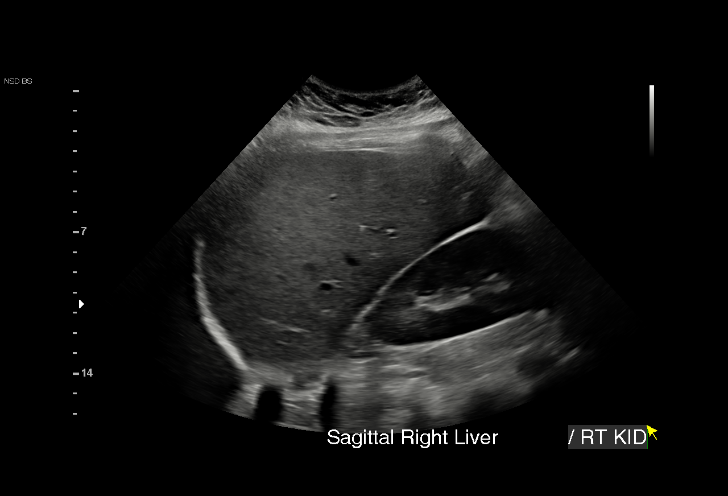
[im 23/46]
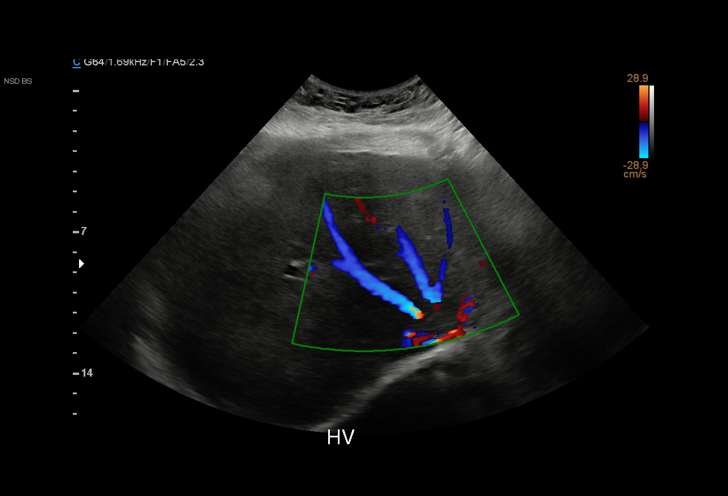
[im 27/46]
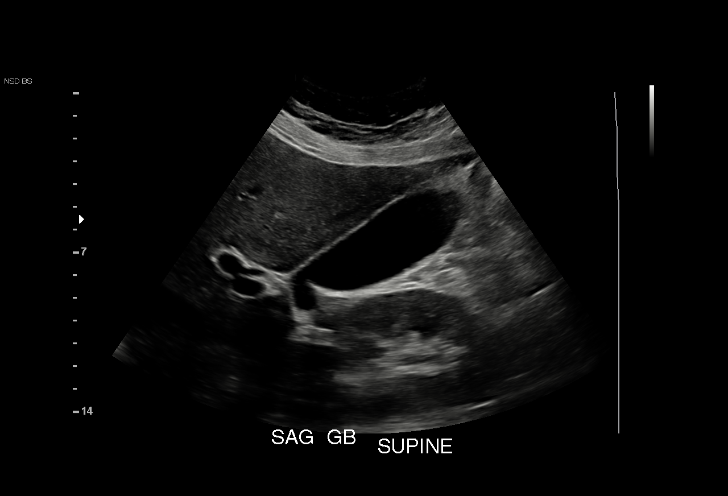
[im 29/46]
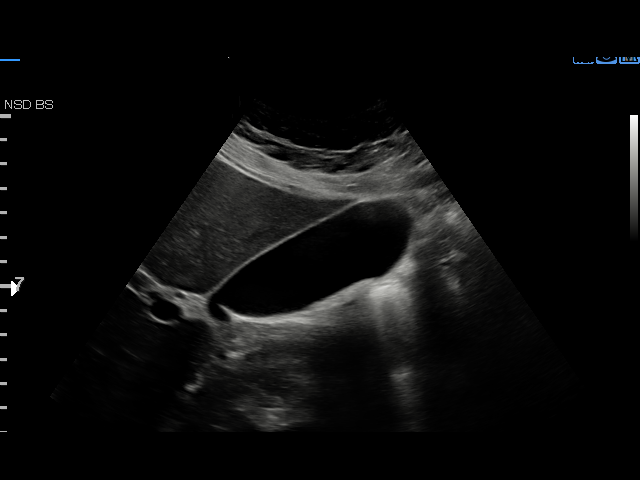
[im 32/46]
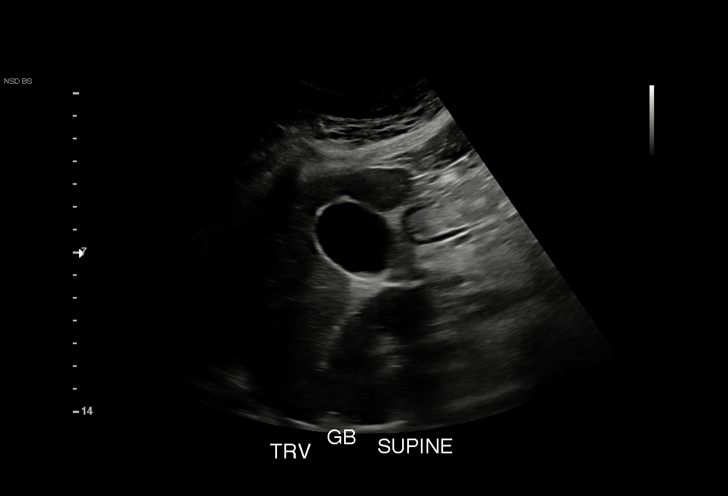
[im 36/46]
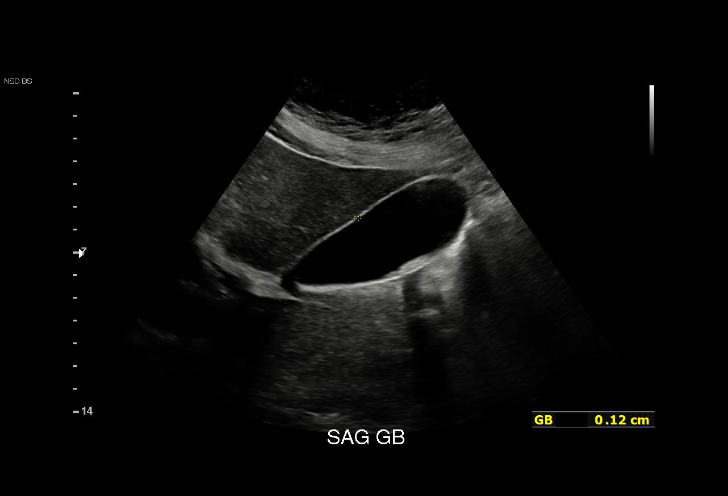
[im 38/46]
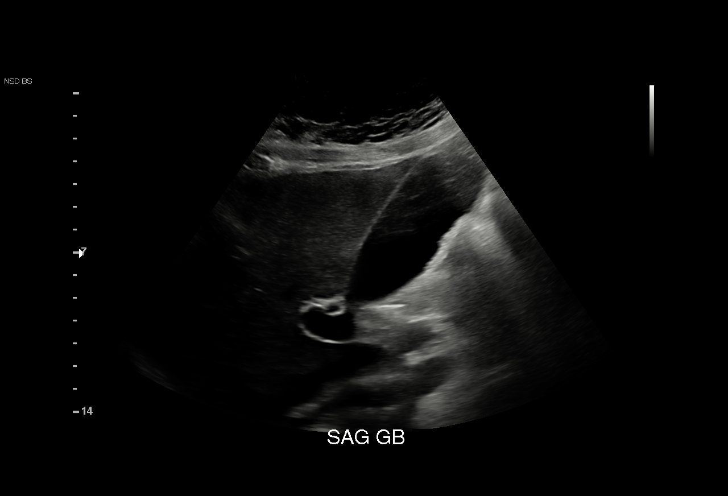
[im 42/46]
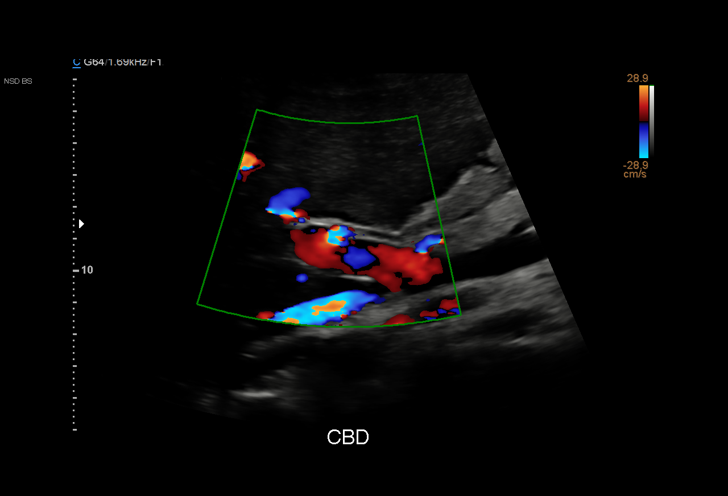
[im 46/46]
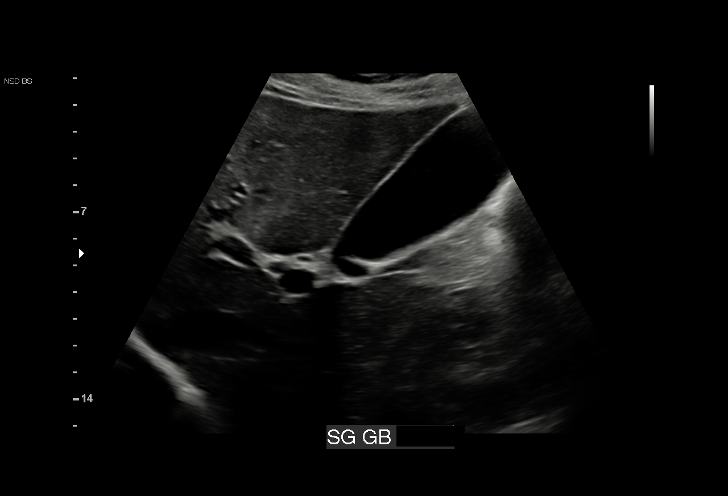

[15 of 25 positions shown; findings below may reference images not displayed]

FINDINGS: Gallbladder:

Multiple shadowing stones are identified. No gallbladder wall
thickening or pericholecystic fluid. No sonographic Murphy sign
noted by sonographer.

Common bile duct:

Diameter: 1.8 mm.

Liver:

No focal lesion identified. Within normal limits in parenchymal
echogenicity. Portal vein is patent on color Doppler imaging with
normal direction of blood flow towards the liver.

Other: No free fluid.
IMPRESSION: Cholelithiasis without acute cholecystitis.

## 2023-01-21 ENCOUNTER — Other Ambulatory Visit (HOSPITAL_COMMUNITY): Payer: Self-pay

## 2023-01-22 ENCOUNTER — Other Ambulatory Visit: Payer: Self-pay | Admitting: Family Medicine

## 2023-01-28 ENCOUNTER — Ambulatory Visit (INDEPENDENT_AMBULATORY_CARE_PROVIDER_SITE_OTHER): Payer: Medicaid Other | Admitting: *Deleted

## 2023-01-28 DIAGNOSIS — J4550 Severe persistent asthma, uncomplicated: Secondary | ICD-10-CM

## 2023-01-28 MED ORDER — BENRALIZUMAB 30 MG/ML ~~LOC~~ SOSY
30.0000 mg | PREFILLED_SYRINGE | Freq: Once | SUBCUTANEOUS | Status: AC
  Administered 2023-01-28: 30 mg via SUBCUTANEOUS

## 2023-01-31 ENCOUNTER — Encounter: Payer: Self-pay | Admitting: Allergy

## 2023-01-31 ENCOUNTER — Ambulatory Visit (INDEPENDENT_AMBULATORY_CARE_PROVIDER_SITE_OTHER): Payer: Medicaid Other | Admitting: Allergy

## 2023-01-31 ENCOUNTER — Other Ambulatory Visit: Payer: Self-pay

## 2023-01-31 VITALS — BP 118/68 | HR 90 | Temp 98.5°F | Resp 18

## 2023-01-31 DIAGNOSIS — J4550 Severe persistent asthma, uncomplicated: Secondary | ICD-10-CM

## 2023-01-31 DIAGNOSIS — H1013 Acute atopic conjunctivitis, bilateral: Secondary | ICD-10-CM

## 2023-01-31 DIAGNOSIS — J3089 Other allergic rhinitis: Secondary | ICD-10-CM | POA: Diagnosis not present

## 2023-01-31 DIAGNOSIS — H1010 Acute atopic conjunctivitis, unspecified eye: Secondary | ICD-10-CM

## 2023-01-31 DIAGNOSIS — J302 Other seasonal allergic rhinitis: Secondary | ICD-10-CM

## 2023-01-31 MED ORDER — VENTOLIN HFA 108 (90 BASE) MCG/ACT IN AERS: INHALATION_SPRAY | RESPIRATORY_TRACT | 1 refills | Status: DC

## 2023-01-31 MED ORDER — DYMISTA 137-50 MCG/ACT NA SUSP: NASAL | 5 refills | Status: DC

## 2023-01-31 MED ORDER — LEVOCETIRIZINE DIHYDROCHLORIDE 5 MG PO TABS
5.0000 mg | ORAL_TABLET | Freq: Every evening | ORAL | 5 refills | Status: DC
Start: 2023-01-31 — End: 2024-05-29

## 2023-01-31 MED ORDER — MONTELUKAST SODIUM 10 MG PO TABS: 10.0000 mg | ORAL_TABLET | Freq: Every day | ORAL | 5 refills | Status: DC

## 2023-01-31 MED ORDER — CROMOLYN SODIUM 4 % OP SOLN: OPHTHALMIC | 5 refills | Status: DC

## 2023-01-31 MED ORDER — BREZTRI AEROSPHERE 160-9-4.8 MCG/ACT IN AERO: 2.0000 | INHALATION_SPRAY | Freq: Two times a day (BID) | RESPIRATORY_TRACT | 5 refills | Status: DC

## 2023-01-31 MED ORDER — DESONIDE 0.05 % EX OINT: TOPICAL_OINTMENT | CUTANEOUS | 5 refills | Status: DC

## 2023-01-31 MED ORDER — TRIAMCINOLONE ACETONIDE 0.1 % EX OINT: TOPICAL_OINTMENT | CUTANEOUS | 5 refills | Status: DC

## 2023-01-31 NOTE — Patient Instructions (Addendum)
Asthma Continue Fasenra injections every 8 weeks.  Will discuss with Tammy, our asthma injectable coodinator, regarding possibility of at home administration.  Continue montelukast 10 mg once a day to prevent cough or wheeze For now continue Dulera 2 puffs twice a day with a spacer AND Spiriva 1.14mcg 2 puffs once a day to prevent cough or wheeze Will see if we can get Breztri inhaler 2 puffs twice a day covered for you which would replace use of Dulera and Spiriva.   Continue albuterol 2 puffs once every 4 hours as needed for cough or wheeze You may use albuterol 2 puffs 5 to 15 minutes before activity to decrease cough or wheeze  Allergic rhinitis and conjunctivtis Continue allergen avoidance measures for pollens, pets, dust mite, mold, and cockroach as listed below Stop Zyrtec Start Xyzal 5mg  daily Start Dymista 1 spray each nostril twice a day as needed for nasal drainage or congestion.  Use Cromolyn 2 drops each eye up to 4 times a day as needed for itchy/watery eyes Consider allergy shots as a means of long-term control.  Allergy shots "re-train" and "reset" the immune system to ignore environmental allergens and decrease the resulting immune response to those allergens (sneezing, itchy watery eyes, runny nose, nasal congestion, etc).   Allergy shots improve symptoms in 75-85% of patients.   Let us know if you would like to proceed with this therapy.   Atopic dermatitis Continue a daily moisturizing routine For stubborn red, itchy areas below your face, begin triamcinolone 0.1% ointment twice a day as needed.  Do not use this medication for longer than 2 weeks in a row For stubborn red itchy areas on your face, begin desonide 0.05% ointment twice a day as needed.  Do not use this medication for longer than 2 weeks in a row   Follow up in 4-6 month or sooner if needed.

## 2023-01-31 NOTE — Progress Notes (Signed)
Follow-up Note  RE: Ariel Mooney MRN: [id number] DOB: September 26, 2000 Date of Office Visit: 01/31/2023   History of present illness: Ariel Mooney is a 22 y.o. female presenting today for follow-up of asthma, allergic rhinitis with conjunctivitis and eczema.  She was last seen in the office on 08/07/22 by our nurse practitioner Ambs.  She brings her son with her to visit today.    She states she has her moments where she has good months and spiraling months with shortness of breath, cough and wheezing.  She will use her albuterol inhaler when this occurs.  She has had night time awakenings.  She states May was more problematic month for her.  She states April didn't have any issues.  She however has not had UC/ED visits and no systemic steroids.  She is taking Dulera twice a day and spiriva daily.  She is on Fasenra injections every 8 weeks at this time and tolerating injections without large local or systemic symptoms.  She does feel the fasenra has helped with her overall asthma control.    She is having sneezing, runny/stuffy nose, itchy/watery eyes.  She is interested in allergy shots.  She has been taking zyrtec but does not feel it helps.  She tried Careers adviser and it helps a bit more.  She uses flonase and helps a little. She is using air purifiers in the home.     She states her eczema can flare mildly on the arms.  She does not have any ointments any longer (has had triamcinolone and desonide).  Her son has eczema and thus she has  used some of his creams.  She does moisturize with aquafor.    Review of systems: Review of Systems  Constitutional: Negative.   HENT:  Positive for congestion, rhinorrhea and sneezing.   Eyes:  Positive for itching.  Respiratory:  Positive for cough, shortness of breath and wheezing.   Cardiovascular: Negative.   Gastrointestinal: Negative.   Musculoskeletal: Negative.   Skin:  Positive for rash.  Allergic/Immunologic: Negative.   Neurological:  Negative.      All other systems negative unless noted above in HPI  Past medical/social/surgical/family history have been reviewed and are unchanged unless specifically indicated below.  No changes  Medication List: Current Outpatient Medications  Medication Sig Dispense Refill   Benralizumab (FASENRA) 30 MG/ML SOSY Inject 1 mL (30 mg total) into the skin every 28 (twenty-eight) days. For 3 doses then every 8 weeks 1 mL 9   Budeson-Glycopyrrol-Formoterol (BREZTRI AEROSPHERE) 160-9-4.8 MCG/ACT AERO Inhale 2 puffs into the lungs in the morning and at bedtime. 10.7 g 5   cromolyn (OPTICROM) 4 % ophthalmic solution 2 drops each eye up to 4 times a day as needed for itchy/watery eyes. 10 mL 5   desonide (DESOWEN) 0.05 % ointment 1 application 2 times daily as needed, safe to use on face. Do not use for more than 2 weeks in a row. 15 g 5   DYMISTA 137-50 MCG/ACT SUSP 1 spray each nostril twice a day as needed for nasal drainage or congestion 23 g 5   fexofenadine (ALLEGRA) 180 MG tablet Take 180 mg by mouth daily.     levocetirizine (XYZAL) 5 MG tablet Take 1 tablet (5 mg total) by mouth every evening. 30 tablet 5   mometasone-formoterol (DULERA) 200-5 MCG/ACT AERO Inhale 2 puffs into the lungs 2 (two) times daily. 13 g 5   Tiotropium Bromide Monohydrate (SPIRIVA RESPIMAT) 1.25 MCG/ACT AERS Inhale  2 puffs once a day to prevent cough or wheeze. 4 g 5   triamcinolone ointment (KENALOG) 0.1 % 1 application 2 times daily as needed below face and neck, do not use for more than 2 weeks in a row. 30 g 5   montelukast (SINGULAIR) 10 MG tablet Take 1 tablet (10 mg total) by mouth at bedtime. 30 tablet 5   VENTOLIN HFA 108 (90 Base) MCG/ACT inhaler INHALE 2 PUFFS BY MOUTH INTO THE LUNGS EVERY 4 HOURS AS NEEDED FOR WHEEZING OR SHORTNESS OF BREATH 18 g 1   No current facility-administered medications for this visit.     Known medication allergies: Allergies  Allergen Reactions   Lactose Intolerance  (Gi) Hives and Shortness Of Breath   Latex Shortness Of Breath and Rash   Milk (Cow) Hives     Physical examination: Blood pressure 118/68, pulse 90, temperature 98.5 F (36.9 C), temperature source Temporal, resp. rate 18, SpO2 96 %, currently breastfeeding.  General: Alert, interactive, in no acute distress. HEENT: PERRLA, TMs pearly gray, turbinates moderately edematous without discharge, post-pharynx non erythematous. Neck: Supple without lymphadenopathy. Lungs: Mildly decreased breath sounds with expiratory wheezing of left upper and lower lobes. {no increased work of breathing. CV: Normal S1, S2 without murmurs. Abdomen: Nondistended, nontender. Skin: Warm and dry, without lesions or rashes. Extremities:  No clubbing, cyanosis or edema. Neuro:   Grossly intact.  Diagnositics/Labs:  Spirometry: FEV1: 3.06l 123%, FVC: 3.48L 124%, ratio consistent with nonobstructive pattern    Assessment and plan:   Asthma Continue Fasenra injections every 8 weeks.  Will discuss with Tammy, our asthma injectable coodinator, regarding possibility of at home administration.  Continue montelukast 10 mg once a day to prevent cough or wheeze For now continue Dulera 2 puffs twice a day with a spacer AND Spiriva 1.25mcg 2 puffs once a day to prevent cough or wheeze Will see if we can get Breztri inhaler 2 puffs twice a day covered for you which would replace use of Dulera and Spiriva.   Continue albuterol 2 puffs once every 4 hours as needed for cough or wheeze You may use albuterol 2 puffs 5 to 15 minutes before activity to decrease cough or wheeze  Allergic rhinitis and conjunctivtis Continue allergen avoidance measures for pollens, pets, dust mite, mold, and cockroach as listed below Stop Zyrtec Start Xyzal 5mg  daily Start Dymista 1 spray each nostril twice a day as needed for nasal drainage or congestion.  Use Cromolyn 2 drops each eye up to 4 times a day as needed for itchy/watery  eyes Consider allergy shots as a means of long-term control.  Allergy shots "re-train" and "reset" the immune system to ignore environmental allergens and decrease the resulting immune response to those allergens (sneezing, itchy watery eyes, runny nose, nasal congestion, etc).   Allergy shots improve symptoms in 75-85% of patients.   Let us know if you would like to proceed with this therapy.   Atopic dermatitis Continue a daily moisturizing routine For stubborn red, itchy areas below your face, begin triamcinolone 0.1% ointment twice a day as needed.  Do not use this medication for longer than 2 weeks in a row For stubborn red itchy areas on your face, begin desonide 0.05% ointment twice a day as needed.  Do not use this medication for longer than 2 weeks in a row   Follow up in 4-6 month or sooner if needed  I appreciate the opportunity to take part in Kamla's care. Please  do not hesitate to contact me with questions.  Sincerely,   Margo Aye, MD Allergy/Immunology [facility]

## 2023-02-01 ENCOUNTER — Telehealth: Payer: Self-pay

## 2023-02-01 ENCOUNTER — Other Ambulatory Visit: Payer: Self-pay

## 2023-02-01 ENCOUNTER — Other Ambulatory Visit (HOSPITAL_COMMUNITY): Payer: Self-pay

## 2023-02-01 ENCOUNTER — Telehealth: Payer: Self-pay | Admitting: *Deleted

## 2023-02-01 MED ORDER — FASENRA PEN 30 MG/ML ~~LOC~~ SOAJ
30.0000 mg | SUBCUTANEOUS | 6 refills | Status: DC
  Filled 2023-02-01 – 2023-02-13 (×2): qty 1, 56d supply, fill #0
  Filled 2023-04-03: qty 1, 56d supply, fill #1
  Filled 2023-05-17: qty 1, 56d supply, fill #2

## 2023-02-01 NOTE — Telephone Encounter (Signed)
*  Asthma/Allergy  PA request received for Breztri Aerosphere 160-9-4.8MCG/ACT aerosol  PA submitted to [employer]  Medicaid via [facility] and has been APPROVED from 02/01/2023-01/31/2024  Key: [id number]

## 2023-02-01 NOTE — Telephone Encounter (Signed)
-----   Message from [facility] Larose Hires, MD sent at 01/31/2023  6:49 PM EDT ----- Is she eligible for at home fasenra injections?

## 2023-02-01 NOTE — Telephone Encounter (Signed)
L./m for patient to contact to advise change to pen

## 2023-02-01 NOTE — Telephone Encounter (Signed)
Spoke to patient and advised change to pen with instructions on delivery, storage and dosing for same

## 2023-02-04 NOTE — Telephone Encounter (Signed)
Called pharmacy  - spoke to Lomira, Pharmacologist - DOB verified - advised of below notation - reran prescription - approved w/$4 co pay.  Called patient - DOB verified - advised of above notation.  Patient verbalized understanding, no further questions.

## 2023-02-12 ENCOUNTER — Encounter (HOSPITAL_BASED_OUTPATIENT_CLINIC_OR_DEPARTMENT_OTHER): Payer: Self-pay | Admitting: Obstetrics and Gynecology

## 2023-02-12 ENCOUNTER — Other Ambulatory Visit: Payer: Self-pay

## 2023-02-12 NOTE — Progress Notes (Signed)
Spoke w/ via phone for pre-op interview---pt Lab needs dos----  cbc, t & s, upt             Lab results------none COVID test -----patient states asymptomatic no test needed Arrive at -------530 am 03-04-2023 NPO after MN NO Solid Food.  Clear liquids from MN until---430 am Med rec completed Medications to take morning of surgery -----Brezteri, Ventolin inhler prn/bring inhaler Diabetic medication -----n/a Patient instructed no nail polish to be worn day of surgery Patient instructed to bring photo id and insurance card day of surgery Patient aware to have Driver (ride ) / caregiver    sister in law or boyfriend for 24 hours after surgery  Patient Special Instructions -----none Pre-Op special Instructions -----none Patient verbalized understanding of instructions that were given at this phone interview. Patient denies shortness of breath, chest pain, fever, cough at this phone interview.

## 2023-02-13 ENCOUNTER — Other Ambulatory Visit (HOSPITAL_COMMUNITY): Payer: Self-pay

## 2023-02-13 ENCOUNTER — Other Ambulatory Visit: Payer: Self-pay

## 2023-02-15 ENCOUNTER — Other Ambulatory Visit (HOSPITAL_COMMUNITY): Payer: Self-pay

## 2023-02-25 ENCOUNTER — Ambulatory Visit: Payer: Medicaid Other

## 2023-03-01 ENCOUNTER — Encounter (HOSPITAL_BASED_OUTPATIENT_CLINIC_OR_DEPARTMENT_OTHER): Payer: Self-pay | Admitting: Obstetrics and Gynecology

## 2023-03-01 NOTE — Anesthesia Preprocedure Evaluation (Addendum)
Anesthesia Evaluation  Patient identified by MRN, date of birth, ID band Patient awake    Reviewed: Allergy & Precautions, NPO status , Patient's Chart, lab work & pertinent test results  Airway Mallampati: III  TM Distance: >3 FB Neck ROM: Full    Dental no notable dental hx. (+) Teeth Intact, Dental Advisory Given   Pulmonary shortness of breath and with exertion, asthma    Pulmonary exam normal breath sounds clear to auscultation       Cardiovascular negative cardio ROS Normal cardiovascular exam Rhythm:Regular Rate:Normal     Neuro/Psych   Anxiety     negative neurological ROS     GI/Hepatic negative GI ROS, Neg liver ROS,,,  Endo/Other    Morbid obesity  Renal/GU negative Renal ROS  negative genitourinary   Musculoskeletal negative musculoskeletal ROS (+)    Abdominal  (+) + obese  Peds  Hematology negative hematology ROS (+)   Anesthesia Other Findings   Reproductive/Obstetrics Desires sterilization                              Anesthesia Physical Anesthesia Plan  ASA: 3  Anesthesia Plan: General   Post-op Pain Management: Dilaudid IV, Tylenol PO (pre-op)* and Precedex   Induction: Intravenous and Cricoid pressure planned  PONV Risk Score and Plan: 4 or greater and Treatment may vary due to age or medical condition, Midazolam, Ondansetron, Dexamethasone and Scopolamine patch - Pre-op  Airway Management Planned: Oral ETT  Additional Equipment: None  Intra-op Plan:   Post-operative Plan: Extubation in OR  Informed Consent: I have reviewed the patients History and Physical, chart, labs and discussed the procedure including the risks, benefits and alternatives for the proposed anesthesia with the patient or authorized representative who has indicated his/her understanding and acceptance.     Dental advisory given  Plan Discussed with: CRNA and  Anesthesiologist  Anesthesia Plan Comments:          Anesthesia Quick Evaluation

## 2023-03-04 ENCOUNTER — Other Ambulatory Visit: Payer: Self-pay

## 2023-03-04 ENCOUNTER — Ambulatory Visit (HOSPITAL_BASED_OUTPATIENT_CLINIC_OR_DEPARTMENT_OTHER): Payer: Medicaid Other | Admitting: Anesthesiology

## 2023-03-04 ENCOUNTER — Encounter (HOSPITAL_BASED_OUTPATIENT_CLINIC_OR_DEPARTMENT_OTHER)
Admission: RE | Disposition: A | Discharge status: Home / Self Care | Payer: Self-pay | Source: Home / Self Care | Attending: Obstetrics and Gynecology

## 2023-03-04 ENCOUNTER — Ambulatory Visit (HOSPITAL_BASED_OUTPATIENT_CLINIC_OR_DEPARTMENT_OTHER)
Admission: RE | Admit: 2023-03-04 | Discharge: 2023-03-04 | Disposition: A | Discharge status: Home / Self Care | Payer: Medicaid Other | Attending: Obstetrics and Gynecology | Admitting: Obstetrics and Gynecology

## 2023-03-04 ENCOUNTER — Encounter (HOSPITAL_BASED_OUTPATIENT_CLINIC_OR_DEPARTMENT_OTHER): Payer: Self-pay | Admitting: Obstetrics and Gynecology

## 2023-03-04 DIAGNOSIS — Z01818 Encounter for other preprocedural examination: Secondary | ICD-10-CM

## 2023-03-04 DIAGNOSIS — J4551 Severe persistent asthma with (acute) exacerbation: Secondary | ICD-10-CM | POA: Diagnosis not present

## 2023-03-04 DIAGNOSIS — Z302 Encounter for sterilization: Secondary | ICD-10-CM

## 2023-03-04 DIAGNOSIS — Z6841 Body Mass Index (BMI) 40.0 and over, adult: Secondary | ICD-10-CM | POA: Diagnosis not present

## 2023-03-04 DIAGNOSIS — J45909 Unspecified asthma, uncomplicated: Secondary | ICD-10-CM | POA: Diagnosis not present

## 2023-03-04 HISTORY — PX: LAPAROSCOPIC TUBAL LIGATION: SHX1937

## 2023-03-04 HISTORY — DX: Dermatitis, unspecified: L30.9

## 2023-03-04 LAB — CBC
HCT: 40.1 % (ref 36.0–46.0)
Hemoglobin: 12.4 g/dL (ref 12.0–15.0)
MCH: 27.5 pg (ref 26.0–34.0)
MCHC: 30.9 g/dL (ref 30.0–36.0)
MCV: 88.9 fL (ref 80.0–100.0)
Platelets: 343 10*3/uL (ref 150–400)
RBC: 4.51 MIL/uL (ref 3.87–5.11)
RDW: 14.1 % (ref 11.5–15.5)
WBC: 11.3 10*3/uL — ABNORMAL HIGH (ref 4.0–10.5)
nRBC: 0 % (ref 0.0–0.2)

## 2023-03-04 LAB — TYPE AND SCREEN
ABO/RH(D): O POS
Antibody Screen: NEGATIVE

## 2023-03-04 LAB — POCT PREGNANCY, URINE: Preg Test, Ur: NEGATIVE

## 2023-03-04 SURGERY — LIGATION, FALLOPIAN TUBE, LAPAROSCOPIC
Anesthesia: General

## 2023-03-04 MED ORDER — GLYCOPYRROLATE 0.2 MG/ML IJ SOLN
INTRAMUSCULAR | Status: DC | PRN
  Administered 2023-03-04: .2 mg via INTRAVENOUS

## 2023-03-04 MED ORDER — DEXMEDETOMIDINE HCL IN NACL 80 MCG/20ML IV SOLN
INTRAVENOUS | Status: DC | PRN
  Administered 2023-03-04: 8 ug via INTRAVENOUS

## 2023-03-04 MED ORDER — SODIUM CHLORIDE 0.9 % IV SOLN
INTRAVENOUS | Status: DC | PRN
  Administered 2023-03-04: 60 mL

## 2023-03-04 MED ORDER — LIDOCAINE HCL (PF) 2 % IJ SOLN
INTRAMUSCULAR | Status: AC
  Filled 2023-03-04: qty 5

## 2023-03-04 MED ORDER — FENTANYL CITRATE (PF) 100 MCG/2ML IJ SOLN
INTRAMUSCULAR | Status: DC | PRN
  Administered 2023-03-04: 50 ug via INTRAVENOUS
  Administered 2023-03-04: 100 ug via INTRAVENOUS

## 2023-03-04 MED ORDER — PROPOFOL 10 MG/ML IV BOLUS
INTRAVENOUS | Status: DC | PRN
  Administered 2023-03-04: 190 mg via INTRAVENOUS

## 2023-03-04 MED ORDER — MIDAZOLAM HCL 5 MG/5ML IJ SOLN
INTRAMUSCULAR | Status: DC | PRN
  Administered 2023-03-04: 2 mg via INTRAVENOUS

## 2023-03-04 MED ORDER — ROCURONIUM BROMIDE 10 MG/ML (PF) SYRINGE
PREFILLED_SYRINGE | INTRAVENOUS | Status: AC
  Filled 2023-03-04: qty 10

## 2023-03-04 MED ORDER — SOD CITRATE-CITRIC ACID 500-334 MG/5ML PO SOLN: 30.0000 mL | ORAL | Status: DC

## 2023-03-04 MED ORDER — HYDROMORPHONE HCL 1 MG/ML IJ SOLN
0.2500 mg | INTRAMUSCULAR | Status: DC | PRN
  Administered 2023-03-04 (×2): 0.25 mg via INTRAVENOUS

## 2023-03-04 MED ORDER — DEXAMETHASONE SODIUM PHOSPHATE 10 MG/ML IJ SOLN
INTRAMUSCULAR | Status: AC
  Filled 2023-03-04: qty 1

## 2023-03-04 MED ORDER — HYDROMORPHONE HCL 1 MG/ML IJ SOLN
INTRAMUSCULAR | Status: AC
  Filled 2023-03-04: qty 1

## 2023-03-04 MED ORDER — GLYCOPYRROLATE PF 0.2 MG/ML IJ SOSY
PREFILLED_SYRINGE | INTRAMUSCULAR | Status: AC
  Filled 2023-03-04: qty 1

## 2023-03-04 MED ORDER — FENTANYL CITRATE (PF) 100 MCG/2ML IJ SOLN
INTRAMUSCULAR | Status: AC
  Filled 2023-03-04: qty 2

## 2023-03-04 MED ORDER — OXYCODONE HCL 5 MG/5ML PO SOLN: 5.0000 mg | Freq: Once | ORAL | Status: AC | PRN

## 2023-03-04 MED ORDER — POVIDONE-IODINE 10 % EX SWAB: 2.0000 | Freq: Once | CUTANEOUS | Status: DC

## 2023-03-04 MED ORDER — OXYCODONE HCL 5 MG PO TABS
ORAL_TABLET | ORAL | Status: AC
  Filled 2023-03-04: qty 1

## 2023-03-04 MED ORDER — DEXMEDETOMIDINE HCL IN NACL 80 MCG/20ML IV SOLN
INTRAVENOUS | Status: AC
  Filled 2023-03-04: qty 20

## 2023-03-04 MED ORDER — MIDAZOLAM HCL 2 MG/2ML IJ SOLN
INTRAMUSCULAR | Status: AC
  Filled 2023-03-04: qty 2

## 2023-03-04 MED ORDER — SUGAMMADEX SODIUM 200 MG/2ML IV SOLN
INTRAVENOUS | Status: DC | PRN
  Administered 2023-03-04: 200 mg via INTRAVENOUS

## 2023-03-04 MED ORDER — ONDANSETRON HCL 4 MG/2ML IJ SOLN
INTRAMUSCULAR | Status: DC | PRN
Start: 2023-03-04 — End: 2023-03-04
  Administered 2023-03-04: 4 mg via INTRAVENOUS

## 2023-03-04 MED ORDER — OXYCODONE HCL 5 MG PO TABS
5.0000 mg | ORAL_TABLET | Freq: Once | ORAL | Status: AC | PRN
  Administered 2023-03-04: 5 mg via ORAL

## 2023-03-04 MED ORDER — ARTIFICIAL TEARS OPHTHALMIC OINT
TOPICAL_OINTMENT | OPHTHALMIC | Status: AC
  Filled 2023-03-04: qty 3.5

## 2023-03-04 MED ORDER — SUCCINYLCHOLINE CHLORIDE 200 MG/10ML IV SOSY
PREFILLED_SYRINGE | INTRAVENOUS | Status: DC | PRN
  Administered 2023-03-04: 120 mg via INTRAVENOUS

## 2023-03-04 MED ORDER — ONDANSETRON HCL 4 MG/2ML IJ SOLN: 4.0000 mg | Freq: Once | INTRAMUSCULAR | Status: DC | PRN

## 2023-03-04 MED ORDER — ONDANSETRON HCL 4 MG/2ML IJ SOLN
INTRAMUSCULAR | Status: AC
  Filled 2023-03-04: qty 2

## 2023-03-04 MED ORDER — DROPERIDOL 2.5 MG/ML IJ SOLN: 0.6250 mg | Freq: Once | INTRAMUSCULAR | Status: DC | PRN

## 2023-03-04 MED ORDER — SCOPOLAMINE 1 MG/3DAYS TD PT72
1.0000 | MEDICATED_PATCH | TRANSDERMAL | Status: DC
  Administered 2023-03-04: 1.5 mg via TRANSDERMAL

## 2023-03-04 MED ORDER — LACTATED RINGERS IV SOLN: INTRAVENOUS | Status: DC

## 2023-03-04 MED ORDER — ROCURONIUM BROMIDE 10 MG/ML (PF) SYRINGE
PREFILLED_SYRINGE | INTRAVENOUS | Status: DC | PRN
  Administered 2023-03-04: 40 mg via INTRAVENOUS
  Administered 2023-03-04: 10 mg via INTRAVENOUS

## 2023-03-04 MED ORDER — DEXAMETHASONE SODIUM PHOSPHATE 10 MG/ML IJ SOLN
INTRAMUSCULAR | Status: DC | PRN
  Administered 2023-03-04: 10 mg via INTRAVENOUS

## 2023-03-04 MED ORDER — PROPOFOL 10 MG/ML IV BOLUS
INTRAVENOUS | Status: AC
  Filled 2023-03-04: qty 20

## 2023-03-04 MED ORDER — LIDOCAINE 2% (20 MG/ML) 5 ML SYRINGE
INTRAMUSCULAR | Status: DC | PRN
  Administered 2023-03-04: 100 mg via INTRAVENOUS
  Administered 2023-03-04: 60 mg via INTRAVENOUS

## 2023-03-04 MED ORDER — SCOPOLAMINE 1 MG/3DAYS TD PT72
MEDICATED_PATCH | TRANSDERMAL | Status: AC
  Filled 2023-03-04: qty 1

## 2023-03-04 SURGICAL SUPPLY — 27 items
ADH SKN CLS APL DERMABOND .7 (GAUZE/BANDAGES/DRESSINGS) ×1
CATH ROBINSON RED A/P 16FR (CATHETERS) ×1 IMPLANT
CLIP FILSHIE TUBAL LIGA STRL (Clip) ×1 IMPLANT
DERMABOND ADVANCED .7 DNX12 (GAUZE/BANDAGES/DRESSINGS) ×1 IMPLANT
DRAPE SURG IRRIG POUCH 19X23 (DRAPES) ×1 IMPLANT
DRSG OPSITE POSTOP 3X4 (GAUZE/BANDAGES/DRESSINGS) ×1 IMPLANT
DURAPREP 26ML APPLICATOR (WOUND CARE) ×1 IMPLANT
GAUZE 4X4 16PLY ~~LOC~~+RFID DBL (SPONGE) ×1 IMPLANT
GLOVE BIO SURGEON STRL SZ7 (GLOVE) ×2 IMPLANT
GOWN STRL REUS W/TWL LRG LVL3 (GOWN DISPOSABLE) ×3 IMPLANT
IRRIG SUCT STRYKERFLOW 2 WTIP (MISCELLANEOUS) ×1
IRRIGATION SUCT STRKRFLW 2 WTP (MISCELLANEOUS) ×1 IMPLANT
KIT PINK PAD W/HEAD ARE REST (MISCELLANEOUS) ×1
KIT PINK PAD W/HEAD ARM REST (MISCELLANEOUS) ×1 IMPLANT
KIT TURNOVER CYSTO (KITS) ×1 IMPLANT
NDL INSUFFLATION 14GA 120MM (NEEDLE) ×1 IMPLANT
NEEDLE INSUFFLATION 14GA 120MM (NEEDLE) ×1 IMPLANT
PACK LAPAROSCOPY BASIN (CUSTOM PROCEDURE TRAY) ×1 IMPLANT
PROTECTOR NERVE ULNAR (MISCELLANEOUS) ×2 IMPLANT
SET TUBE SMOKE EVAC HIGH FLOW (TUBING) ×1 IMPLANT
SLEEVE SCD COMPRESS KNEE MED (STOCKING) ×1 IMPLANT
SUT VIC AB 2-0 UR6 27 (SUTURE) ×1 IMPLANT
SUT VICRYL RAPIDE 3 0 (SUTURE) ×1 IMPLANT
SYR 50ML LL SCALE MARK (SYRINGE) ×1 IMPLANT
TROCAR Z-THREAD BLADED 11X100M (TROCAR) ×1 IMPLANT
TROCAR Z-THREAD BLADED 5X100MM (TROCAR) IMPLANT
WARMER LAPAROSCOPE (MISCELLANEOUS) ×1 IMPLANT

## 2023-03-04 NOTE — Anesthesia Postprocedure Evaluation (Signed)
Anesthesia Post Note  Patient: Ariel Mooney  Procedure(s) Performed: LAPAROSCOPIC TUBAL LIGATION W/FILSHIE CLIPS     Patient location during evaluation: PACU Anesthesia Type: General Level of consciousness: awake and alert and oriented Pain management: pain level controlled Vital Signs Assessment: post-procedure vital signs reviewed and stable Respiratory status: spontaneous breathing, nonlabored ventilation and respiratory function stable Cardiovascular status: blood pressure returned to baseline and stable Postop Assessment: no apparent nausea or vomiting Anesthetic complications: no   No notable events documented.  Last Vitals:  Vitals:   03/04/23 0915 03/04/23 0949  BP: 112/69 112/85  Pulse:  68  Resp:  16  Temp:  36.5 C  SpO2:  100%    Last Pain:  Vitals:   03/04/23 0935  TempSrc:   PainSc: 6    Pain Goal: Patients Stated Pain Goal: 4 (03/04/23 0935)                 [name]

## 2023-03-04 NOTE — Op Note (Signed)
03/04/2023  8:27 AM  PATIENT:  [name]  22 y.o. female  PRE-OPERATIVE DIAGNOSIS:  sterilization  POST-OPERATIVE DIAGNOSIS:  sterilization  PROCEDURE:  Procedure(s) with comments: LAPAROSCOPIC TUBAL LIGATION W/FILSHIE CLIPS (N/A) - WITH FILSHIE CLIPS  SURGEON:  Surgeons and Role:    * Carrington Clamp, MD - Primary  ANESTHESIA:   general  EBL: <25cc  LOCAL MEDICATIONS USED:  OTHER ropivicaine intraabdominal  SPECIMEN:  No Specimen  DISPOSITION OF SPECIMEN:  N/A  COUNTS:  YES  TOURNIQUET:  * No tourniquets in log *  DICTATION: .Note written in EPIC  PLAN OF CARE: Discharge to home after PACU  PATIENT DISPOSITION:  PACU - hemodynamically stable.   Delay start of Pharmacological VTE agent (>24hrs) due to surgical blood loss or risk of bleeding: not applicable Meds: none  Complications:  none  Findings: normal uterus, tubes and ovaries.  Technique  After adequate general endotracheal anesthesia was achieved, the patient was prepped and draped in the usual sterile fashion.  A 2 cm incision was made just below the umbilicus and the abdominal wall tented up.  The Veress needle was inserted at a 45 degree angle to the pelvis and no bowel contents or blood were aspirated.  The abdomen was insufflated and the 12 mm trocar placed without complication.  The operative scope was introduced and the above findings noted.  The filchie clip instrument was introduced and the tube were followed to their fimbriated ends bilaterally.  A filchie clip was placed on the isthmic portion of each tube and confirmed visually to be around the entire circumference of each tube.  After a quick inspection of the pelvis and liver edge, all instruments were removed and the abdomen was desufflated.  The 12 mm faschial incision was closed with a figure of eight stitch of 2-vicryl and the skin was closed with dermabond.  All instruments were removed from the vagina and the patient returned to the  PACU in stable condition.  Jacai Kipp A

## 2023-03-04 NOTE — Transfer of Care (Signed)
Immediate Anesthesia Transfer of Care Note  Patient: Ariel Mooney  Procedure(s) Performed: LAPAROSCOPIC TUBAL LIGATION W/FILSHIE CLIPS  Patient Location: PACU  Anesthesia Type:General  Level of Consciousness: awake, alert , oriented, and patient cooperative  Airway & Oxygen Therapy: Patient Spontanous Breathing  Post-op Assessment: Report given to RN and Post -op Vital signs reviewed and stable  Post vital signs: Reviewed and stable  Last Vitals:  Vitals Value Taken Time  BP 121/62 03/04/23 0840  Temp    Pulse 70 03/04/23 0843  Resp 18 03/04/23 0843  SpO2 100 % 03/04/23 0843  Vitals shown include unfiled device data.  Last Pain:  Vitals:   03/04/23 0604  TempSrc: Oral  PainSc: 0-No pain      Patients Stated Pain Goal: 4 (03/04/23 0604)  Complications: No notable events documented.

## 2023-03-04 NOTE — H&P (Signed)
22 y.o. [id number] desires permanent sterilization.  Past Medical History:  Diagnosis Date   Abdominal pain, recurrent    Anxiety    Asthma    since high school not good control   Blood transfusion without reported diagnosis    Eczema    Gall stones    Intrinsic atopic dermatitis 02/09/2021   Past Surgical History:  Procedure Laterality Date   CHOLECYSTECTOMY N/A 03/28/2022   Procedure: LAPAROSCOPIC CHOLECYSTECTOMY;  Surgeon: Axel Filler, MD;  Location: [facility];  Service: General;  Laterality: N/A;   TONSILLECTOMY  08/20/2008   VAGINAL DELIVERY N/A 03/01/2022   Procedure: VAGINAL DELIVERY;  Surgeon: Carrington Clamp, MD;  Location: [facility] LD ORS;  Service: Obstetrics;  Laterality: N/A;   WISDOM TOOTH EXTRACTION      Social History   Socioeconomic History   Marital status: Single    Spouse name: Not on file   Number of children: Not on file   Years of education: Not on file   Highest education level: Not on file  Occupational History   Not on file  Tobacco Use   Smoking status: Never   Smokeless tobacco: Never  Vaping Use   Vaping status: Never Used  Substance and Sexual Activity   Alcohol use: No   Drug use: No   Sexual activity: Yes    Birth control/protection: None  Other Topics Concern   Not on file  Social History Narrative   Lives in her own home with her boyfriend and soon to be baby.   Social Determinants of Health   Financial Resource Strain: Not on file  Food Insecurity: No Food Insecurity (09/09/2020)   Received from [facility]   Hunger Vital Sign    Worried About Running Out of Food in the Last Year: Never true    Ran Out of Food in the Last Year: Never true  Transportation Needs: Not on file  Physical Activity: Not on file  Stress: Not on file  Social Connections: Unknown (01/01/2022)   Received from [facility], [facility]   Social Network    Social Network: Not on file  Intimate Partner Violence: Unknown (11/23/2021)    Received from [facility], [facility]   HITS    Physically Hurt: Not on file    Insult or Talk Down To: Not on file    Threaten Physical Harm: Not on file    Scream or Curse: Not on file    No current facility-administered medications on file prior to encounter.   No current outpatient medications on file prior to encounter.    Allergies  Allergen Reactions   Latex Shortness Of Breath and Rash    Vitals:   02/12/23 1223 03/04/23 0604  BP:  130/68  Pulse:  63  Resp:  17  Temp:  97.8 F (36.6 C)  TempSrc:  Oral  SpO2:  97%  Weight: 108 kg 111 kg  Height: 5\' 1"  (1.549 m) 5\' 1"  (1.549 m)    Lungs: clear to ascultation Cor:  RRR Abdomen:  soft, nontender, nondistended. Ex:  no cords, erythema Pelvic:  NOrmal anatomy  A:  For filschie clip btl.   P: P: All risks, benefits and alternatives d/w patient and she desires to proceed.  Patient has undergone an ERAS protocol and will receive SCDs during the operation.   Loney Laurence

## 2023-03-04 NOTE — Brief Op Note (Signed)
03/04/2023  8:27 AM  PATIENT:  Ariel Mooney  22 y.o. female  PRE-OPERATIVE DIAGNOSIS:  sterilization  POST-OPERATIVE DIAGNOSIS:  sterilization  PROCEDURE:  Procedure(s) with comments: LAPAROSCOPIC TUBAL LIGATION W/FILSHIE CLIPS (N/A) - WITH FILSHIE CLIPS  SURGEON:  Surgeons and Role:    * Carrington Clamp, MD - Primary  ANESTHESIA:   general  EBL: <25cc  LOCAL MEDICATIONS USED:  OTHER ropivicaine intraabdominal  SPECIMEN:  No Specimen  DISPOSITION OF SPECIMEN:  N/A  COUNTS:  YES  TOURNIQUET:  * No tourniquets in log *  DICTATION: .Note written in EPIC  PLAN OF CARE: Discharge to home after PACU  PATIENT DISPOSITION:  PACU - hemodynamically stable.   Delay start of Pharmacological VTE agent (>24hrs) due to surgical blood loss or risk of bleeding: not applicable

## 2023-03-04 NOTE — Discharge Instructions (Addendum)
Percocet sent to pharm via [facility]   DISCHARGE INSTRUCTIONS: Laparoscopy  The following instructions have been prepared to help you care for yourself upon your return home today.  Wound care:  Do not get the incision wet for the first 24 hours. The incision should be kept clean and dry.  The Band-Aids or dressings may be removed the day after surgery.  Should the incision become sore, red, and swollen after the first week, check with your doctor.  Personal hygiene:  Shower the day after your procedure.  Activity and limitations:  Do NOT drive or operate any equipment today.  Do NOT lift anything more than 15 pounds for 2-3 weeks after surgery.  Do NOT rest in bed all day.  Walking is encouraged. Walk each day, starting slowly with 5-minute walks 3 or 4 times a day. Slowly increase the length of your walks.  Walk up and down stairs slowly.  Do NOT do strenuous activities, such as golfing, playing tennis, bowling, running, biking, weight lifting, gardening, mowing, or vacuuming for 2-4 weeks. Ask your doctor when it is okay to start.  Diet: Eat a light meal as desired this evening. You may resume your usual diet tomorrow.  Return to work: This is dependent on the type of work you do. For the most part you can return to a desk job within a week of surgery. If you are more active at work, please discuss this with your doctor.  What to expect after your surgery: You may have a slight burning sensation when you urinate on the first day. You may have a very small amount of blood in the urine. Expect to have a small amount of vaginal discharge/light bleeding for 1-2 weeks. It is not unusual to have abdominal soreness and bruising for up to 2 weeks. You may be tired and need more rest for about 1 week. You may experience shoulder pain for 24-72 hours. Lying flat in bed may relieve it.  Call your doctor for any of the following:  Develop a fever of 100.4 or greater  Inability to urinate 6 hours  after discharge from hospital  Severe pain not relieved by pain medications  Persistent of heavy bleeding at incision site  Redness or swelling around incision site after a week  Increasing nausea or vomiting    Post Anesthesia Home Care Instructions  Activity: Get plenty of rest for the remainder of the day. A responsible individual must stay with you for 24 hours following the procedure.  For the next 24 hours, DO NOT: -Drive a car -Advertising copywriter -Drink alcoholic beverages -Take any medication unless instructed by your physician -Make any legal decisions or sign important papers.  Meals: Start with liquid foods such as gelatin or soup. Progress to regular foods as tolerated. Avoid greasy, spicy, heavy foods. If nausea and/or vomiting occur, drink only clear liquids until the nausea and/or vomiting subsides. Call your physician if vomiting continues.  Special Instructions/Symptoms: Your throat may feel dry or sore from the anesthesia or the breathing tube placed in your throat during surgery. If this causes discomfort, gargle with warm salt water. The discomfort should disappear within 24 hours.  If you had a scopolamine patch placed behind your ear for the management of post- operative nausea and/or vomiting:  1. The medication in the patch is effective for 72 hours, after which it should be removed.  Wrap patch in a tissue and discard in the trash. Wash hands thoroughly with soap and water.  2. You may remove the patch earlier than 72 hours if you experience unpleasant side effects which may include dry mouth, dizziness or visual disturbances. 3. Avoid touching the patch. Wash your hands with soap and water after contact with the patch.

## 2023-03-05 ENCOUNTER — Encounter (HOSPITAL_BASED_OUTPATIENT_CLINIC_OR_DEPARTMENT_OTHER): Payer: Self-pay | Admitting: Obstetrics and Gynecology

## 2023-03-08 ENCOUNTER — Ambulatory Visit: Payer: Medicaid Other | Admitting: *Deleted

## 2023-03-08 DIAGNOSIS — J4550 Severe persistent asthma, uncomplicated: Secondary | ICD-10-CM

## 2023-03-08 NOTE — Progress Notes (Signed)
Ariel Mooney was instructed how to self administer her Harrington Challenger and will now continue her injections at home.

## 2023-03-11 ENCOUNTER — Encounter (HOSPITAL_BASED_OUTPATIENT_CLINIC_OR_DEPARTMENT_OTHER): Payer: Self-pay | Admitting: Obstetrics and Gynecology

## 2023-04-03 ENCOUNTER — Other Ambulatory Visit (HOSPITAL_COMMUNITY): Payer: Self-pay

## 2023-04-05 ENCOUNTER — Ambulatory Visit: Payer: Medicaid Other

## 2023-05-17 ENCOUNTER — Other Ambulatory Visit: Payer: Self-pay | Admitting: Pharmacy Technician

## 2023-05-17 ENCOUNTER — Other Ambulatory Visit: Payer: Self-pay

## 2023-05-17 NOTE — Progress Notes (Signed)
Specialty Pharmacy Refill Coordination Note  Ariel Mooney is a 22 y.o. female contacted today regarding refills of specialty medication(s) Benralizumab .  Patient requested Delivery  on 05/28/23  to verified address [address] [place]   Medication will be filled on 05/27/23.

## 2023-06-06 ENCOUNTER — Ambulatory Visit: Payer: Medicaid Other | Admitting: Allergy

## 2023-06-11 ENCOUNTER — Other Ambulatory Visit: Payer: Self-pay | Admitting: Allergy

## 2023-06-16 ENCOUNTER — Other Ambulatory Visit: Payer: Self-pay

## 2023-06-16 ENCOUNTER — Emergency Department (HOSPITAL_BASED_OUTPATIENT_CLINIC_OR_DEPARTMENT_OTHER): Payer: Medicaid Other

## 2023-06-16 ENCOUNTER — Encounter (HOSPITAL_BASED_OUTPATIENT_CLINIC_OR_DEPARTMENT_OTHER): Payer: Self-pay

## 2023-06-16 ENCOUNTER — Emergency Department (HOSPITAL_BASED_OUTPATIENT_CLINIC_OR_DEPARTMENT_OTHER)
Admission: EM | Admit: 2023-06-16 | Discharge: 2023-06-16 | Disposition: A | Discharge status: Home / Self Care | Payer: Medicaid Other

## 2023-06-16 DIAGNOSIS — J4541 Moderate persistent asthma with (acute) exacerbation: Secondary | ICD-10-CM | POA: Insufficient documentation

## 2023-06-16 DIAGNOSIS — Z91040 Latex allergy status: Secondary | ICD-10-CM | POA: Diagnosis not present

## 2023-06-16 DIAGNOSIS — R0602 Shortness of breath: Secondary | ICD-10-CM | POA: Diagnosis present

## 2023-06-16 DIAGNOSIS — J45901 Unspecified asthma with (acute) exacerbation: Secondary | ICD-10-CM

## 2023-06-16 MED ORDER — PREDNISONE 10 MG PO TABS: 40.0000 mg | ORAL_TABLET | Freq: Every day | ORAL | 0 refills | Status: AC

## 2023-06-16 MED ORDER — ALBUTEROL SULFATE (2.5 MG/3ML) 0.083% IN NEBU: 2.5000 mg | INHALATION_SOLUTION | Freq: Once | RESPIRATORY_TRACT | Status: AC

## 2023-06-16 MED ORDER — ALBUTEROL SULFATE (2.5 MG/3ML) 0.083% IN NEBU
INHALATION_SOLUTION | RESPIRATORY_TRACT | Status: AC
  Administered 2023-06-16: 2.5 mg via RESPIRATORY_TRACT
  Filled 2023-06-16: qty 3

## 2023-06-16 MED ORDER — METHYLPREDNISOLONE SODIUM SUCC 125 MG IJ SOLR
125.0000 mg | Freq: Once | INTRAMUSCULAR | Status: AC
  Administered 2023-06-16: 125 mg via INTRAVENOUS
  Filled 2023-06-16: qty 2

## 2023-06-16 MED ORDER — IPRATROPIUM-ALBUTEROL 0.5-2.5 (3) MG/3ML IN SOLN: 3.0000 mL | Freq: Once | RESPIRATORY_TRACT | Status: AC

## 2023-06-16 MED ORDER — IPRATROPIUM-ALBUTEROL 0.5-2.5 (3) MG/3ML IN SOLN
RESPIRATORY_TRACT | Status: AC
  Administered 2023-06-16: 3 mL via RESPIRATORY_TRACT
  Filled 2023-06-16: qty 3

## 2023-06-16 MED ORDER — MAGNESIUM SULFATE 50 % IJ SOLN
2.0000 g | Freq: Once | INTRAMUSCULAR | Status: AC
  Administered 2023-06-16: 2 g via INTRAVENOUS
  Filled 2023-06-16: qty 4

## 2023-06-16 MED ORDER — IPRATROPIUM-ALBUTEROL 20-100 MCG/ACT IN AERS
1.0000 | INHALATION_SPRAY | Freq: Four times a day (QID) | RESPIRATORY_TRACT | Status: DC
  Administered 2023-06-16: 1 via RESPIRATORY_TRACT
  Filled 2023-06-16: qty 4

## 2023-06-16 MED ORDER — ALBUTEROL SULFATE HFA 108 (90 BASE) MCG/ACT IN AERS: 2.0000 | INHALATION_SPRAY | RESPIRATORY_TRACT | Status: DC | PRN

## 2023-06-16 NOTE — ED Triage Notes (Signed)
Pt c/o SHOB, "first felt it like a week ago, yesterday & had a lot of wheezing & couldn't catch my breath; today I've not been able to stop it." Compliant w home meds/ tx, but no relief.   States it feels similar to previous asthma attacks. Last meds just PTA

## 2023-06-16 NOTE — ED Provider Notes (Signed)
[place] EMERGENCY DEPARTMENT AT [facility] Provider Note   CSN: [id number] Arrival date & time: 06/16/23  1947     History  Chief Complaint  Patient presents with   Shortness of Breath    Ariel Mooney is a 22 y.o. female.  22 year old female history of asthma presenting the emergency department for shortness of breath.  Feels similar to her prior asthma exacerbations she has had some URI symptoms for the past several days.  She has tried her home albuterol with little improvement.  Received DuoNeb from respiratory/triage prior to my arrival reports some mild improvement.   Shortness of Breath      Home Medications Prior to Admission medications   Medication Sig Start Date End Date Taking? Authorizing Provider  predniSONE (DELTASONE) 10 MG tablet Take 4 tablets (40 mg total) by mouth daily for 3 days. 06/17/23 06/20/23 Yes Ala Capri J, DO  VENTOLIN HFA 108 (90 Base) MCG/ACT inhaler INHALE 2 PUFFS BY MOUTH EVERY 4 HOURS AS NEEDED FOR WHEEZING OR SHORTNESS OF BREATH 06/11/23   Padgett, Pilar Grammes, MD  benralizumab [facility] PEN) 30 MG/ML prefilled autoinjector Inject 1 mL (30 mg total) into the skin every 8 (eight) weeks. 02/01/23   Marcelyn Bruins, MD  Budeson-Glycopyrrol-Formoterol (BREZTRI AEROSPHERE) 160-9-4.8 MCG/ACT AERO Inhale 2 puffs into the lungs in the morning and at bedtime. 01/31/23   Marcelyn Bruins, MD  cromolyn (OPTICROM) 4 % ophthalmic solution 2 drops each eye up to 4 times a day as needed for itchy/watery eyes. 01/31/23   Marcelyn Bruins, MD  DYMISTA [phone] MCG/ACT SUSP 1 spray each nostril twice a day as needed for nasal drainage or congestion 01/31/23   Marcelyn Bruins, MD  levocetirizine (XYZAL) 5 MG tablet Take 1 tablet (5 mg total) by mouth every evening. 01/31/23   Marcelyn Bruins, MD  montelukast (SINGULAIR) 10 MG tablet Take 1 tablet (10 mg total) by mouth at bedtime. 01/31/23    Marcelyn Bruins, MD      Allergies    Latex    Review of Systems   Review of Systems  Respiratory:  Positive for shortness of breath.     Physical Exam Updated Vital Signs BP 124/77   Pulse (!) 111   Temp 98.2 F (36.8 C) (Oral)   Resp (!) 24   SpO2 (!) 83%  Physical Exam Vitals reviewed.  Constitutional:      Appearance: She is obese.  Pulmonary:     Effort: Accessory muscle usage present. No respiratory distress.     Breath sounds: Wheezing present.  Musculoskeletal:     Right lower leg: No edema.     Left lower leg: No edema.  Skin:    General: Skin is warm.  Neurological:     Mental Status: She is alert.     ED Results / Procedures / Treatments   Labs (all labs ordered are listed, but only abnormal results are displayed) Labs Reviewed - No data to display  EKG EKG Interpretation Date/Time:  Sunday June 16 2023 20:03:34 EDT Ventricular Rate:  96 PR Interval:  144 QRS Duration:  94 QT Interval:  356 QTC Calculation: 449 R Axis:   61  Text Interpretation: Normal sinus rhythm Normal ECG When compared with ECG of 07-Jul-2014 02:12, PREVIOUS ECG IS PRESENT Confirmed by Estanislado Pandy [phone]) on 06/16/2023 8:44:07 PM  Radiology DG Chest Port 1 View  Result Date: 06/16/2023 CLINICAL DATA:  10026 Shortness of breath 10026 EXAM: PORTABLE  CHEST 1 VIEW COMPARISON:  Dec 24, 2013 FINDINGS: The cardiomediastinal silhouette is unchanged in contour. No pleural effusion. No pneumothorax. No acute pleuroparenchymal abnormality. Mildly prominent bronchitic markings. IMPRESSION: No acute cardiopulmonary abnormality. Mildly prominent bronchitic markings consistent with history of asthma. Electronically Signed   By: Meda Klinefelter M.D.   On: 06/16/2023 20:28    Procedures Procedures    Medications Ordered in ED Medications  albuterol (VENTOLIN HFA) 108 (90 Base) MCG/ACT inhaler 2 puff (has no administration in time range)  Ipratropium-Albuterol  (COMBIVENT) respimat 1 puff (1 puff Inhalation Given 06/16/23 2233)  ipratropium-albuterol (DUONEB) 0.5-2.5 (3) MG/3ML nebulizer solution 3 mL (3 mLs Nebulization Given 06/16/23 2033)  albuterol (PROVENTIL) (2.5 MG/3ML) 0.083% nebulizer solution 2.5 mg (2.5 mg Nebulization Given 06/16/23 2019)  magnesium sulfate (IV Push/IM) injection 2 g (2 g Intravenous Given 06/16/23 2058)  methylPREDNISolone sodium succinate (SOLU-MEDROL) 125 mg/2 mL injection 125 mg (125 mg Intravenous Given 06/16/23 2058)    ED Course/ Medical Decision Making/ A&P Clinical Course as of 06/16/23 2330  Sun Jun 16, 2023  2217 Patient reevaluated.  Is feeling better and willing to go home.  She has follow-up with her doctor tomorrow.  Will discharge with short course of steroids. [TY]    Clinical Course User Index [TY] Coral Spikes, DO                                 Medical Decision Making So well-appearing 22 year old female with history of asthma presenting emergency department for shortness of breath in the setting of asthma.  Received breathing treatments prior to my arrival with some improvement of her symptoms.  Was given further dose of Solu-Medrol and mag with even greater improvement of her symptoms.  She does not appear to be in respiratory distress, speaking in full sentences.  Not hypoxic.  Chest x-ray ordered by triage independent reviewed by myself no overt pneumonia.  EKG sinus with no ST segment change indicate ischemia.  Will discharge to see PCP.  Amount and/or Complexity of Data Reviewed Independent Historian:     Details: Mother notes prior hospitalization when she was a teenager External Data Reviewed: notes.    Details: Per allergist note :"Asthma Continue Fasenra injections every 8 weeks.  Will discuss with Tammy, our asthma injectable coodinator, regarding possibility of at home administration.  Continue montelukast 10 mg once a day to prevent cough or wheeze For now continue Dulera 2  puffs twice a day with a spacer AND Spiriva 1.54mcg 2 puffs once a day to prevent cough or wheeze Will see if we can get Breztri inhaler 2 puffs twice a day covered for you which would replace use of Dulera and Spiriva.   Continue albuterol 2 puffs once every 4 hours as needed for cough or wheeze You may use albuterol 2 puffs 5 to 15 minutes before activity to decrease cough or wheeze " Labs:     Details: Consider labs however presentation consistent with acute asthma exacerbation; low suspicion for infection or metabolic derangements. Radiology: ordered.  Risk Prescription drug management.           Final Clinical Impression(s) / ED Diagnoses Final diagnoses:  Moderate asthma with exacerbation, unspecified whether persistent    Rx / DC Orders ED Discharge Orders          Ordered    predniSONE (DELTASONE) 10 MG tablet  Daily  06/16/23 2218              Coral Spikes, DO 06/16/23 2330

## 2023-06-16 NOTE — ED Notes (Signed)
RT assessed pt in triage for SOB d/t asthma. 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 Pt given 5/5 treatment at this time w/improvement of BLBS at this time. RT will continue to monitor while at [facility].    06/16/23 2022  Therapy Vitals  Pulse Rate 100  Resp 18  MEWS Score/Color  MEWS Score 0  MEWS Score Color Green  Respiratory Assessment  Assessment Type Pre-treatment  Respiratory Pattern Regular;Labored;Dyspnea at rest;Symmetrical  Chest Assessment Chest expansion symmetrical  Cough None  Bilateral Breath Sounds Diminished  R Upper  Breath Sounds Diminished  L Upper Breath Sounds Diminished  R Lower Breath Sounds Diminished  L Lower Breath Sounds Diminished  Oxygen Therapy/Pulse Ox  O2 Device Room Air  O2 Therapy Room air  SpO2 96 %

## 2023-06-16 NOTE — ED Notes (Signed)
Pt educated on proper use of MDI Combivent (no spacer required). Pt able to perform w/out difficulty. Pt verbalizes understanding.    06/16/23 2234  Aerosol Therapy Tx  $ Hand Held Nebulizer  1  Medications Combivent  Delivery Device MDI  Pre-Treatment Pulse 96  Pre-Treatment Respirations 14  Treatment Tolerance Tolerated well  Treatment Given 1  MEWS Score/Color  MEWS Score 1  MEWS Score Color Green  RT Breath Sounds  Bilateral Breath Sounds Clear  R Upper  Breath Sounds Clear  L Upper Breath Sounds Clear  R Lower Breath Sounds Clear  L Lower Breath Sounds Clear  Oxygen Therapy/Pulse Ox  O2 Device Room Air  O2 Therapy Room air

## 2023-06-16 NOTE — Discharge Instructions (Signed)
Please follow-up with your primary doctor.  Patient medications were prescribed.  Return immediately felt fevers, chills, worsening shortness of breath.

## 2023-06-17 ENCOUNTER — Other Ambulatory Visit: Payer: Self-pay

## 2023-06-17 ENCOUNTER — Ambulatory Visit (INDEPENDENT_AMBULATORY_CARE_PROVIDER_SITE_OTHER): Payer: Medicaid Other | Admitting: Family Medicine

## 2023-06-17 ENCOUNTER — Encounter: Payer: Self-pay | Admitting: Family Medicine

## 2023-06-17 ENCOUNTER — Telehealth: Payer: Self-pay

## 2023-06-17 VITALS — BP 110/60 | HR 86 | Temp 98.4°F | Resp 16 | Wt 244.9 lb

## 2023-06-17 DIAGNOSIS — L2084 Intrinsic (allergic) eczema: Secondary | ICD-10-CM

## 2023-06-17 DIAGNOSIS — J4550 Severe persistent asthma, uncomplicated: Secondary | ICD-10-CM | POA: Diagnosis not present

## 2023-06-17 DIAGNOSIS — H1010 Acute atopic conjunctivitis, unspecified eye: Secondary | ICD-10-CM

## 2023-06-17 DIAGNOSIS — J302 Other seasonal allergic rhinitis: Secondary | ICD-10-CM

## 2023-06-17 DIAGNOSIS — H1013 Acute atopic conjunctivitis, bilateral: Secondary | ICD-10-CM | POA: Diagnosis not present

## 2023-06-17 DIAGNOSIS — J3089 Other allergic rhinitis: Secondary | ICD-10-CM | POA: Diagnosis not present

## 2023-06-17 MED ORDER — AIRSUPRA 90-80 MCG/ACT IN AERO: 2.0000 | INHALATION_SPRAY | RESPIRATORY_TRACT | 1 refills | Status: DC | PRN

## 2023-06-17 MED ORDER — BREZTRI AEROSPHERE 160-9-4.8 MCG/ACT IN AERO: 2.0000 | INHALATION_SPRAY | Freq: Two times a day (BID) | RESPIRATORY_TRACT | 5 refills | Status: DC

## 2023-06-17 NOTE — Telephone Encounter (Signed)
Per Thurston Hole:  Refer to pulmonary specialist for full PFT testing.

## 2023-06-17 NOTE — Progress Notes (Cosign Needed)
[address] [place] [address] Dept: [phone]  FOLLOW UP NOTE  Patient ID: Ariel Mooney, female    DOB: July 14, 2001  Age: 22 y.o. MRN: [id number] Date of Office Visit: 06/17/2023  Assessment  Chief Complaint: Follow-up (ER follow up. Wants to talk about referral to pulmonology for testing, change in medication (respimat inhaler was given in hospital), retest allergies to see if there are triggers from mold, and cockroaches).) and Asthma (Flaring up more lately. Went to ER yesterday with asthma flare. Wheezing, tightness in chest, pain.)  HPI Ariel Mooney is a 22 year old female who presents to the clinic for follow-up visit.  She was last seen in this clinic on 01/31/2023 by Dr. [name] for evaluation of asthma, allergic rhinitis, allergic conjunctivitis, and atopic dermatitis.  In the interim, she visited the emergency department yesterday for evaluation of asthma with acute exacerbation for which she received albuterol, ipratropium, magnesium sulfate, and Solu-Medrol and was provided with a prednisone taper prescription.  Discussed the use of AI scribe software for clinical note transcription with the patient, who gave verbal consent to proceed.  History of Present Illness   The patient, with a history of asthma, reports a significant increase in asthma flares over the past two months. She reports experiencing shortness of breath, wheezing, and chest tightness, both at rest and with activity. These symptoms have been severe enough to wake her from sleep and limit her daily activities. The patient describes the chest tightness as the initial symptom, followed by a sensation of heaviness in the chest, gasping for air, and wheezing. These episodes typically last from thirty minutes to an hour, but a recent episode lasted for two hours and required an emergency room visit.  The patient has been managing these flares with albuterol and a nebulizer machine, but reports that  these interventions have been increasingly ineffective.  She reports symptoms including shortness of breath, wheeze, and dry cough with activity or rest which is worsened over the last 2 months.  She has been taking Breztri 2 puffs once daily in the morning, Fasenra injections every eight weeks, and montelukast daily.  She has received her fourth dose of Fasenra and reports that the first 3 doses that were given monthly were controlling her asthma well.  She reports that she began to experience asthma symptoms before the fourth dose given at the 8-week interval.  Despite these interventions, the patient reports that their asthma symptoms have been uncontrolled.  Chart review indicates she last received Breztri from the pharmacy on 04/03/2023.  The patient has noticed a correlation between their worsening asthma symptoms and environmental issues at her current residence.  She has been living in a rental property for the past year, where they have noticed a water leak in the living room for the past month. They suspect this has led to mold or mildew growth. Additionally, they have noticed an influx of outdoor cockroaches entering the home through unconnected vents. The patient is planning to move out of this environment within the week.  Allergic rhinitis is reported as poorly controlled with symptoms including clear rhinorrhea, nasal congestion, and sneezing which have been ongoing for the past two months.  She has been using Flonase and taking Xyzal for these symptoms with improvement. The patient denies any fever, sweats, or chills.  She has not required antibiotics for any respiratory infections since her last visit to this clinic.  Atopic dermatitis is reported as moderately well-controlled with occasional red and itchy  areas occurring in a flare in remission pattern.  She continues a twice a day moisturizing routine and infrequently needs to use triamcinolone for relief of symptoms.  Her current  medications are listed in the chart.  Drug Allergies:  Allergies  Allergen Reactions   Latex Shortness Of Breath and Rash    Physical Exam: BP 110/60   Pulse 86   Temp 98.4 F (36.9 C) (Temporal)   Resp 16   Wt 244 lb 14.4 oz (111.1 kg)   SpO2 97%   BMI 46.27 kg/m    Physical Exam Vitals reviewed.  Constitutional:      Appearance: Normal appearance.  HENT:     Head: Normocephalic and atraumatic.     Right Ear: Tympanic membrane normal.     Left Ear: Tympanic membrane normal.     Nose:     Comments: Bilateral naris slightly erythematous with thin clear nasal drainage noted.  Pharynx normal.  Ears normal.  Eyes normal.    Mouth/Throat:     Pharynx: Oropharynx is clear.  Eyes:     Conjunctiva/sclera: Conjunctivae normal.  Cardiovascular:     Rate and Rhythm: Normal rate and regular rhythm.     Heart sounds: Normal heart sounds. No murmur heard. Pulmonary:     Effort: Pulmonary effort is normal.     Comments: Lungs clear to auscultation Musculoskeletal:        General: Normal range of motion.     Cervical back: Normal range of motion and neck supple.  Skin:    General: Skin is warm and dry.  Neurological:     Mental Status: She is alert and oriented to person, place, and time.  Psychiatric:        Mood and Affect: Mood normal.        Behavior: Behavior normal.        Thought Content: Thought content normal.        Judgment: Judgment normal.     Diagnostics: FVC 3.52 which is 125% predicted value, FEV1 3.11 which is 124% of predicted value.  Spirometry indicates normal ventilatory function.  Patient continuing on prednisone taper received today emergency department.  Assessment and Plan: 1. Not well controlled severe persistent asthma   2. Seasonal and perennial allergic rhinitis   3. Seasonal allergic conjunctivitis   4. Intrinsic atopic dermatitis     Meds ordered this encounter  Medications   Albuterol-Budesonide (AIRSUPRA) 90-80 MCG/ACT AERO    Sig:  Inhale 2 puffs into the lungs as needed.    Dispense:  10.7 g    Refill:  1    HQI:[id number] MWU:[id number] MWN:[id number] [id number]   Budeson-Glycopyrrol-Formoterol (BREZTRI AEROSPHERE) 160-9-4.8 MCG/ACT AERO    Sig: Inhale 2 puffs into the lungs in the morning and at bedtime.    Dispense:  10.7 g    Refill:  5    Patient Instructions  Asthma Increase Breztri to 2 puffs twice a day with a spacer to prevent cough or wheeze Continue montelukast 10 mg once a day to prevent cough or wheeze Begin AirSupra 2 puffs as needed. Do not use more than 12 puffs in one 24 hour period.  We will submit your Dupixent. Do not stop Harrington Challenger until you hear from our Biologics Coordinator Tammy.  Refer to pulmonary specialist for full PFT testing  Allergic rhinitis Continue allergen avoidance measures directed toward pollens, pets, dust mite, mold, and cockroach as listed below Continue cetirizine 10 mg once a day as needed for runny nose  or itch. Remember to rotate to a different antihistamine about every 3 months. Some examples of over the counter antihistamines include Zyrtec (cetirizine), Xyzal (levocetirizine), Allegra (fexofenadine), and Claritin (loratidine).  Continue Flonase 2 sprays in each nostril once a day as needed for a stuffy nose Consider saline nasal rinses as needed for nasal symptoms. Use this before any medicated nasal sprays for best result  Allergic conjunctivitis Some over the counter eye drops include Pataday one drop in each eye once a day as needed for red, itchy eyes OR Zaditor one drop in each eye twice a day as needed for red itchy eyes.  Atopic dermatitis Continue a daily moisturizing routine For stubborn red, itchy areas below your face, begin triamcinolone 0.1% ointment twice a day as needed.  Do not use this medication for longer than 2 weeks in a row For stubborn red itchy areas on your face, begin desonide 0.05% ointment twice a day as needed.  Do not use this medication for  longer than 2 weeks in a row  Call the clinic if this treatment plan is not working well for you  Follow up in 1 month or sooner if needed.   Return in about 4 weeks (around 07/15/2023), or if symptoms worsen or fail to improve.    Thank you for the opportunity to care for this patient.  Please do not hesitate to contact me with questions.  Thermon Leyland, FNP [facility]

## 2023-06-17 NOTE — Patient Instructions (Addendum)
Asthma Increase Breztri to 2 puffs twice a day with a spacer to prevent cough or wheeze Continue montelukast 10 mg once a day to prevent cough or wheeze Begin AirSupra 2 puffs as needed. Do not use more than 12 puffs in one 24 hour period.  We will submit your Dupixent. Do not stop Harrington Challenger until you hear from our Biologics Coordinator Tammy.  Refer to pulmonary specialist for full PFT testing  Allergic rhinitis Continue allergen avoidance measures directed toward pollens, pets, dust mite, mold, and cockroach as listed below Continue cetirizine 10 mg once a day as needed for runny nose or itch. Remember to rotate to a different antihistamine about every 3 months. Some examples of over the counter antihistamines include Zyrtec (cetirizine), Xyzal (levocetirizine), Allegra (fexofenadine), and Claritin (loratidine).  Continue Flonase 2 sprays in each nostril once a day as needed for a stuffy nose Consider saline nasal rinses as needed for nasal symptoms. Use this before any medicated nasal sprays for best result  Allergic conjunctivitis Some over the counter eye drops include Pataday one drop in each eye once a day as needed for red, itchy eyes OR Zaditor one drop in each eye twice a day as needed for red itchy eyes.  Atopic dermatitis Continue a daily moisturizing routine For stubborn red, itchy areas below your face, begin triamcinolone 0.1% ointment twice a day as needed.  Do not use this medication for longer than 2 weeks in a row For stubborn red itchy areas on your face, begin desonide 0.05% ointment twice a day as needed.  Do not use this medication for longer than 2 weeks in a row  Call the clinic if this treatment plan is not working well for you  Follow up in 1 month or sooner if needed.  Reducing Pollen Exposure The American Academy of Allergy, Asthma and Immunology suggests the following steps to reduce your exposure to pollen during allergy seasons. Do not hang sheets or clothing  out to dry; pollen may collect on these items. Do not mow lawns or spend time around freshly cut grass; mowing stirs up pollen. Keep windows closed at night.  Keep car windows closed while driving. Minimize morning activities outdoors, a time when pollen counts are usually at their highest. Stay indoors as much as possible when pollen counts or humidity is high and on windy days when pollen tends to remain in the air longer. Use air conditioning when possible.  Many air conditioners have filters that trap the pollen spores. Use a HEPA room air filter to remove pollen form the indoor air you breathe.  Control of Dog or Cat Allergen Avoidance is the best way to manage a dog or cat allergy. If you have a dog or cat and are allergic to dog or cats, consider removing the dog or cat from the home. If you have a dog or cat but don't want to find it a new home, or if your family wants a pet even though someone in the household is allergic, here are some strategies that may help keep symptoms at bay:  Keep the pet out of your bedroom and restrict it to only a few rooms. Be advised that keeping the dog or cat in only one room will not limit the allergens to that room. Don't pet, hug or kiss the dog or cat; if you do, wash your hands with soap and water. High-efficiency particulate air (HEPA) cleaners run continuously in a bedroom or living room can reduce allergen  levels over time. Regular use of a high-efficiency vacuum cleaner or a central vacuum can reduce allergen levels. Giving your dog or cat a bath at least once a week can reduce airborne allergen.   Control of Dust Mite Allergen Dust mites play a major role in allergic asthma and rhinitis. They occur in environments with high humidity wherever human skin is found. Dust mites absorb humidity from the atmosphere (ie, they do not drink) and feed on organic matter (including shed human and animal skin). Dust mites are a microscopic type of insect that  you cannot see with the naked eye. High levels of dust mites have been detected from mattresses, pillows, carpets, upholstered furniture, bed covers, clothes, soft toys and any woven material. The principal allergen of the dust mite is found in its feces. A gram of dust may contain 1,000 mites and 250,000 fecal particles. Mite antigen is easily measured in the air during house cleaning activities. Dust mites do not bite and do not cause harm to humans, other than by triggering allergies/asthma.  Ways to decrease your exposure to dust mites in your home:  1. Encase mattresses, box springs and pillows with a mite-impermeable barrier or cover  2. Wash sheets, blankets and drapes weekly in hot water (130 F) with detergent and dry them in a dryer on the hot setting.  3. Have the room cleaned frequently with a vacuum cleaner and a damp dust-mop. For carpeting or rugs, vacuuming with a vacuum cleaner equipped with a high-efficiency particulate air (HEPA) filter. The dust mite allergic individual should not be in a room which is being cleaned and should wait 1 hour after cleaning before going into the room.  4. Do not sleep on upholstered furniture (eg, couches).  5. If possible removing carpeting, upholstered furniture and drapery from the home is ideal. Horizontal blinds should be eliminated in the rooms where the person spends the most time (bedroom, study, television room). Washable vinyl, roller-type shades are optimal.  6. Remove all non-washable stuffed toys from the bedroom. Wash stuffed toys weekly like sheets and blankets above.  7. Reduce indoor humidity to less than 50%. Inexpensive humidity monitors can be purchased at most hardware stores. Do not use a humidifier as can make the problem worse and are not recommended.  Control of Mold Allergen Mold and fungi can grow on a variety of surfaces provided certain temperature and moisture conditions exist.  Outdoor molds grow on plants, decaying  vegetation and soil.  The major outdoor mold, Alternaria and Cladosporium, are found in very high numbers during hot and dry conditions.  Generally, a late Summer - Fall peak is seen for common outdoor fungal spores.  Rain will temporarily lower outdoor mold spore count, but counts rise rapidly when the rainy period ends.  The most important indoor molds are Aspergillus and Penicillium.  Dark, humid and poorly ventilated basements are ideal sites for mold growth.  The next most common sites of mold growth are the bathroom and the kitchen.  Outdoor [employer] Use air conditioning and keep windows closed Avoid exposure to decaying vegetation. Avoid leaf raking. Avoid grain handling. Consider wearing a face mask if working in moldy areas.  Indoor Mold Control Maintain humidity below 50%. Clean washable surfaces with 5% bleach solution. Remove sources e.g. Contaminated carpets.  Control of Mold Allergen Mold and fungi can grow on a variety of surfaces provided certain temperature and moisture conditions exist.  Outdoor molds grow on plants, decaying vegetation and soil.  The major outdoor mold, Alternaria and Cladosporium, are found in very high numbers during hot and dry conditions.  Generally, a late Summer - Fall peak is seen for common outdoor fungal spores.  Rain will temporarily lower outdoor mold spore count, but counts rise rapidly when the rainy period ends.  The most important indoor molds are Aspergillus and Penicillium.  Dark, humid and poorly ventilated basements are ideal sites for mold growth.  The next most common sites of mold growth are the bathroom and the kitchen.  Outdoor [employer] Use air conditioning and keep windows closed Avoid exposure to decaying vegetation. Avoid leaf raking. Avoid grain handling. Consider wearing a face mask if working in moldy areas.  Indoor Mold Control Maintain humidity below 50%. Clean washable surfaces with 5% bleach solution. Remove  sources e.g. Contaminated carpets.

## 2023-06-18 ENCOUNTER — Encounter (HOSPITAL_BASED_OUTPATIENT_CLINIC_OR_DEPARTMENT_OTHER): Payer: Self-pay | Admitting: Emergency Medicine

## 2023-06-18 ENCOUNTER — Other Ambulatory Visit: Payer: Self-pay

## 2023-06-18 ENCOUNTER — Telehealth: Payer: Self-pay | Admitting: *Deleted

## 2023-06-18 ENCOUNTER — Emergency Department (HOSPITAL_BASED_OUTPATIENT_CLINIC_OR_DEPARTMENT_OTHER)
Admission: EM | Admit: 2023-06-18 | Discharge: 2023-06-18 | Disposition: A | Discharge status: Home / Self Care | Payer: Medicaid Other | Attending: Emergency Medicine | Admitting: Emergency Medicine

## 2023-06-18 ENCOUNTER — Ambulatory Visit: Payer: Medicaid Other | Admitting: Family Medicine

## 2023-06-18 DIAGNOSIS — Z91040 Latex allergy status: Secondary | ICD-10-CM | POA: Insufficient documentation

## 2023-06-18 DIAGNOSIS — J45901 Unspecified asthma with (acute) exacerbation: Secondary | ICD-10-CM | POA: Diagnosis not present

## 2023-06-18 DIAGNOSIS — R0602 Shortness of breath: Secondary | ICD-10-CM | POA: Diagnosis present

## 2023-06-18 MED ORDER — AIRSUPRA 90-80 MCG/ACT IN AERO: 2.0000 | INHALATION_SPRAY | Freq: Four times a day (QID) | RESPIRATORY_TRACT | 1 refills | Status: DC | PRN

## 2023-06-18 MED ORDER — PREDNISONE 50 MG PO TABS
60.0000 mg | ORAL_TABLET | Freq: Once | ORAL | Status: AC
  Administered 2023-06-18: 60 mg via ORAL
  Filled 2023-06-18: qty 1

## 2023-06-18 MED ORDER — AIRSUPRA 90-80 MCG/ACT IN AERO: 2.0000 | INHALATION_SPRAY | RESPIRATORY_TRACT | 1 refills | Status: DC | PRN

## 2023-06-18 MED ORDER — IPRATROPIUM-ALBUTEROL 0.5-2.5 (3) MG/3ML IN SOLN
3.0000 mL | Freq: Once | RESPIRATORY_TRACT | Status: AC
  Administered 2023-06-18: 3 mL via RESPIRATORY_TRACT
  Filled 2023-06-18: qty 3

## 2023-06-18 MED ORDER — DUPIXENT 300 MG/2ML ~~LOC~~ SOSY
600.0000 mg | PREFILLED_SYRINGE | Freq: Once | SUBCUTANEOUS | 11 refills | Status: DC
Start: 2023-06-18 — End: 2024-07-01
  Filled 2023-06-19: qty 4, 1d supply, fill #0
  Filled 2023-06-19 – 2023-06-26 (×2): qty 4, 28d supply, fill #0
  Filled 2023-07-22: qty 4, 28d supply, fill #1
  Filled 2023-08-19: qty 4, 28d supply, fill #2
  Filled 2023-09-11: qty 4, 28d supply, fill #3
  Filled 2023-10-21: qty 4, 28d supply, fill #4
  Filled 2023-11-25: qty 4, 28d supply, fill #5
  Filled 2023-12-12: qty 4, 28d supply, fill #6
  Filled 2024-01-30 – 2024-02-20 (×2): qty 4, 28d supply, fill #7
  Filled 2024-03-18: qty 4, 28d supply, fill #8
  Filled 2024-04-29: qty 4, 28d supply, fill #9

## 2023-06-18 MED ORDER — IPRATROPIUM-ALBUTEROL 0.5-2.5 (3) MG/3ML IN SOLN
3.0000 mL | Freq: Once | RESPIRATORY_TRACT | Status: DC
Start: 2023-06-18 — End: 2023-06-18

## 2023-06-18 NOTE — ED Provider Notes (Signed)
[place] EMERGENCY DEPARTMENT AT [facility] Provider Note   CSN: [id number] Arrival date & time: 06/18/23  1328     History  Chief Complaint  Patient presents with   Shortness of Breath    Ariel Mooney is a 22 y.o. female.  Patient here with wheezing, shortness of breath.  History of asthma.  Was seen a couple days ago but pharmacy did not have her prescription for prednisone ready yesterday.  Her allergist and asthma doctor changed some of her chronic medications as well yesterday.  She does using we have the pick of her steroids yesterday but was unable to.  She has been using albuterol inhaler at home but still felt some tightness.  She denies any fevers or chills.  She denies any recent surgery or travel.  She is not on any birth control.  No history of blood clots.  No weakness numbness tingling.  The history is provided by the patient.       Home Medications Prior to Admission medications   Medication Sig Start Date End Date Taking? Authorizing Provider  Albuterol-Budesonide (AIRSUPRA) 90-80 MCG/ACT AERO Inhale 2 puffs into the lungs as needed. 06/18/23   Hetty Blend, FNP  benralizumab (FASENRA PEN) 30 MG/ML prefilled autoinjector Inject 1 mL (30 mg total) into the skin every 8 (eight) weeks. 02/01/23   Marcelyn Bruins, MD  Budeson-Glycopyrrol-Formoterol (BREZTRI AEROSPHERE) 160-9-4.8 MCG/ACT AERO Inhale 2 puffs into the lungs in the morning and at bedtime. 06/17/23   Hetty Blend, FNP  cromolyn (OPTICROM) 4 % ophthalmic solution 2 drops each eye up to 4 times a day as needed for itchy/watery eyes. 01/31/23   Marcelyn Bruins, MD  DYMISTA [id number] MCG/ACT SUSP 1 spray each nostril twice a day as needed for nasal drainage or congestion 01/31/23   Marcelyn Bruins, MD  levocetirizine (XYZAL) 5 MG tablet Take 1 tablet (5 mg total) by mouth every evening. 01/31/23   Marcelyn Bruins, MD  montelukast (SINGULAIR) 10 MG tablet Take 1  tablet (10 mg total) by mouth at bedtime. 01/31/23   Marcelyn Bruins, MD  predniSONE (DELTASONE) 10 MG tablet Take 4 tablets (40 mg total) by mouth daily for 3 days. 06/17/23 06/20/23  Coral Spikes, DO  VENTOLIN HFA 108 (90 Base) MCG/ACT inhaler INHALE 2 PUFFS BY MOUTH EVERY 4 HOURS AS NEEDED FOR WHEEZING OR SHORTNESS OF BREATH 06/11/23   Marcelyn Bruins, MD      Allergies    Latex    Review of Systems   Review of Systems  Physical Exam Updated Vital Signs BP 103/76   Pulse 77   Temp 98.6 F (37 C) (Oral)   Resp 17   SpO2 99%  Physical Exam Vitals and nursing note reviewed.  Constitutional:      General: She is not in acute distress.    Appearance: She is well-developed. She is not ill-appearing.  HENT:     Head: Normocephalic and atraumatic.     Mouth/Throat:     Mouth: Mucous membranes are moist.  Eyes:     Extraocular Movements: Extraocular movements intact.     Conjunctiva/sclera: Conjunctivae normal.     Pupils: Pupils are equal, round, and reactive to light.  Cardiovascular:     Rate and Rhythm: Normal rate and regular rhythm.     Pulses: Normal pulses.     Heart sounds: Normal heart sounds. No murmur heard. Pulmonary:     Effort: Pulmonary effort is  normal. No respiratory distress.     Breath sounds: Wheezing present.  Abdominal:     Palpations: Abdomen is soft.     Tenderness: There is no abdominal tenderness.  Musculoskeletal:        General: No swelling.     Cervical back: Normal range of motion and neck supple.  Skin:    General: Skin is warm and dry.     Capillary Refill: Capillary refill takes less than 2 seconds.  Neurological:     General: No focal deficit present.     Mental Status: She is alert.  Psychiatric:        Mood and Affect: Mood normal.     ED Results / Procedures / Treatments   Labs (all labs ordered are listed, but only abnormal results are displayed) Labs Reviewed - No data to display  EKG EKG  Interpretation Date/Time:  Tuesday June 18 2023 13:35:23 EDT Ventricular Rate:  73 PR Interval:  150 QRS Duration:  116 QT Interval:  379 QTC Calculation: 418 R Axis:   27  Text Interpretation: Sinus rhythm Confirmed by Virgina Norfolk [phone]) on 06/18/2023 2:02:32 PM  Radiology DG Chest Port 1 View  Result Date: 06/16/2023 CLINICAL DATA:  10026 Shortness of breath 10026 EXAM: PORTABLE CHEST 1 VIEW COMPARISON:  Dec 24, 2013 FINDINGS: The cardiomediastinal silhouette is unchanged in contour. No pleural effusion. No pneumothorax. No acute pleuroparenchymal abnormality. Mildly prominent bronchitic markings. IMPRESSION: No acute cardiopulmonary abnormality. Mildly prominent bronchitic markings consistent with history of asthma. Electronically Signed   By: Meda Klinefelter M.D.   On: 06/16/2023 20:28    Procedures Procedures    Medications Ordered in ED Medications  ipratropium-albuterol (DUONEB) 0.5-2.5 (3) MG/3ML nebulizer solution 3 mL (3 mLs Nebulization Given 06/18/23 1350)  predniSONE (DELTASONE) tablet 60 mg (60 mg Oral Given 06/18/23 1352)    ED Course/ Medical Decision Making/ A&P                                 Medical Decision Making Risk Prescription drug management.   [name] is here with shortness of breath and wheezing.  History of asthma.  Normal vitals.  No fever.  Normal work of breathing.  Some scattered wheezing on exam.  She had chest x-ray done a couple days ago that was unremarkable.  She is not having any fever.  She is unable to pick up her prednisone prescription yesterday but pharmacy now has her prescriptions ready.  She saw her asthma doctor yesterday who changed some of her chronic medications as well.  But she felt like she did not have a great improvement from her albuterol inhaler so she decided to come here.  Overall she appears well.  She is PERC negative.  Have no concern for PE.  EKG shows sinus rhythm.  No ischemic changes.  Doubt  ACS.  Overall she is given DuoNeb treatment and prednisone dose here with good improvement.  Recommend continued use of prednisone at home and overall I suspect that this is an asthma exacerbation.  I think she will do well once she is more consistent with her prednisone.  She understands return precautions.  Discharged in good condition.  This chart was dictated using voice recognition software.  Despite best efforts to proofread,  errors can occur which can change the documentation meaning.         Final Clinical Impression(s) / ED Diagnoses Final  diagnoses:  Mild asthma with exacerbation, unspecified whether persistent    Rx / DC Orders ED Discharge Orders     None         Virgina Norfolk, DO 06/18/23 1411

## 2023-06-18 NOTE — Telephone Encounter (Signed)
Called patient and advised approval and submit to [facility] for Dupixent with initial loading dose in clinic then home admin every 2 weeks. Will reach out once delivery set to make start appt

## 2023-06-18 NOTE — ED Triage Notes (Signed)
Pt reports continued shob.  Seen 2 days ago here.  Allergy and asthma physician follow up was yesterday.  Has not picked up those meds yet.  One home neb this morning.

## 2023-06-18 NOTE — Progress Notes (Deleted)
[address] [place] [address] Dept: [phone]  FOLLOW UP NOTE  Patient ID: Ariel Mooney, female    DOB: July 18, 2001  Age: 22 y.o. MRN: [id number] Date of Office Visit: 06/18/2023  Assessment  Chief Complaint: No chief complaint on file.  HPI Ariel Mooney   Discussed the use of AI scribe software for clinical note transcription with the patient, who gave verbal consent to proceed.  History of Present Illness             Drug Allergies:  Allergies  Allergen Reactions   Latex Shortness Of Breath and Rash    Physical Exam: There were no vitals taken for this visit.   Physical Exam  Diagnostics:    Assessment and Plan: No diagnosis found.  No orders of the defined types were placed in this encounter.   There are no Patient Instructions on file for this visit.  No follow-ups on file.    Thank you for the opportunity to care for this patient.  Please do not hesitate to contact me with questions.  Thermon Leyland, FNP [facility]

## 2023-06-18 NOTE — Telephone Encounter (Signed)
-----   Message from Thermon Leyland sent at 06/17/2023  7:04 PM EDT ----- Hi Chelisa Hennen, Can you please change this patient over to Dupixent from [facility] for uncontrolled asthma? Thank you

## 2023-06-18 NOTE — Discharge Instructions (Addendum)
Take your next dose of prednisone tomorrow.  Continue to use albuterol inhaler 4 puffs every 4-6 hours as needed.

## 2023-06-19 ENCOUNTER — Other Ambulatory Visit: Payer: Self-pay

## 2023-06-19 ENCOUNTER — Other Ambulatory Visit (HOSPITAL_COMMUNITY): Payer: Self-pay

## 2023-06-19 NOTE — Progress Notes (Addendum)
Loading Dose is not covered-Tammy notified and checking if they have samples to use.   Sending first "regular' dose to office and they will give to patient for home dosing

## 2023-06-19 NOTE — Progress Notes (Signed)
LVM for patient for clinical assessment on Dupixent. Pt will receive LD in clinic (samples) and will receive administration education onsite. Patient will need to call Specialty Pharmacy and speak with [facility] prior to receiving Dupixent.

## 2023-06-20 NOTE — Telephone Encounter (Signed)
L/m for patient to contact clinic to make appt for loading dose in clinic. Have sample set aside for initial dose due to Ins not covering the loading dose

## 2023-06-21 ENCOUNTER — Other Ambulatory Visit: Payer: Self-pay

## 2023-06-25 ENCOUNTER — Other Ambulatory Visit: Payer: Self-pay

## 2023-06-25 ENCOUNTER — Other Ambulatory Visit (HOSPITAL_COMMUNITY): Payer: Self-pay

## 2023-06-25 NOTE — Progress Notes (Signed)
Specialty Pharmacy Initiation Note   Ariel Mooney is a 22 y.o. female who will be followed by the specialty pharmacy service for RxSp Asthma/COPD    Review of administration, indication, effectiveness, safety, potential side effects, storage/disposable, and missed dose instructions occurred today for patient's specialty medication(s) Dupilumab     Patient/Caregiver did not have any additional questions or concerns.   Patient's therapy is appropriate to: Initiate    Goals Addressed             This Visit's Progress    Minimize recurrence of flares       Patient is initiating therapy. Patient will maintain adherence         Bobette Mo Specialty Pharmacist

## 2023-06-25 NOTE — Progress Notes (Signed)
Specialty Pharmacy Initial Fill Coordination Note  Ariel Mooney is a 22 y.o. female contacted today regarding refills of specialty medication(s) Dupilumab   Patient requested Courier to Provider Office   Delivery date: 06/27/23   Verified address: [address] [place] [address]   Medication will be filled on 11/06.   Patient is aware of $4.00 copayment.

## 2023-06-26 ENCOUNTER — Other Ambulatory Visit (HOSPITAL_COMMUNITY): Payer: Self-pay

## 2023-06-26 ENCOUNTER — Other Ambulatory Visit: Payer: Self-pay

## 2023-06-27 ENCOUNTER — Other Ambulatory Visit: Payer: Self-pay

## 2023-07-11 ENCOUNTER — Other Ambulatory Visit: Payer: Self-pay

## 2023-07-11 ENCOUNTER — Ambulatory Visit (INDEPENDENT_AMBULATORY_CARE_PROVIDER_SITE_OTHER): Payer: Medicaid Other

## 2023-07-11 DIAGNOSIS — J4550 Severe persistent asthma, uncomplicated: Secondary | ICD-10-CM | POA: Diagnosis not present

## 2023-07-11 MED ORDER — DUPILUMAB 300 MG/2ML ~~LOC~~ SOSY
300.0000 mg | PREFILLED_SYRINGE | SUBCUTANEOUS | Status: AC
  Administered 2023-07-11: 300 mg via SUBCUTANEOUS

## 2023-07-11 NOTE — Progress Notes (Signed)
Immunotherapy   Patient Details  Name: Ariel Mooney MRN: [id number] Date of Birth: 08/16/2001  07/11/2023  Ariel Mooney started injections for  Dupixent Following schedule: Every fourteen days.  Frequency:Every two weeks. Epi-Pen:Not needed.  Consent signed in office today and patient instructions given on how to inject Dupixent into the abdomen and thigh. Patient successfully injected Dupixent into the left thigh after I showed and demonstrated on the right thigh on how to successfully inject Dupixent into the thigh.    Ariel Mooney 07/11/2023, 3:04 PM

## 2023-07-22 ENCOUNTER — Other Ambulatory Visit (HOSPITAL_COMMUNITY): Payer: Self-pay | Admitting: Pharmacy Technician

## 2023-07-22 ENCOUNTER — Other Ambulatory Visit (HOSPITAL_COMMUNITY): Payer: Self-pay

## 2023-07-22 NOTE — Progress Notes (Signed)
Specialty Pharmacy Refill Coordination Note  Ariel Mooney is a 22 y.o. female contacted today regarding refills of specialty medication(s) Dupilumab   Patient requested Delivery   Delivery date: 07/24/23   Verified address: [address]  [place] (New Address updated)   Medication will be filled on 07/23/23.

## 2023-07-23 ENCOUNTER — Other Ambulatory Visit: Payer: Self-pay

## 2023-08-06 ENCOUNTER — Ambulatory Visit: Payer: Medicaid Other | Admitting: Family

## 2023-08-13 ENCOUNTER — Other Ambulatory Visit: Payer: Self-pay

## 2023-08-15 ENCOUNTER — Other Ambulatory Visit: Payer: Self-pay

## 2023-08-19 ENCOUNTER — Other Ambulatory Visit: Payer: Self-pay

## 2023-08-19 ENCOUNTER — Ambulatory Visit: Payer: Medicaid Other | Admitting: Family Medicine

## 2023-08-19 NOTE — Progress Notes (Signed)
Specialty Pharmacy Refill Coordination Note  Ariel Mooney is a 22 y.o. female contacted today regarding refills of specialty medication(s) Dupilumab (DUPIXENT)   Patient requested Delivery   Delivery date: 08/23/23   Verified address: [address]   [place] [address]   Medication will be filled on 08/22/23.

## 2023-08-19 NOTE — Progress Notes (Deleted)
   [address] [place] [address] Dept: [phone]  FOLLOW UP NOTE  Patient ID: Ariel Mooney, female    DOB: 11/29/2000  Age: 22 y.o. MRN: [id number] Date of Office Visit: 08/19/2023  Assessment  Chief Complaint: No chief complaint on file.  HPI Ariel Mooney is a 22 year old female who presents to the clinic for follow-up visit.  She was last seen in this clinic on 06/17/2023 for evaluation of poorly controlled asthma, allergic rhinitis, allergic conjunctivitis, and atopic dermatitis.  At that visit we switched from [facility] to Dupixent for asthma control.  Chart review indicates that on 06/16/2023 she presented to emergency for evaluation of asthma with exacerbation at which time she received a steroid injection and oral steroid taper.  She then presented to the allergy and asthma clinic on 06/17/2023 at which time some of her medications were changed.  Chart review indicates that she presented to the emergency department again on 06/18/2023 for asthma with acute exacerbation stating that she was unable to receive the prednisone from the pharmacy on 06/16/2023.  She did receive prednisone from the emergency department on 06/18/2023.  Discussed the use of AI scribe software for clinical note transcription with the patient, who gave verbal consent to proceed.  History of Present Illness             Drug Allergies:  Allergies  Allergen Reactions   Latex Shortness Of Breath and Rash    Physical Exam: There were no vitals taken for this visit.   Physical Exam  Diagnostics:    Assessment and Plan: No diagnosis found.  No orders of the defined types were placed in this encounter.   There are no Patient Instructions on file for this visit.  No follow-ups on file.    Thank you for the opportunity to care for this patient.  Please do not hesitate to contact me with questions.  Thermon Leyland, FNP [facility]

## 2023-08-22 ENCOUNTER — Other Ambulatory Visit: Payer: Self-pay

## 2023-08-24 ENCOUNTER — Other Ambulatory Visit: Payer: Self-pay | Admitting: Allergy

## 2023-09-11 ENCOUNTER — Other Ambulatory Visit: Payer: Self-pay

## 2023-09-11 NOTE — Progress Notes (Signed)
Specialty Pharmacy Refill Coordination Note  Ariel Mooney is a 23 y.o. female contacted today regarding refills of specialty medication(s) Dupilumab (DUPIXENT)   Patient requested Delivery   Delivery date: 09/20/23   Verified address: [address]   [place] [address]   Medication will be filled on 09/19/23.

## 2023-09-19 ENCOUNTER — Other Ambulatory Visit: Payer: Self-pay

## 2023-10-14 ENCOUNTER — Other Ambulatory Visit: Payer: Self-pay

## 2023-10-17 ENCOUNTER — Other Ambulatory Visit: Payer: Self-pay

## 2023-10-21 ENCOUNTER — Other Ambulatory Visit: Payer: Self-pay

## 2023-10-21 NOTE — Progress Notes (Signed)
 Specialty Pharmacy Refill Coordination Note  Ariel Mooney is a 23 y.o. female contacted today regarding refills of specialty medication(s) Dupilumab (DUPIXENT)   Patient requested Delivery   Delivery date: 10/22/23   Verified address: [address]   [place] [address]   Medication will be filled on 10/21/23.

## 2023-11-11 ENCOUNTER — Ambulatory Visit (HOSPITAL_COMMUNITY): Admission: EM | Admit: 2023-11-11 | Discharge: 2023-11-11 | Disposition: A | Discharge status: Home / Self Care

## 2023-11-11 ENCOUNTER — Other Ambulatory Visit: Payer: Self-pay

## 2023-11-11 ENCOUNTER — Encounter (HOSPITAL_COMMUNITY): Payer: Self-pay

## 2023-11-11 DIAGNOSIS — G44201 Tension-type headache, unspecified, intractable: Secondary | ICD-10-CM | POA: Diagnosis not present

## 2023-11-11 DIAGNOSIS — S161XXA Strain of muscle, fascia and tendon at neck level, initial encounter: Secondary | ICD-10-CM | POA: Diagnosis not present

## 2023-11-11 MED ORDER — DICLOFENAC SODIUM 50 MG PO TBEC: 50.0000 mg | DELAYED_RELEASE_TABLET | Freq: Two times a day (BID) | ORAL | 0 refills | Status: AC

## 2023-11-11 MED ORDER — BACLOFEN 20 MG PO TABS: 20.0000 mg | ORAL_TABLET | Freq: Three times a day (TID) | ORAL | 0 refills | Status: AC
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
## 2023-11-11 NOTE — Discharge Instructions (Addendum)
 1. Acute strain of neck muscle, initial encounter (Primary) - baclofen (LIORESAL) 20 MG tablet; Take 1 tablet (20 mg total) by mouth 3 (three) times daily.  Dispense: 30 each; Refill: 0  2. Acute intractable tension-type headache - diclofenac (VOLTAREN) 50 MG EC tablet; Take 1 tablet (50 mg total) by mouth 2 (two) times daily.  Dispense: 30 tablet; Refill: 0

## 2023-11-13 ENCOUNTER — Other Ambulatory Visit: Payer: Self-pay

## 2023-11-25 ENCOUNTER — Other Ambulatory Visit: Payer: Self-pay

## 2023-11-25 ENCOUNTER — Other Ambulatory Visit (HOSPITAL_COMMUNITY): Payer: Self-pay

## 2023-11-25 NOTE — Progress Notes (Signed)
 Specialty Pharmacy Refill Coordination Note  Ariel Mooney is a 23 y.o. female contacted today regarding refills of specialty medication(s) Dupilumab (DUPIXENT)   Patient requested Delivery   Delivery date: 11/26/23   Verified address: [address]   [place] [address]   Medication will be filled on 04.07.25.

## 2023-12-05 ENCOUNTER — Telehealth: Payer: Self-pay | Admitting: Family Medicine

## 2023-12-05 NOTE — Telephone Encounter (Signed)
 Left voicemail to give the office a call back to schedule Dupixent reapproval appointment.

## 2023-12-11 ENCOUNTER — Other Ambulatory Visit: Payer: Self-pay

## 2023-12-12 ENCOUNTER — Other Ambulatory Visit (HOSPITAL_COMMUNITY): Payer: Self-pay

## 2023-12-12 NOTE — Progress Notes (Signed)
 Specialty Pharmacy Ongoing Clinical Assessment Note  Ariel Mooney is a 22 y.o. female who is being followed by the specialty pharmacy service for RxSp Asthma/COPD   Patient's specialty medication(s) reviewed today: Dupilumab  (DUPIXENT )   Missed doses in the last 4 weeks: 0   Patient/Caregiver did not have any additional questions or concerns.   Therapeutic benefit summary: Patient is achieving benefit   Adverse events/side effects summary: No adverse events/side effects   Patient's therapy is appropriate to: Continue    Goals Addressed             This Visit's Progress    Minimize recurrence of flares   On track    Patient is on track. Patient will maintain adherence.  Patient reports that she is well-controlled on therapy.          Follow up:  6 months  Malachi Screws Specialty Pharmacist

## 2023-12-12 NOTE — Progress Notes (Signed)
 Specialty Pharmacy Refill Coordination Note  Ariel Mooney is a 23 y.o. female contacted today regarding refills of specialty medication(s) Dupilumab  (DUPIXENT )   Patient requested Delivery   Delivery date: 12/26/23   Verified address: [address]   [place] [address]   Medication will be filled on 12/25/23.

## 2023-12-25 ENCOUNTER — Other Ambulatory Visit: Payer: Self-pay

## 2023-12-25 NOTE — Progress Notes (Signed)
 Mychart message sent- nothing can be done until patient has appointment.

## 2023-12-25 NOTE — Progress Notes (Signed)
 Needs PA. Routed to Juliana.

## 2023-12-26 ENCOUNTER — Other Ambulatory Visit: Payer: Self-pay

## 2023-12-27 ENCOUNTER — Other Ambulatory Visit: Payer: Self-pay

## 2023-12-27 NOTE — Progress Notes (Signed)
 Spoke with patient because they had not read MyChart message. They will call office to make appointment. Moving fill to 5/16 for follow up.

## 2024-01-01 ENCOUNTER — Encounter: Payer: Self-pay | Admitting: Allergy

## 2024-01-01 ENCOUNTER — Other Ambulatory Visit: Payer: Self-pay

## 2024-01-01 ENCOUNTER — Ambulatory Visit: Admitting: Allergy

## 2024-01-01 VITALS — BP 104/68 | HR 92 | Temp 98.0°F | Ht 62.21 in | Wt 253.0 lb

## 2024-01-01 DIAGNOSIS — L2084 Intrinsic (allergic) eczema: Secondary | ICD-10-CM

## 2024-01-01 DIAGNOSIS — H1013 Acute atopic conjunctivitis, bilateral: Secondary | ICD-10-CM

## 2024-01-01 DIAGNOSIS — J3089 Other allergic rhinitis: Secondary | ICD-10-CM

## 2024-01-01 DIAGNOSIS — J4550 Severe persistent asthma, uncomplicated: Secondary | ICD-10-CM

## 2024-01-01 DIAGNOSIS — J302 Other seasonal allergic rhinitis: Secondary | ICD-10-CM

## 2024-01-01 DIAGNOSIS — H1010 Acute atopic conjunctivitis, unspecified eye: Secondary | ICD-10-CM

## 2024-01-01 NOTE — Progress Notes (Unsigned)
 Follow-up Note  RE: Ariel Mooney MRN: [id number] DOB: December 08, 2000 Date of Office Visit: 01/01/2024   History of present illness: Ariel Mooney is a 23 y.o. female presenting today for follow-up of asthma, allergic rhinitis with conjunctivitis, eczema.  She was last seen in the office on 06/17/2023 by nurse practitioner Ambs. Discussed the use of AI scribe software for clinical note transcription with the patient, who gave verbal consent to proceed.  Her asthma has been well-controlled since switching from Fasenra  to Dupixent , which she administers at home every two weeks.  She denies any issues with the administration.  She does rotate her injection sites.  She notes a significant improvement in her asthma symptoms. She continues to use Breztri  inhaler, two puffs twice a day.  Despite the improvement in asthma, she reports worsening nasal congestion and allergy symptoms since the end of March, coinciding with the peak pollen season. 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 She experiences significant nasal congestion, runniness, and drainage, which is not alleviated by Flonase  or levocetirizine, even when taking two doses. She has tried various over-the-counter antihistamines without relief.  Her family history includes severe allergies, as her brother has had nasal polyps and underwent surgery for nasal congestion issues and polyp removal.   Since her last visit she did move out of a house with black mold, which she believes has significantly improved her asthma and overall health, as well as that of her children.   Review of systems: 10pt ROS negative unless noted above in HPI  Past medical/social/surgical/family history have been reviewed and are unchanged unless specifically indicated below.  No changes  Medication List: Current Outpatient Medications  Medication Sig Dispense Refill   Carbinoxamine  Maleate 4 MG TABS Take 2 tablets (8 mg total) by mouth in the morning and at bedtime. 60 tablet 5   desonide  (DESOWEN ) 0.05 % ointment Apply 1 Application topically 2 (two) times daily as needed. 15 g 0   dupilumab  (DUPIXENT ) 300 MG/2ML prefilled syringe Inject 600 mg into the skin once for 1 dose. Then 300mg  every 14 days 4 mL 11   levocetirizine (XYZAL ) 5 MG tablet Take 1 tablet (5 mg total) by mouth every evening. 30 tablet 5   Olopatadine HCl (PATADAY) 0.2 % SOLN Place 1 drop into both eyes 1 day or 1 dose. 2.5 mL 5   Olopatadine-Mometasone  (RYALTRIS) 665-25 MCG/ACT SUSP Place 2 sprays into the nose in the morning and at bedtime. 29 g 3   triamcinolone  ointment (KENALOG ) 0.1 % Apply 1 Application topically 2 (two) times daily. 30 g 0   VENTOLIN  HFA 108 (90 Base) MCG/ACT inhaler INHALE 2 PUFFS BY MOUTH EVERY 4 HOURS AS NEEDED FOR WHEEZING OR SHORTNESS OF BREATH 18 g 1   Albuterol -Budesonide  (AIRSUPRA ) 90-80 MCG/ACT AERO Inhale 2 puffs into the lungs 4 (four) times daily as needed. 10.7 g 1   baclofen  (LIORESAL ) 20 MG tablet Take 1 tablet (20 mg total) by mouth 3 (three) times daily. (Patient not taking: Reported on 01/01/2024) 30 each 0   budesonide -glycopyrrolate -formoterol  (BREZTRI  AEROSPHERE) 160-9-4.8 MCG/ACT AERO inhaler Inhale 2 puffs into the lungs in the morning and at bedtime. 10.7 g 5   diclofenac  (VOLTAREN ) 50 MG EC tablet Take 1 tablet (50 mg total) by mouth 2 (two) times daily. (Patient not taking: Reported on 01/01/2024) 30 tablet 0   montelukast  (SINGULAIR ) 10 MG tablet Take 1 tablet (10 mg total) by  mouth at bedtime. 30 tablet 5   Current Facility-Administered Medications  Medication Dose Route Frequency Provider Last Rate Last Admin   dupilumab  (DUPIXENT ) prefilled syringe 300 mg  300 mg Subcutaneous Q14 Days Roxi Hlavaty Patricia, MD   300 mg at 07/11/23 1430     Known medication allergies: Allergies  Allergen Reactions   Latex Shortness Of Breath and Rash      Physical examination: Blood pressure 104/68, pulse 92, temperature 98 F (36.7 C), height 5' 2.21" (1.58 m), weight 253 lb (114.8 kg), SpO2 97%, not currently breastfeeding.  General: Alert, interactive, in no acute distress. HEENT: PERRLA, TMs pearly gray, turbinates markedly edematous and pale without discharge, post-pharynx non erythematous. Neck: Supple without lymphadenopathy. Lungs: Clear to auscultation without wheezing, rhonchi or rales. {no increased work of breathing. CV: Normal S1, S2 without murmurs. Abdomen: Nondistended, nontender. Skin: Warm and dry, without lesions or rashes. Extremities:  No clubbing, cyanosis or edema. Neuro:   Grossly intact.  Diagnositics/Labs:  Spirometry: FEV1: 3.32 L 116%, FVC: 3.57 L 109%, ratio consistent with nonobstructive pattern  Assessment and plan:   Asthma-under good control at this time Continue Breztri   2 puffs twice a day with a spacer.  Rinse mouth after use.  Continue Montelukast  10 mg at bedtime. Use AirSupra  2 puffs every 4-6 hours as needed for cough/wheeze/shortness of breath/chest tightness.  May use 15-20 minutes prior to activity.   Monitor frequency of use.  Do not use more than 12 puffs in one 24 hour period.  Continue Dupixent  injections self-administered every 2 weeks.  Rotate your injection sites (abdomen, thighs are options)  Asthma control goals:  Full participation in all desired activities (may need albuterol  before activity) Albuterol  use two time or less a week on average (not counting use with activity) Cough interfering with sleep two time or less a month Oral steroids no more than once a year No hospitalizations  Allergic rhinitis with conjunctivitis - severely congested currently Continue allergen avoidance measures directed toward pollens, pets, dust mite, mold, and cockroach. Start Carbinoxamine 4mg  tab take 2 tabs (8mg ) twice a day. Use nasal Afrin 2 sprays each nostril 1-2 times a day for no  more than 3-5 days in a row.  Wait 5-15 minutes until you can breathe more freely through the nose then use Ryaltris spray.  Start Ryaltris 2 sprays each nostril twice a day for nasal congestion or drainage control.   With using nasal sprays point tip of bottle toward eye on same side nostril and lean head slightly forward for best technique.   Use Olopatadine 1 drop each eye daily as needed for itchy/watery eyes.  Consider saline nasal rinses as needed for nasal symptoms. Use this before any medicated nasal sprays for best result Allergy shots "re-train" and "reset" the immune system to ignore environmental allergens and decrease the resulting immune response to those allergens (sneezing, itchy watery eyes, runny nose, nasal congestion, etc).  Allergy shots improve symptoms in 75-85% of patients.  If interested in this option let us  know.    Atopic dermatitis Continue a daily moisturizing routine For stubborn red, itchy areas below your face, begin triamcinolone  0.1% ointment twice a day as needed.  Do not use this medication for longer than 2 weeks in a row For stubborn red itchy areas on your face, begin desonide  0.05% ointment twice a day as needed.  Do not use this medication for longer than 2 weeks in a row   Follow up in 4-6 month  or sooner if needed    I appreciate the opportunity to take part in Jaiana's care. Please do not hesitate to contact me with questions.  Sincerely,   Catha Clink, MD Allergy/Immunology [facility]

## 2024-01-01 NOTE — Patient Instructions (Addendum)
 Asthma Continue Breztri   2 puffs twice a day with a spacer.  Rinse mouth after use.  Continue Montelukast  10 mg at bedtime. Use AirSupra  2 puffs every 4-6 hours as needed for cough/wheeze/shortness of breath/chest tightness.  May use 15-20 minutes prior to activity.   Monitor frequency of use.  Do not use more than 12 puffs in one 24 hour period.  Continue Dupixent  injections self-administered every 2 weeks.  Rotate your injection sites (abdomen, thighs are options)  Asthma control goals:  Full participation in all desired activities (may need albuterol  before activity) Albuterol  use two time or less a week on average (not counting use with activity) Cough interfering with sleep two time or less a month Oral steroids no more than once a year No hospitalizations  Allergic rhinitis with conjunctivitis - severely congested currently Continue allergen avoidance measures directed toward pollens, pets, dust mite, mold, and cockroach. Start Carbinoxamine 4mg  tab take 2 tabs (8mg ) twice a day. Use nasal Afrin 2 sprays each nostril 1-2 times a day for no more than 3-5 days in a row.  Wait 5-15 minutes until you can breathe more freely through the nose then use Ryaltris spray.  Start Ryaltris 2 sprays each nostril twice a day for nasal congestion or drainage control.   With using nasal sprays point tip of bottle toward eye on same side nostril and lean head slightly forward for best technique.   Use Olopatadine 1 drop each eye daily as needed for itchy/watery eyes.  Consider saline nasal rinses as needed for nasal symptoms. Use this before any medicated nasal sprays for best result Allergy shots "re-train" and "reset" the immune system to ignore environmental allergens and decrease the resulting immune response to those allergens (sneezing, itchy watery eyes, runny nose, nasal congestion, etc).  Allergy shots improve symptoms in 75-85% of patients.  If interested in this option let us  know.    Atopic  dermatitis Continue a daily moisturizing routine For stubborn red, itchy areas below your face, begin triamcinolone  0.1% ointment twice a day as needed.  Do not use this medication for longer than 2 weeks in a row For stubborn red itchy areas on your face, begin desonide  0.05% ointment twice a day as needed.  Do not use this medication for longer than 2 weeks in a row   Follow up in 4-6 month or sooner if needed

## 2024-01-02 MED ORDER — BREZTRI AEROSPHERE 160-9-4.8 MCG/ACT IN AERO: 2.0000 | INHALATION_SPRAY | Freq: Two times a day (BID) | RESPIRATORY_TRACT | 5 refills | Status: DC

## 2024-01-02 MED ORDER — MONTELUKAST SODIUM 10 MG PO TABS: 10.0000 mg | ORAL_TABLET | Freq: Every day | ORAL | 5 refills | Status: DC

## 2024-01-02 MED ORDER — OLOPATADINE HCL 0.2 % OP SOLN: 1.0000 [drp] | OPHTHALMIC | 5 refills | Status: AC

## 2024-01-02 MED ORDER — AIRSUPRA 90-80 MCG/ACT IN AERO: 2.0000 | INHALATION_SPRAY | Freq: Four times a day (QID) | RESPIRATORY_TRACT | 1 refills | Status: AC | PRN

## 2024-01-02 MED ORDER — RYALTRIS 665-25 MCG/ACT NA SUSP: 2.0000 | Freq: Two times a day (BID) | NASAL | 3 refills | Status: DC

## 2024-01-02 MED ORDER — TRIAMCINOLONE ACETONIDE 0.1 % EX OINT: 1.0000 | TOPICAL_OINTMENT | Freq: Two times a day (BID) | CUTANEOUS | 0 refills | Status: DC

## 2024-01-02 MED ORDER — DESONIDE 0.05 % EX OINT: 1.0000 | TOPICAL_OINTMENT | Freq: Two times a day (BID) | CUTANEOUS | 0 refills | Status: DC | PRN

## 2024-01-02 MED ORDER — CARBINOXAMINE MALEATE 4 MG PO TABS: 8.0000 mg | ORAL_TABLET | Freq: Two times a day (BID) | ORAL | 5 refills | Status: DC

## 2024-01-03 ENCOUNTER — Telehealth: Payer: Self-pay

## 2024-01-03 ENCOUNTER — Other Ambulatory Visit (HOSPITAL_COMMUNITY): Payer: Self-pay

## 2024-01-03 NOTE — Progress Notes (Signed)
(  5/16 CA): Patient had an appointment on 5/14. PA still pending.

## 2024-01-03 NOTE — Telephone Encounter (Signed)
 Pharmacy Patient Advocate Encounter  Received notification from [facility] that Prior Authorization for Carbinoxamine Maleate 4MG  tablets  has been APPROVED from 01/03/2024 to 01/02/2025   PA #/Case ID/Reference #: [id number]

## 2024-01-06 ENCOUNTER — Telehealth: Payer: Self-pay

## 2024-01-06 ENCOUNTER — Other Ambulatory Visit: Payer: Self-pay

## 2024-01-06 ENCOUNTER — Other Ambulatory Visit (HOSPITAL_COMMUNITY): Payer: Self-pay

## 2024-01-06 NOTE — Telephone Encounter (Signed)
*  Asthma/Allergy  Pharmacy Patient Advocate Encounter   Received notification from CoverMyMeds that prior authorization for Breztri  Aerosphere 160-9-4.8MCG/ACT aerosol  is required/requested.   Insurance verification completed.   The patient is insured through [facility] .   Per test claim: Refill too soon. PA is not needed at this time. Medication was filled 05/15. Next eligible fill date is 06/07.

## 2024-01-07 ENCOUNTER — Other Ambulatory Visit: Payer: Self-pay

## 2024-01-08 ENCOUNTER — Other Ambulatory Visit: Payer: Self-pay

## 2024-01-08 ENCOUNTER — Other Ambulatory Visit (HOSPITAL_COMMUNITY): Payer: Self-pay

## 2024-01-08 NOTE — Progress Notes (Signed)
 PA now approved. Verified delivery for Friday 05/23-patient okay with this date.

## 2024-01-08 NOTE — Progress Notes (Signed)
 Sending Tammy another message to see about progress in [place]

## 2024-01-09 ENCOUNTER — Other Ambulatory Visit (HOSPITAL_COMMUNITY): Payer: Self-pay

## 2024-01-16 ENCOUNTER — Other Ambulatory Visit: Payer: Self-pay

## 2024-01-29 ENCOUNTER — Other Ambulatory Visit: Payer: Self-pay

## 2024-01-30 ENCOUNTER — Other Ambulatory Visit: Payer: Self-pay

## 2024-02-03 ENCOUNTER — Other Ambulatory Visit: Payer: Self-pay

## 2024-02-06 ENCOUNTER — Other Ambulatory Visit: Payer: Self-pay | Admitting: Allergy

## 2024-02-17 ENCOUNTER — Ambulatory Visit
Admission: EM | Admit: 2024-02-17 | Discharge: 2024-02-17 | Disposition: A | Discharge status: Home / Self Care | Attending: Emergency Medicine | Admitting: Emergency Medicine

## 2024-02-17 ENCOUNTER — Encounter: Payer: Self-pay | Admitting: Emergency Medicine

## 2024-02-17 DIAGNOSIS — M79675 Pain in left toe(s): Secondary | ICD-10-CM | POA: Diagnosis not present

## 2024-02-17 DIAGNOSIS — L600 Ingrowing nail: Secondary | ICD-10-CM

## 2024-02-17 MED ORDER — CEPHALEXIN 500 MG PO CAPS: 500.0000 mg | ORAL_CAPSULE | Freq: Four times a day (QID) | ORAL | 0 refills | Status: AC

## 2024-02-17 NOTE — ED Provider Notes (Signed)
 [facility]    CSN: [id number] Arrival date & time: 02/17/24  1638      History   Chief Complaint Chief Complaint  Patient presents with   Nail Problem    HPI Ariel Mooney is a 23 y.o. female.   23 year old female, Ariel Mooney, presents to urgent care for evaluation of ingrown toenail to the left big toe for 2 weeks.  Patient was seen at urgent care for same, referred to podiatry.Pt states next [facility]ait[facility] appt with [facility] isn't until end of next month, pt states she finished her antibiotic 2 weeks ago. Pt has tried to pick at area at nail cuticle without relief,area is tender to touch,swollen, reports drainage noted.    The history is provided by the patient. No language interpreter was used.    Past Medical History:  Diagnosis Date   Abdominal pain, recurrent    Anxiety    Asthma    since high school not good control   Blood transfusion without reported diagnosis    Eczema    Gall stones    Intrinsic atopic dermatitis 02/09/2021    Patient Active Problem List   Diagnosis Date Noted   Great toe pain, left 02/17/2024   Ingrown toenail of left foot with infection 02/17/2024   Uterine contractions 03/01/2022   Not well controlled severe persistent asthma 02/09/2021   Intrinsic atopic dermatitis 02/09/2021   Severe persistent asthma with acute exacerbation 02/09/2019   Seasonal allergic conjunctivitis 02/09/2019   Seasonal and perennial allergic rhinitis 05/22/2016   Acute sinusitis 05/22/2016   Moderate persistent asthma with exacerbation 01/07/2016   Moderate persistent asthma without complication 01/07/2016   Dyspnea    Asthma exacerbation 07/06/2014   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 05/07/2011    Past Surgical History:  Procedure Laterality Date   CHOLECYSTECTOMY N/A 03/28/2022   Procedure: LAPAROSCOPIC CHOLECYSTECTOMY;  Surgeon: Rubin Calamity, MD;  Location: [facility] OR;  Service: General;  Laterality: N/A;   LAPAROSCOPIC TUBAL  LIGATION N/A 03/04/2023   Procedure: LAPAROSCOPIC TUBAL LIGATION W/FILSHIE CLIPS;  Surgeon: Sarrah Browning, MD;  Location: [facility] [place];  Service: Gynecology;  Laterality: N/A;  WITH FILSHIE CLIPS   TONSILLECTOMY  08/20/2008   VAGINAL DELIVERY N/A 03/01/2022   Procedure: VAGINAL DELIVERY;  Surgeon: Sarrah Browning, MD;  Location: [facility];  Service: Obstetrics;  Laterality: N/A;   WISDOM TOOTH EXTRACTION      OB History     Gravida  2   Para  2   Term  1   Preterm  1   AB      Living  3      SAB      IAB      Ectopic      Multiple  1   Live Births  3            Home Medications    Prior to Admission medications   Medication Sig Start Date End Date Taking? Authorizing Provider  cephALEXin  (KEFLEX ) 500 MG capsule Take 1 capsule (500 mg total) by mouth 4 (four) times daily. 02/17/24  Yes Audryanna Zurita, NP  Albuterol -Budesonide  (AIRSUPRA ) 90-80 MCG/ACT AERO Inhale 2 puffs into the lungs 4 (four) times daily as needed. 01/02/24   Jeneal Danita Macintosh, MD  baclofen  (LIORESAL ) 20 MG tablet Take 1 tablet (20 mg total) by mouth 3 (three) times daily. Patient not taking: Reported on 01/01/2024 11/11/23   Reddick, Johnathan B, NP  budesonide -glycopyrrolate -formoterol  (BREZTRI  AEROSPHERE) 160-9-4.8 MCG/ACT  AERO inhaler Inhale 2 puffs into the lungs in the morning and at bedtime. 01/02/24   Jeneal Danita Macintosh, MD  Carbinoxamine  Maleate 4 MG TABS Take 2 tablets (8 mg total) by mouth in the morning and at bedtime. 01/02/24   Jeneal Danita Macintosh, MD  desonide  (DESOWEN ) 0.05 % ointment Apply 1 Application topically 2 (two) times daily as needed. 01/02/24   Jeneal Danita Macintosh, MD  diclofenac  (VOLTAREN ) 50 MG EC tablet Take 1 tablet (50 mg total) by mouth 2 (two) times daily. Patient not taking: Reported on 01/01/2024 11/11/23   Reddick, Johnathan B, NP  levocetirizine (XYZAL ) 5 MG tablet Take 1 tablet (5 mg total) by mouth every evening.  01/31/23   Jeneal Danita Macintosh, MD  montelukast  (SINGULAIR ) 10 MG tablet Take 1 tablet (10 mg total) by mouth at bedtime. 01/02/24   Jeneal Danita Macintosh, MD  Olopatadine  HCl (PATADAY ) 0.2 % SOLN Place 1 drop into both eyes 1 day or 1 dose. 01/02/24   Jeneal Danita Macintosh, MD  Olopatadine -Mometasone  (RYALTRIS ) [phone] MCG/ACT SUSP Place 2 sprays into the nose in the morning and at bedtime. 01/02/24   Jeneal Danita Macintosh, MD  triamcinolone  ointment (KENALOG ) 0.1 % Apply 1 Application topically 2 (two) times daily. 01/02/24   Jeneal Danita Macintosh, MD  VENTOLIN  HFA 108 (90 Base) MCG/ACT inhaler INHALE 2 PUFFS BY MOUTH EVERY 4 HOURS AS NEEDED FOR WHEEZING OR SHORTNESS OF BREATH 02/07/24   Jeneal Danita Macintosh, MD    Family History Family History  Problem Relation Age of Onset   Asthma Sister    Asthma Brother    Cholelithiasis Sister    Asthma Sister    Asthma Sister    Asthma Brother    Asthma Brother    Asthma Mother    Diabetes Mother    Hypothyroidism Mother    Ulcers Maternal Grandmother    Cholelithiasis Maternal Grandmother    Asthma Maternal Grandmother    Allergic rhinitis Neg Hx    Angioedema Neg Hx    Eczema Neg Hx    Immunodeficiency Neg Hx    Urticaria Neg Hx     Social History Social History   Tobacco Use   Smoking status: Never   Smokeless tobacco: Never  Vaping Use   Vaping status: Never Used  Substance Use Topics   Alcohol use: No   Drug use: No     Allergies   Latex   Review of Systems Review of Systems  Constitutional:  Negative for fever.  Musculoskeletal:  Negative for gait problem.  Skin:  Positive for color change and wound.  All other systems reviewed and are negative.    Physical Exam Triage Vital Signs ED Triage Vitals  Encounter Vitals Group     BP 02/17/24 1646 113/72     Girls Systolic BP Percentile --      Girls Diastolic BP Percentile --      Boys Systolic BP Percentile --      Boys Diastolic BP  Percentile --      Pulse Rate 02/17/24 1646 80     Resp 02/17/24 1646 18     Temp 02/17/24 1646 98.8 F (37.1 C)     Temp Source 02/17/24 1646 Oral     SpO2 02/17/24 1646 98 %     Weight --      Height --      Head Circumference --      Peak Flow --      Pain Score  02/17/24 1647 8     Pain Loc --      Pain Education --      Exclude from Growth Chart --    No data found.  Updated Vital Signs BP 113/72 (BP Location: Right Arm)   Pulse 80   Temp 98.8 F (37.1 C) (Oral)   Resp 18   LMP 01/24/2024 (Approximate)   SpO2 98%   Breastfeeding No   Visual Acuity Right Eye Distance:   Left Eye Distance:   Bilateral Distance:    Right Eye Near:   Left Eye Near:    Bilateral Near:     Physical Exam Vitals and nursing note reviewed.  Constitutional:      Appearance: She is well-developed and well-groomed.   Cardiovascular:     Rate and Rhythm: Normal rate.  Pulmonary:     Effort: Pulmonary effort is normal.  Feet:     Left foot:     Skin integrity: Erythema present.     Toenail Condition: Left toenails are ingrown.     Comments: Left great toe  Skin:    General: Skin is warm.     Capillary Refill: Capillary refill takes less than 2 seconds.     Findings: Erythema and wound present.     Comments: Left great toe   Neurological:     General: No focal deficit present.     Mental Status: She is alert and oriented to person, place, and time.   Psychiatric:        Attention and Perception: Attention normal.        Mood and Affect: Mood normal.        Speech: Speech normal.        Behavior: Behavior is cooperative.      UC Treatments / Results  Labs (all labs ordered are listed, but only abnormal results are displayed) Labs Reviewed - No data to display  EKG   Radiology No results found.  Procedures Procedures (including critical care time)  Medications Ordered in UC Medications - No data to display  Initial Impression / Assessment and Plan / UC Course   I have reviewed the triage vital signs and the nursing notes.  Pertinent labs & imaging results that were available during my care of the patient were reviewed by me and considered in my medical decision making (see chart for details).    Discussed exam findings and plan of care with patient, will refer to local podiatrist for excision ,keflex  scripted,pt states she has mupirocin , strict go to ER precautions given.   Patient verbalized understanding to this provider.  Ddx: Recurrent ingrown toenail, toe pain Final Clinical Impressions(s) / UC Diagnoses   Final diagnoses:  Great toe pain, left  Ingrown toenail of left foot with infection     Discharge Instructions      Take Keflex  as prescribed Use your mupirocin  as previously prescribed Warm salt water soaks with Epsom salt for 20 minutes 3 times a day Elevate left foot when possible Follow-up with podiatrist to have ingrown toenail excised/permanently removed, do not stick anything in skin or toenail, you are trimming nails too short(-into quick) Go to ER for new or worsening issues or concerns     ED Prescriptions     Medication Sig Dispense Auth. Provider   cephALEXin  (KEFLEX ) 500 MG capsule Take 1 capsule (500 mg total) by mouth 4 (four) times daily. 20 capsule Maygen Sirico, Rilla, NP      PDMP not reviewed this  encounter.   Aminta Loose, NP 02/17/24 [phone]

## 2024-02-17 NOTE — ED Triage Notes (Addendum)
 Pt presents with ingrown toe nail on left big toe for about 2 weeks.   She was seen at [facility] last week and was told to soak toe. She has been soaking toe but it has become more painful.

## 2024-02-17 NOTE — Discharge Instructions (Addendum)
 Take Keflex  as prescribed Use your mupirocin  as previously prescribed Warm salt water soaks with Epsom salt for 20 minutes 3 times a day Elevate left foot when possible Follow-up with podiatrist to have ingrown toenail excised/permanently removed, do not stick anything in skin or toenail, you are trimming nails too short(-into quick) Go to ER for new or worsening issues or concerns

## 2024-02-20 ENCOUNTER — Other Ambulatory Visit: Payer: Self-pay

## 2024-02-20 NOTE — Progress Notes (Signed)
 Specialty Pharmacy Refill Coordination Note  Ariel Mooney is a 23 y.o. female contacted today regarding refills of specialty medication(s) Dupilumab  (Dupixent )   Patient requested Delivery   Delivery date: 02/27/24   Verified address: [address]   [place] [id number]   Medication will be filled on 02/26/24.

## 2024-02-26 ENCOUNTER — Other Ambulatory Visit: Payer: Self-pay

## 2024-03-12 ENCOUNTER — Other Ambulatory Visit: Payer: Self-pay

## 2024-03-18 ENCOUNTER — Other Ambulatory Visit: Payer: Self-pay

## 2024-03-18 NOTE — Progress Notes (Signed)
 Specialty Pharmacy Refill Coordination Note  Ariel Mooney is a 23 y.o. female contacted today regarding refills of specialty medication(s) Dupilumab  (Dupixent )   Patient requested Delivery   Delivery date: 04/07/24   Verified address: [address]   [place] [id number]   Medication will be filled on 04/06/24.

## 2024-03-19 ENCOUNTER — Other Ambulatory Visit: Payer: Self-pay

## 2024-04-01 ENCOUNTER — Other Ambulatory Visit: Payer: Self-pay

## 2024-04-06 ENCOUNTER — Other Ambulatory Visit: Payer: Self-pay

## 2024-04-27 ENCOUNTER — Other Ambulatory Visit: Payer: Self-pay

## 2024-04-29 ENCOUNTER — Other Ambulatory Visit: Payer: Self-pay

## 2024-05-01 ENCOUNTER — Other Ambulatory Visit: Payer: Self-pay

## 2024-05-08 ENCOUNTER — Ambulatory Visit: Admitting: Allergy

## 2024-05-29 ENCOUNTER — Ambulatory Visit (INDEPENDENT_AMBULATORY_CARE_PROVIDER_SITE_OTHER): Admitting: Internal Medicine

## 2024-05-29 ENCOUNTER — Other Ambulatory Visit: Payer: Self-pay

## 2024-05-29 ENCOUNTER — Encounter: Payer: Self-pay | Admitting: Internal Medicine

## 2024-05-29 VITALS — BP 110/68 | HR 74 | Temp 98.0°F | Wt 256.2 lb

## 2024-05-29 DIAGNOSIS — L2084 Intrinsic (allergic) eczema: Secondary | ICD-10-CM | POA: Diagnosis not present

## 2024-05-29 DIAGNOSIS — J3089 Other allergic rhinitis: Secondary | ICD-10-CM

## 2024-05-29 DIAGNOSIS — J4550 Severe persistent asthma, uncomplicated: Secondary | ICD-10-CM

## 2024-05-29 DIAGNOSIS — J302 Other seasonal allergic rhinitis: Secondary | ICD-10-CM

## 2024-05-29 MED ORDER — VENTOLIN HFA 108 (90 BASE) MCG/ACT IN AERS: INHALATION_SPRAY | RESPIRATORY_TRACT | 1 refills | Status: AC

## 2024-05-29 MED ORDER — BREZTRI AEROSPHERE 160-9-4.8 MCG/ACT IN AERO: 2.0000 | INHALATION_SPRAY | Freq: Two times a day (BID) | RESPIRATORY_TRACT | 5 refills | Status: AC

## 2024-05-29 MED ORDER — LEVOCETIRIZINE DIHYDROCHLORIDE 5 MG PO TABS: 5.0000 mg | ORAL_TABLET | Freq: Every evening | ORAL | 5 refills | Status: AC

## 2024-05-29 MED ORDER — MONTELUKAST SODIUM 10 MG PO TABS: 10.0000 mg | ORAL_TABLET | Freq: Every day | ORAL | 5 refills | Status: AC

## 2024-05-29 MED ORDER — AZELASTINE-FLUTICASONE 137-50 MCG/ACT NA SUSP: 2.0000 | Freq: Two times a day (BID) | NASAL | 5 refills | Status: AC | PRN

## 2024-05-29 MED ORDER — DESONIDE 0.05 % EX OINT: TOPICAL_OINTMENT | CUTANEOUS | 3 refills | Status: AC

## 2024-05-29 MED ORDER — TRIAMCINOLONE ACETONIDE 0.1 % EX OINT: TOPICAL_OINTMENT | CUTANEOUS | 5 refills | Status: AC

## 2024-05-29 MED ORDER — CLOTRIMAZOLE 1 % EX CREA: 1.0000 | TOPICAL_CREAM | Freq: Two times a day (BID) | CUTANEOUS | 0 refills | Status: AC

## 2024-05-29 NOTE — Progress Notes (Signed)
 Follow up PATIENT  Date of Service/Encounter:  05/29/24  Consult requested by: Arby Lyle LABOR, NP   Subjective:   Ariel Mooney (DOB: [birth year]) is a 23 y.o. female who presents to the clinic on 05/29/2024 for follow up.  History obtained from: chart review and patient.   Last seen on 01/01/2024 with Dr Jeneal and at the time, asthma was well controlled on Breztri , Singulair , Dupixent  but was having a lot of trouble with rhinitis with severe congestion, discussed use of Karbinal , temporary Afrin, Ryaltris .    Reports asthma has done very well on Dupixent . She is now able to work out and is hoping to lose weight.  Rarely needs her Albuterol .  Denies any ER/urgent care visits/oral prednisone  since last visit.  Also taking Breztri  and Singulair  daily.   Allergies are fine also. Not much trouble with congestion, runny nose, drainage.  Not using any nose sprays, did not try the Ryaltris  or Karbinal , does not think they were covered. Taking Xyzal  and Singulair  and they are helping but sometimes still has trouble with her allergies depending on season.  Eczema is doing fine but does have an itchy red rash on abdomen. Does use a workout belt and notes she sweats too.   Past Medical History: Past Medical History:  Diagnosis Date   Abdominal pain, recurrent    Anxiety    Asthma    since high school not good control   Blood transfusion without reported diagnosis    Eczema    Gall stones    Intrinsic atopic dermatitis 02/09/2021    REVIEW OF SYSTEMS: Pertinent positives and negatives discussed in HPI.   Objective:   Physical Exam: BP 110/68 (BP Location: Left Arm, Patient Position: Sitting, Cuff Size: Large)   Pulse 74   Temp 98 F (36.7 C) (Temporal)   Wt 256 lb 3.2 oz (116.2 kg)   SpO2 96%   BMI 46.55 kg/m  Body mass index is 46.55 kg/m. GEN: alert, well developed HEENT: clear conjunctiva, nose with + mild inferior turbinate hypertrophy, pink nasal mucosa, slight  clear rhinorrhea, no cobblestoning HEART: regular rate and rhythm, no murmur LUNGS: clear to auscultation bilaterally, no coughing, unlabored respiration ABDOMEN: soft, non distended  SKIN: erythematous papules on lower abdomen   Spirometry:  Tracings reviewed. Her effort: Good reproducible efforts. FVC: 3.43L, 105% predicted FEV1: 3.21L, 112% predicted FEV1/FVC ratio: 94% Interpretation: Spirometry consistent with normal pattern.  Please see scanned spirometry results for details. Assessment:   1. Intrinsic atopic dermatitis   2. Seasonal and perennial allergic rhinitis   3. Severe persistent asthma without complication (HCC)     Plan/Recommendations:    Severe Persistent Asthma - Controlled MDI technique discussed.  Spirometry today was normal.  Has done very well on Dupixent  and now even able to exercise without symptoms and no flare ups and rare use of Albuterol .  - Maintenance inhaler: continue Dupixent  300mg  subcutaneous every 2 weeks, continue Breztri  160-9-4.57mcg 2 puffs once daily with spacer and Singulair  10mg  daily. - If symptoms worsen, increase Breztri  to 2 puffs twice daily.  - Rescue inhaler: Albuterol  2 puffs via spacer or 1 vial via nebulizer every 4-6 hours as needed for respiratory symptoms of cough, shortness of breath, or wheezing Asthma control goals:  Full participation in all desired activities (may need albuterol  before activity) Albuterol  use two times or less a week on average (not counting use with activity) Cough interfering with sleep two times or less a month Oral steroids no  more than once a year No hospitalizations  Allergic rhinitis with conjunctivitis  - Controlled  - Continue allergen avoidance measures directed toward pollens, pets, dust mite, mold, and cockroach. - Use nasal saline rinses before nose sprays such as with Neilmed Sinus Rinse.  Use distilled water.   - Use Dymista  1-2 sprays each nostril twice daily as needed for runny nose,  drainage, sneezing, congestion. Aim upward and outward. - Use Xyzal  5 mg daily.  - Use Singulair  10mg  daily. Stop if there are any mood/behavioral changes. - Consider allergy shots as long term control of your symptoms by teaching your immune system to be more tolerant of your allergy triggers  Atopic dermatitis Fungal dermatitis  - Uncontrolled rash, possibly fungal.  - Do a daily soaking tub bath in warm water for 10-15 minutes.  - Use a gentle, unscented cleanser at the end of the bath (such as Dove unscented bar or baby wash, or Aveeno sensitive body wash). Then rinse, pat half-way dry, and apply a gentle, unscented moisturizer cream or ointment (Cerave, Cetaphil, Eucerin, Aveeno, Aquaphor, Vanicream, Vaseline)  all over while still damp. Dry skin makes the itching and rash of eczema worse. The skin should be moisturized with a gentle, unscented moisturizer at least twice daily.  - Use only unscented liquid laundry detergent. - Apply prescribed topical steroid (triamcinolone  0.1% below neck or desonide  0.05% above neck) to flared areas (red and thickened eczema) after the moisturizer has soaked into the skin (wait at least 30 minutes). Taper off the topical steroids as the skin improves. Do not use topical steroid for more than 7-10 days at a time.  - Use Clotrimazole 1% twice daily for 1-2 weeks.     Return in about 6 months (around 11/27/2024).  Arleta Blanch, MD [facility] 

## 2024-05-29 NOTE — Patient Instructions (Addendum)
 Severe Persistent Asthma - Maintenance inhaler: continue Dupixent  300mg  subcutaneous every 2 weeks, continue Breztri  2 puffs once daily with spacer and Singulair  10mg  daily. - If symptoms worsen, increase Breztri  to 2 puffs twice daily.  - Rescue inhaler: Albuterol  2 puffs via spacer or 1 vial via nebulizer every 4-6 hours as needed for respiratory symptoms of cough, shortness of breath, or wheezing Asthma control goals:  Full participation in all desired activities (may need albuterol  before activity) Albuterol  use two times or less a week on average (not counting use with activity) Cough interfering with sleep two times or less a month Oral steroids no more than once a year No hospitalizations  Allergic rhinitis with conjunctivitis  - Continue allergen avoidance measures directed toward pollens, pets, dust mite, mold, and cockroach. - Use nasal saline rinses before nose sprays such as with Neilmed Sinus Rinse.  Use distilled water.   - Use Dymista  1-2 sprays each nostril twice daily as needed for runny nose, drainage, sneezing, congestion. Aim upward and outward. - Use Xyzal  5 mg daily.  - Use Singulair  10mg  daily. Stop if there are any mood/behavioral changes. - Consider allergy shots as long term control of your symptoms by teaching your immune system to be more tolerant of your allergy triggers  Atopic dermatitis Fungal dermatitis  - Do a daily soaking tub bath in warm water for 10-15 minutes.  - Use a gentle, unscented cleanser at the end of the bath (such as Dove unscented bar or baby wash, or Aveeno sensitive body wash). Then rinse, pat half-way dry, and apply a gentle, unscented moisturizer cream or ointment (Cerave, Cetaphil, Eucerin, Aveeno, Aquaphor, Vanicream, Vaseline)  all over while still damp. Dry skin makes the itching and rash of eczema worse. The skin should be moisturized with a gentle, unscented moisturizer at least twice daily.  - Use only unscented liquid laundry  detergent. - Apply prescribed topical steroid (triamcinolone  0.1% below neck or desonide  0.05% above neck) to flared areas (red and thickened eczema) after the moisturizer has soaked into the skin (wait at least 30 minutes). Taper off the topical steroids as the skin improves. Do not use topical steroid for more than 7-10 days at a time.  - Use Clotrimazole 1% twice daily for 1-2 weeks.

## 2024-07-01 ENCOUNTER — Other Ambulatory Visit: Payer: Self-pay

## 2024-07-01 ENCOUNTER — Other Ambulatory Visit: Payer: Self-pay | Admitting: Allergy

## 2024-07-01 NOTE — Progress Notes (Signed)
 Specialty Pharmacy Refill Coordination Note  Ariel Mooney is a 23 y.o. female contacted today regarding refills of specialty medication(s) Dupilumab  (DUPIXENT )   Patient requested Delivery   Delivery date: 07/10/24   Verified address: [address]   [place] [id number]   Medication will be filled on: 07/09/24   This fill date is pending response to refill request from provider. Patient is aware and if they have not received fill by intended date they must follow up with pharmacy.

## 2024-07-02 ENCOUNTER — Other Ambulatory Visit: Payer: Self-pay

## 2024-07-02 MED ORDER — DUPIXENT 300 MG/2ML ~~LOC~~ SOSY
300.0000 mg | PREFILLED_SYRINGE | SUBCUTANEOUS | 11 refills | Status: AC
  Filled 2024-07-02: qty 4, 28d supply, fill #0
  Filled 2024-07-30 – 2024-08-31 (×2): qty 4, 28d supply, fill #1
  Filled 2024-09-22: qty 4, 28d supply, fill #2

## 2024-07-09 ENCOUNTER — Other Ambulatory Visit: Payer: Self-pay

## 2024-07-30 ENCOUNTER — Other Ambulatory Visit (HOSPITAL_COMMUNITY): Payer: Self-pay

## 2024-08-03 ENCOUNTER — Other Ambulatory Visit (HOSPITAL_COMMUNITY): Payer: Self-pay

## 2024-08-05 ENCOUNTER — Other Ambulatory Visit: Payer: Self-pay

## 2024-08-31 ENCOUNTER — Other Ambulatory Visit: Payer: Self-pay

## 2024-08-31 NOTE — Progress Notes (Signed)
 Specialty Pharmacy Refill Coordination Note  Ariel Mooney is a 24 y.o. female contacted today regarding refills of specialty medication(s) Dupilumab  (Dupixent )   Patient requested Delivery   Delivery date: 09/01/24   Verified address: [address]   [place] [id number]   Medication will be filled on: 08/31/24

## 2024-09-11 ENCOUNTER — Other Ambulatory Visit: Payer: Self-pay

## 2024-09-17 ENCOUNTER — Other Ambulatory Visit: Payer: Self-pay

## 2024-09-21 ENCOUNTER — Other Ambulatory Visit (HOSPITAL_COMMUNITY): Payer: Self-pay

## 2024-09-22 ENCOUNTER — Other Ambulatory Visit: Payer: Self-pay

## 2024-09-22 ENCOUNTER — Other Ambulatory Visit: Payer: Self-pay | Admitting: Pharmacy Technician

## 2024-09-22 NOTE — Progress Notes (Signed)
 Specialty Pharmacy Refill Coordination Note  Ariel Mooney is a 24 y.o. female contacted today regarding refills of specialty medication(s) Dupilumab  (Dupixent )   Patient requested Delivery   Delivery date: 09/24/24   Verified address: [address]   [place] [id number]   Medication will be filled on: 09/23/24

## 2024-09-23 ENCOUNTER — Other Ambulatory Visit: Payer: Self-pay

## 2024-12-04 ENCOUNTER — Ambulatory Visit: Admitting: Internal Medicine

## 2024-12-09 ENCOUNTER — Ambulatory Visit: Admitting: Allergy
